# Patient Record
Sex: Male | Born: 1960 | Race: White | Hispanic: No | Marital: Married | Smoking: Former smoker
Health system: Southern US, Community
[De-identification: ages and names within clinical notes are randomized; demographics above are authoritative.]

## PROBLEM LIST (undated history)

## (undated) DIAGNOSIS — Z87442 Personal history of urinary calculi: Secondary | ICD-10-CM

## (undated) DIAGNOSIS — M1990 Unspecified osteoarthritis, unspecified site: Secondary | ICD-10-CM

## (undated) DIAGNOSIS — K5900 Constipation, unspecified: Secondary | ICD-10-CM

## (undated) DIAGNOSIS — K5090 Crohn's disease, unspecified, without complications: Secondary | ICD-10-CM

## (undated) DIAGNOSIS — Z8719 Personal history of other diseases of the digestive system: Secondary | ICD-10-CM

## (undated) DIAGNOSIS — I10 Essential (primary) hypertension: Secondary | ICD-10-CM

## (undated) DIAGNOSIS — K625 Hemorrhage of anus and rectum: Secondary | ICD-10-CM

## (undated) DIAGNOSIS — K589 Irritable bowel syndrome without diarrhea: Secondary | ICD-10-CM

## (undated) DIAGNOSIS — K529 Noninfective gastroenteritis and colitis, unspecified: Secondary | ICD-10-CM

## (undated) DIAGNOSIS — J189 Pneumonia, unspecified organism: Secondary | ICD-10-CM

## (undated) DIAGNOSIS — E119 Type 2 diabetes mellitus without complications: Secondary | ICD-10-CM

## (undated) DIAGNOSIS — K624 Stenosis of anus and rectum: Secondary | ICD-10-CM

## (undated) DIAGNOSIS — I209 Angina pectoris, unspecified: Secondary | ICD-10-CM

## (undated) HISTORY — DX: Noninfective gastroenteritis and colitis, unspecified: K52.9

## (undated) HISTORY — DX: Stenosis of anus and rectum: K62.4

## (undated) HISTORY — DX: Hemorrhage of anus and rectum: K62.5

## (undated) HISTORY — PX: NECK SURGERY: SHX720

## (undated) HISTORY — PX: CARDIAC CATHETERIZATION: SHX172

## (undated) HISTORY — DX: Irritable bowel syndrome, unspecified: K58.9

## (undated) HISTORY — PX: BACK SURGERY: SHX140

## (undated) HISTORY — PX: JOINT REPLACEMENT: SHX530

## (undated) HISTORY — DX: Constipation, unspecified: K59.00

## (undated) HISTORY — DX: Crohn's disease, unspecified, without complications: K50.90

---

## 1991-04-12 DIAGNOSIS — K5090 Crohn's disease, unspecified, without complications: Secondary | ICD-10-CM

## 1991-04-12 HISTORY — DX: Crohn's disease, unspecified, without complications: K50.90

## 1998-04-11 HISTORY — PX: BACK SURGERY: SHX140

## 1998-05-26 ENCOUNTER — Encounter: Payer: Self-pay | Admitting: Neurosurgery

## 1998-05-28 ENCOUNTER — Inpatient Hospital Stay (HOSPITAL_COMMUNITY): Admission: RE | Admit: 1998-05-28 | Discharge: 1998-05-29 | Payer: Self-pay | Admitting: Neurosurgery

## 1998-05-28 ENCOUNTER — Encounter: Payer: Self-pay | Admitting: Neurosurgery

## 2002-06-23 ENCOUNTER — Ambulatory Visit (HOSPITAL_COMMUNITY): Admission: RE | Admit: 2002-06-23 | Discharge: 2002-06-23 | Payer: Self-pay | Admitting: *Deleted

## 2002-06-23 ENCOUNTER — Encounter: Payer: Self-pay | Admitting: *Deleted

## 2003-01-20 ENCOUNTER — Emergency Department (HOSPITAL_COMMUNITY): Admission: EM | Admit: 2003-01-20 | Discharge: 2003-01-20 | Payer: Self-pay | Admitting: Emergency Medicine

## 2003-04-03 ENCOUNTER — Inpatient Hospital Stay (HOSPITAL_COMMUNITY): Admission: RE | Admit: 2003-04-03 | Discharge: 2003-04-04 | Payer: Self-pay | Admitting: Neurosurgery

## 2003-04-12 HISTORY — PX: NECK SURGERY: SHX720

## 2004-05-12 HISTORY — PX: COLONOSCOPY: SHX174

## 2004-05-31 ENCOUNTER — Encounter (INDEPENDENT_AMBULATORY_CARE_PROVIDER_SITE_OTHER): Payer: Self-pay | Admitting: Specialist

## 2004-05-31 ENCOUNTER — Ambulatory Visit (HOSPITAL_COMMUNITY): Admission: RE | Admit: 2004-05-31 | Discharge: 2004-05-31 | Payer: Self-pay | Admitting: Gastroenterology

## 2005-11-14 ENCOUNTER — Ambulatory Visit: Payer: Self-pay | Admitting: Internal Medicine

## 2006-06-07 ENCOUNTER — Ambulatory Visit: Payer: Self-pay | Admitting: Internal Medicine

## 2007-02-19 ENCOUNTER — Ambulatory Visit: Payer: Self-pay | Admitting: Cardiology

## 2007-03-20 ENCOUNTER — Ambulatory Visit: Payer: Self-pay

## 2007-03-20 ENCOUNTER — Ambulatory Visit: Payer: Self-pay | Admitting: Cardiology

## 2007-06-06 ENCOUNTER — Ambulatory Visit: Payer: Self-pay | Admitting: Internal Medicine

## 2008-04-11 HISTORY — PX: ABDOMINAL HERNIA REPAIR: SHX539

## 2008-04-11 HISTORY — PX: KNEE SURGERY: SHX244

## 2008-06-26 DIAGNOSIS — K5090 Crohn's disease, unspecified, without complications: Secondary | ICD-10-CM | POA: Insufficient documentation

## 2008-06-26 DIAGNOSIS — K5900 Constipation, unspecified: Secondary | ICD-10-CM | POA: Insufficient documentation

## 2008-06-26 DIAGNOSIS — Z8719 Personal history of other diseases of the digestive system: Secondary | ICD-10-CM | POA: Insufficient documentation

## 2008-06-26 DIAGNOSIS — K624 Stenosis of anus and rectum: Secondary | ICD-10-CM | POA: Insufficient documentation

## 2008-06-26 DIAGNOSIS — K625 Hemorrhage of anus and rectum: Secondary | ICD-10-CM | POA: Insufficient documentation

## 2008-06-26 DIAGNOSIS — K5289 Other specified noninfective gastroenteritis and colitis: Secondary | ICD-10-CM | POA: Insufficient documentation

## 2008-06-27 ENCOUNTER — Ambulatory Visit: Payer: Self-pay | Admitting: Internal Medicine

## 2008-06-30 ENCOUNTER — Encounter: Payer: Self-pay | Admitting: Internal Medicine

## 2008-07-15 ENCOUNTER — Telehealth (INDEPENDENT_AMBULATORY_CARE_PROVIDER_SITE_OTHER): Payer: Self-pay

## 2008-07-18 ENCOUNTER — Telehealth (INDEPENDENT_AMBULATORY_CARE_PROVIDER_SITE_OTHER): Payer: Self-pay | Admitting: *Deleted

## 2008-07-28 ENCOUNTER — Encounter: Admission: RE | Admit: 2008-07-28 | Discharge: 2008-10-08 | Payer: Self-pay | Admitting: Orthopedic Surgery

## 2008-08-13 ENCOUNTER — Encounter: Payer: Self-pay | Admitting: Internal Medicine

## 2008-08-20 ENCOUNTER — Ambulatory Visit: Payer: Self-pay | Admitting: Internal Medicine

## 2008-08-20 ENCOUNTER — Encounter: Payer: Self-pay | Admitting: Internal Medicine

## 2008-08-20 ENCOUNTER — Ambulatory Visit (HOSPITAL_COMMUNITY): Admission: RE | Admit: 2008-08-20 | Discharge: 2008-08-20 | Payer: Self-pay | Admitting: Internal Medicine

## 2008-08-20 HISTORY — PX: COLONOSCOPY: SHX174

## 2008-08-25 ENCOUNTER — Encounter: Payer: Self-pay | Admitting: Internal Medicine

## 2008-08-26 ENCOUNTER — Encounter: Payer: Self-pay | Admitting: Internal Medicine

## 2008-08-26 LAB — CONVERTED CEMR LAB
BUN: 9 mg/dL (ref 6–23)
Basophils Absolute: 0 10*3/uL (ref 0.0–0.1)
Basophils Relative: 0 % (ref 0–1)
CO2: 26 meq/L (ref 19–32)
Calcium: 9.3 mg/dL (ref 8.4–10.5)
Chloride: 105 meq/L (ref 96–112)
Creatinine, Ser: 0.87 mg/dL (ref 0.40–1.50)
Eosinophils Absolute: 0.1 10*3/uL (ref 0.0–0.7)
Eosinophils Relative: 1 % (ref 0–5)
Glucose, Bld: 78 mg/dL (ref 70–99)
HCT: 43.7 % (ref 39.0–52.0)
Hemoglobin: 14.4 g/dL (ref 13.0–17.0)
Lymphocytes Relative: 27 % (ref 12–46)
Lymphs Abs: 1.9 10*3/uL (ref 0.7–4.0)
MCHC: 33 g/dL (ref 30.0–36.0)
MCV: 89.4 fL (ref 78.0–100.0)
Monocytes Absolute: 0.7 10*3/uL (ref 0.1–1.0)
Monocytes Relative: 10 % (ref 3–12)
Neutro Abs: 4.3 10*3/uL (ref 1.7–7.7)
Neutrophils Relative %: 61 % (ref 43–77)
Platelets: 273 10*3/uL (ref 150–400)
Potassium: 4.4 meq/L (ref 3.5–5.3)
RBC: 4.89 M/uL (ref 4.22–5.81)
RDW: 12.9 % (ref 11.5–15.5)
Sodium: 143 meq/L (ref 135–145)
WBC: 7 10*3/uL (ref 4.0–10.5)

## 2008-09-18 ENCOUNTER — Encounter: Payer: Self-pay | Admitting: Internal Medicine

## 2008-10-22 ENCOUNTER — Ambulatory Visit: Payer: Self-pay | Admitting: Internal Medicine

## 2008-10-24 ENCOUNTER — Encounter: Payer: Self-pay | Admitting: Gastroenterology

## 2009-02-11 ENCOUNTER — Ambulatory Visit (HOSPITAL_COMMUNITY): Admission: RE | Admit: 2009-02-11 | Discharge: 2009-02-11 | Payer: Self-pay | Admitting: General Surgery

## 2009-05-13 ENCOUNTER — Ambulatory Visit: Payer: Self-pay | Admitting: Internal Medicine

## 2009-06-17 ENCOUNTER — Ambulatory Visit: Payer: Self-pay | Admitting: Internal Medicine

## 2009-09-01 ENCOUNTER — Ambulatory Visit: Payer: Self-pay | Admitting: Internal Medicine

## 2009-09-03 ENCOUNTER — Encounter: Payer: Self-pay | Admitting: Internal Medicine

## 2010-01-27 ENCOUNTER — Encounter (INDEPENDENT_AMBULATORY_CARE_PROVIDER_SITE_OTHER): Payer: Self-pay

## 2010-03-09 ENCOUNTER — Ambulatory Visit: Payer: Self-pay | Admitting: Cardiology

## 2010-03-18 ENCOUNTER — Ambulatory Visit: Payer: Self-pay | Admitting: Internal Medicine

## 2010-03-19 ENCOUNTER — Encounter: Payer: Self-pay | Admitting: Gastroenterology

## 2010-05-11 ENCOUNTER — Encounter (INDEPENDENT_AMBULATORY_CARE_PROVIDER_SITE_OTHER): Payer: Self-pay | Admitting: *Deleted

## 2010-05-13 NOTE — Assessment & Plan Note (Signed)
Summary: 6 MONTH F/U CROHNS/LAW   Visit Type:  Follow-up Visit Primary Care Provider:  Octavio Graves  Chief Complaint:  F/U crohns.  History of Present Illness: 50 year old gentleman with Crohn's colitis here for 6 month followup. He's done well having 4-5 formed bowel movements daily on lialda 4.8 g daily. He has history of anal stenosis that has not been an issue. He feels he is doing very well. Has not used MiraLax or any other laxative over the past 6 months. No blood per rectum. No bowel pain and nausea or vomiting. Had a problem with elevated blood pressure 2 weeks ago with what sounds like mental status changes which led to a three-day hospitalization at [facility]. He has not had any recent labs here; will retrieve labs from hospitalization.   Current Medications (verified): 1)  Percocet 5-325 Mg Tabs (Oxycodone-Acetaminophen) .... Take As Needed 2)  Miralax  Powd (Polyethylene Glycol 3350) .... As Needed 3)  Lialda 1.2 Gm Tbec (Mesalamine) .... Four Tablets in The Am 4)  Lisinopril 20 Mg Tabs (Lisinopril) .... Take 1 Tablet By Mouth Once A Day  Allergies (verified): No Known Drug Allergies  Past History:  Past Medical History: Last updated: 06/26/2008 IRRITABLE BOWEL SYNDROME, HX OF (ICD-V12.79) COLITIS (ICD-558.9) RECTAL BLEEDING (ICD-569.3) UNSPECIFIED CONSTIPATION (ICD-564.00) ANAL STENOSIS (ICD-569.2) CROHN'S DISEASE (ICD-555.9)  Past Surgical History: Last updated: 05/13/2009 Back surgery Neck surgery Abdominal hernia   2010 Left knee Artrhoscopic surg  2010  Family History: Last updated: 06/27/2008 Father: living / CHF Mother: living /CHF and DM Siblings: 4 One brother deceased   Social History: Last updated: 06/27/2008 Marital Status: Married Children: 1 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  Alcohol Use - yes  Risk Factors: Smoking Status: quit (06/27/2008)  Vital Signs:  Patient profile:   50 year old male Height:       72 inches Weight:      293 pounds BMI:     39.88 Temp:     98.4 degrees F oral Pulse rate:   72 / minute BP sitting:   120 / 78  (left arm) Cuff size:   large  Vitals Entered By: Waldon Merl LPN (March 19, 6807 4:08 PM)  Physical Exam  General:  pleasant gentleman resting comfortably in no acute distress Lungs:  clear to auscultation  Heart:  rate rhythm without murmur gallop or Abdomen:  obese.nondistended positive bowel sounds soft nontender without appreciable mass or organomegaly  Impression & Recommendations: Impression: Patient with Crohn's colitis in remission on off label use of lialda. Anal stenosis asymptomatic. Overall I feel the patient is doing very well with his current regimen.  Recommendations: Continue lialda for 4.8 g daily. I would like to retrieve the labs done when he was hospitalized recently just to make sure that things are in line including serum creatinine. Assuming everything is okay, we'll plan to see him back in the office in one year.  Appended Document: Orders Update    Clinical Lists Changes  Orders: Added new Service order of Est. Patient Level III (81103) - Signed

## 2010-05-13 NOTE — Letter (Signed)
Summary: NOTES FROM PATIENT  NOTES FROM PATIENT   Imported By: Diana Eves 06/17/2009 16:32:00  _____________________________________________________________________  External Attachment:    Type:   Image     Comment:   External Document

## 2010-05-13 NOTE — Assessment & Plan Note (Signed)
Summary: fu ov 6 mo,crohn's disease/ams   Visit Type:  Follow-up Visit Primary Care Provider:  Samuel Jester  Chief Complaint:  F/U crohn's.  History of Present Illness:  50 year old gentleman with prior colonoscopy demonstrated the ileocecal valve/ileal ulcers in distal rectal inflammation suspicious for Crohn's disease. He was started on lialda last year but is come off that medication for takes Percocet periodically for his back.  He has become progressively constipated. Denies any blood per rectum or any diarrhea, whatsoever. He has a history of anal stenosis and hast dilator at home a physician gave him over at [facility]; he wonders if he doesn't need another dilation at this time.  had umilical hernia repaired by Dr. Lovell Sheehan since he wa last here.   Current Medications (verified): 1)  Percocet 5-325 Mg Tabs (Oxycodone-Acetaminophen) .... 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  Alcohol Use - yes  Past Surgical History: Back surgery Neck surgery Abdominal hernia   2010 Left knee Artrhoscopic surg  2010  Vital Signs:  Patient profile:   50 year old male Height:      72 inches Weight:      272 pounds BMI:     37.02 Temp:     98.2 degrees F oral Pulse rate:   80 / minute BP sitting:   118 / 62  (right arm) Cuff size:   regular  Vitals Entered By: Cloria Spring LPN (May 13, 2009 3:46 PM)  Physical Exam  General:  alert  /conversant and comfortable-appearing Eyes:  no scleral icterus. Lungs:  clear to auscultation Heart:  regular rate and rhythm without murmur gallop rub Abdomen:  nondistended positive bowel sounds well-healing umbilical hernia repair site identified. Soft nontender without appreciable mass or organomegaly Rectal:  no external lesions. He does have a relatively tight anal canal and digital exam was uncomfortable. No mass rectal vault scant brown stool present  Impression & Recommendations: 50 year old gentleman with endoscopic and histologic evidence concerning for Crohn's disease, currently on no anti-inflammatory therapy. His chief complaint is that of constipation. He does have some degree of anal stenosis. I am more concerned about his use of Percocet could be constipation having a bowel obstruction. Findings of the prior colonoscopy last year otherwise reassuring.   At this time, recommended he go on a laxative in the way of MiraLax one capful or 17 g orally at bedtime in 8 ounces of water. He's to do this nightly p.r.n. no bowel movement on any given day. Furthermore,  he is to  keep a stool diary. Will hold off on re\re instituting lialda for the time being. We'll have him come in to see [place] in one month; we'll reassess him and discuss  further management.  Other Orders: Est. Patient Level III (04540)

## 2010-05-13 NOTE — Miscellaneous (Signed)
Summary: Orders Update  Clinical Lists Changes  Orders: Added new Test order of T-Basic Metabolic Panel ([id number]) - Signed  

## 2010-05-13 NOTE — Letter (Signed)
Summary: Recall, Labs Needed  [facility]  [address]   [place], [place] [address]   Phone: [phone]  Fax: [phone]    January 27, 2010  Ricky Blackwell [address] [place], [place]  [address] May 08, 1960   Dear [name],   [place] records indicate it is time to repeat your blood work.  You can take the enclosed form to the lab on or near the date indicated.  Please make note of the new location of the lab:   [place], 2nd floor   [place] office will call you within a week to ten business days with the results.  If you do not hear from [place] in 10 business days, you should call the office.  If you have any questions regarding this, call the office at [phone], and ask for the nurse.  Labs are due on 03/02/2010.   Sincerely,    Burnadette Peter LPN  [facility] Ph: [phone]   Fax: [phone]

## 2010-05-13 NOTE — Medication Information (Signed)
Summary: Theodosia Blender 1.2GM  LIALDA 1.2GM   Imported By: Rexene Alberts 03/19/2010 15:35:03  _____________________________________________________________________  External Attachment:    Type:   Image     Comment:   External Document  Appended Document: LIALDA 1.2GM    Prescriptions: LIALDA 1.2 GM TBEC (MESALAMINE) Four tablets in the AM  #120 x 3   Entered and Authorized by:   Gerrit Halls NP   Signed by:   Gerrit Halls NP on 03/22/2010   Method used:   Faxed to ...       [facility]* (retail)       [address]       [place], [place]  [address]       Ph: [phone]       Fax: [phone]   RxID:   [id number]

## 2010-05-13 NOTE — Assessment & Plan Note (Signed)
Summary: follow up -cdg   Visit Type:  Follow-up Visit Primary Care Provider:  Aram Beecham butler  Chief Complaint:  F/U .  History of Present Illness: 50 year old gentleman with ileocolonic Crohn's disease and some proctitis return for followup. He has a history of anal stenosis. He did not feel he was evacuating adequately when seen previously. we bumped up Lialda to the full dose  - i.e. 4.8 g daily. This has been associated with normalization of bowel function;  still has upwards of 5 BM's daily. Not passing any blood; he feels he's  is doing well and is very happy. He is not having any abdominal pain. His creatinine was normal on assay last year.    Current Medications (verified): 1)  Percocet 5-325 Mg Tabs (Oxycodone-Acetaminophen) .... Take As Needed 2)  Miralax  Powd (Polyethylene Glycol 3350) .... As Needed 3)  Lialda 1.2 Gm Tbec (Mesalamine) .... Four Tablets in The Am  Allergies (verified): No Known Drug Allergies  Past History:  Past Medical History: Last updated: 06/26/2008 IRRITABLE BOWEL SYNDROME, HX OF (ICD-V12.79) COLITIS (ICD-558.9) RECTAL BLEEDING (ICD-569.3) UNSPECIFIED CONSTIPATION (ICD-564.00) ANAL STENOSIS (ICD-569.2) CROHN'S DISEASE (ICD-555.9)  Past Surgical History: Last updated: 05/13/2009 Back surgery Neck surgery Abdominal hernia   2010 Left knee Artrhoscopic surg  2010  Family History: Last updated: 06/27/2008 Father: living / CHF Mother: living /CHF and DM Siblings: 4 One brother deceased   Social History: Last updated: 06/27/2008 Marital Status: Married Children: 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  Alcohol Use - yes  Risk Factors: Smoking Status: quit (06/27/2008)  Vital Signs:  Patient profile:   50 year old male Height:      72 inches Weight:      285 pounds BMI:     38.79 Temp:     98.0 degrees F oral Pulse rate:   80 / minute BP sitting:   130 / 70  (left arm) Cuff size:   large  Vitals  Entered By: Cloria Spring LPN (Sep 01, 2009 4:02 PM)  Physical Exam  General:  alert conversant no acute distress Abdomen:  obese positive bowel sounds soft and entirely nontender without appreciable mass or organomegaly  Impression & Recommendations: Impression: 50 year old general ileocolonic Crohn's disease. History of anal stenosis. GI symptoms had become  quiscent on full dose mesalamine therapy in the way of lialda. He's please with his progress as I am at this time.  Recommendations: Continue lialda for 4.8 g daily. Plan see him back in 6 months; we'll check a be met at that time. If he has any interim problems, he is to call.  Appended Document: Orders Update    Clinical Lists Changes  Orders: Added new Service order of Est. Patient Level III ([address]) - Signed      Appended Document: follow up -cdg reminder in computer

## 2010-05-13 NOTE — Assessment & Plan Note (Signed)
Summary: fu 42mo ov,chrons,gu   Visit Type:  Follow-up Visit Primary Care Provider:  butler  Chief Complaint:  follow up- doing ok.  History of Present Illness: 50 year old gentleman with ileocolonic Crohn's disease ( predominantly left-sided colonic) and anal stenosis. Here for followup. He's currently on no anti-inflammatory therapy for his bowels. He relates more constipation when he was here last year. Using MiraLax nightly -  has upwards on average of 6-8 bowel movements daily;  passes bowel movements the size of his pinky. He is goning w/o any  recent anal dilations because of inconvenience.   He noted when he has dilated previously,  he had larger less frequent bowel movements.  Not having any liquid bowel movements; he denies true diarrhea. There is no blood per rectum. He previously saw Dr. Byrd Hesselbach over [place] who got him the  dilator.  He has been  2-3 years since he last saw Dr. Byrd Hesselbach. I found no significant abnormality on digital rectal exam or colonoscopy aside from the inflamed mucosa consistent Crohn's last year. He is gaining weight. He has not had any upper GI tract symptoms.  I have reviewed his stool diary as outlined above.  Current Problems (verified): 1)  Irritable Bowel Syndrome, Hx of  (ICD-V12.79) 2)  Colitis  (ICD-558.9) 3)  Rectal Bleeding  (ICD-569.3) 4)  Unspecified Constipation  (ICD-564.00) 5)  Anal Stenosis  (ICD-569.2) 6)  Crohn's Disease  (ICD-555.9)  Current Medications (verified): 1)  Percocet 5-325 Mg Tabs (Oxycodone-Acetaminophen) .... Take As Needed 2)  Miralax  Powd (Polyethylene Glycol 3350) .... Once Daily  Allergies (verified): No Known Drug Allergies  Past History:  Past Medical History: Last updated: 06/26/2008 IRRITABLE BOWEL SYNDROME, HX OF (ICD-V12.79) COLITIS (ICD-558.9) RECTAL BLEEDING (ICD-569.3) UNSPECIFIED CONSTIPATION (ICD-564.00) ANAL STENOSIS (ICD-569.2) CROHN'S DISEASE (ICD-555.9)  Past Surgical History: Last updated:  05/13/2009 Back surgery Neck surgery Abdominal hernia   2010 Left knee Artrhoscopic surg  2010  Family History: Last updated: 06/27/2008 Father: living / CHF Mother: living /CHF and DM Siblings: 4 One brother deceased   Social History: Last updated: 06/27/2008 Marital Status: Married Children: 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 Abdomen:  detaile exam deferred.  Impression & Recommendations: Impression :  Ileocolonic Crohn's disease with an element of proctitis. Currently not on any anti-inflammatory therapy. He previously was on lialda. He has a history of anal stenosis.  He has multiple bowel movements daily taking MiraLax at bedtime. I'm not sure he truly has much in the way any diarrhea at  this time.  Symtpoms somewhat difficult to sort as far as the degree of any inflammatory bowel disease activity  symptoms versus  relative outlet obstruction symptoms.  I feel he should be on a good dose of anti-inflammatory therapy to squelch any IBD  symptoms he may be currently having. He may well be taking too much  MiraLax . He may need to see Dr. Byrd Hesselbach again regarding his anal stenosis.  I'd like to optimize treatment for  his inflammatory bowel disease before going in that direction.   Recommendations:   resume Lialda at a full dose of 4.8 g orally daily; he is to continue  keeping a  stool diarrhea. Would decrease / minimize use of MiraLax - would try to avoid MiraLax, in fact, if he has at  least couple bowel movements daily. Plan to see him back in 6 weeks and we'll make a decision about getting back or to see Dr. Mirian Mo at [facility].  Appended Document: Orders Update    Clinical Lists Changes  Orders: Added new Service order of Est. Patient Level III ([address]) - Signed

## 2010-05-19 NOTE — Letter (Signed)
Summary: Recall Office Visit  [facility]  [address]   [place], [place] [address]   Phone: [phone]  Fax: [phone]      May 11, 2010   Ricky Blackwell [address] [place], [place]  [address] August 04, 1960   Dear [name],   According to our records, it is time for you to schedule a follow-up office visit with [place].   At your convenience, please call [phone] to schedule an office visit. If you have any questions, concerns, or feel that this letter is in error, we would appreciate your call.   Sincerely,    Diana Eves  [facility] Ph: [phone]   Fax: [phone]

## 2010-07-14 LAB — BASIC METABOLIC PANEL
CO2: 27 mEq/L (ref 19–32)
Calcium: 8.9 mg/dL (ref 8.4–10.5)
Creatinine, Ser: 0.65 mg/dL (ref 0.4–1.5)
GFR calc Af Amer: >60 mL/min (ref >60)
GFR calc non Af Amer: >60 mL/min (ref >60)

## 2010-07-14 LAB — CBC
MCHC: 34.2 g/dL (ref 30.0–36.0)
RBC: 4.62 MIL/uL (ref 4.22–5.81)

## 2010-07-20 ENCOUNTER — Other Ambulatory Visit: Payer: Self-pay

## 2010-07-21 MED ORDER — MESALAMINE 1.2 G PO TBEC: 1.2000 g | DELAYED_RELEASE_TABLET | Freq: Four times a day (QID) | ORAL | Status: DC

## 2010-07-21 NOTE — Telephone Encounter (Signed)
Rx called to [facility] in [place].

## 2010-08-24 NOTE — Op Note (Signed)
NAME:  OAK, DOREY NO.:  [id number]   MEDICAL RECORD NO.:  [id number]          PATIENT TYPE:  AMB   LOCATION:  DAY                           FACILITY:  [facility]   PHYSICIAN:  R. Garfield Cornea, M.D. DATE OF BIRTH:  May 23, 1960   DATE OF PROCEDURE:  08/20/2008  DATE OF DISCHARGE:                               OPERATIVE REPORT   PROCEDURE:  Ileocolonoscopy with biopsy.   INDICATIONS FOR PROCEDURE:  This 6-year gentleman carries a diagnosis  of Crohn's colitis, based on prior colonoscopies done with [facility] in [place].  His last exam was 2006, done by Teena Irani.  He describes some abnormalities in the left colon.  Biopsies  were done, but in spite of our best efforts, we never got any path  report.  He describes some abnormalities in the distal transverse colon  and some in the descending segment.  Mr. Shular has 5-6 bowel movements  daily.  He denies blood per rectum.  He has been on Colazal along the  way, but stopped taking this agent a couple of months ago because he  felt it was constipating him.  No family history of GI neoplasia.  He is  currently taking note IBD meds at this time.  Colonoscopy is now being  done.  Risks, benefits, alternatives and limitations have been  discussed, questions answered.  Please see documentation in the medical  record.   PROCEDURE NOTE:  O2 saturation, blood pressure, pulse, respiration were  monitored throughout entire procedure.   CONSCIOUS SEDATION:  Versed 5 mg IV, Demerol 125 mg IV, in divided  doses.   INSTRUMENT:  Pentax video chip system.   FINDINGS:  Digital rectal exam revealed no abnormalities on scout  findings.  The prep was adequate.  Colon:  Colonic mucosa was surveyed  from the rectosigmoid junction through the left transverse, right colon  to the appendiceal orifice, ileocecal valve, cecum.  These structures  were well-seen, photographed for the record.  Terminal ileum was  intubated to  5 cm.  From this level scope was slowly and cautiously  withdrawn.  All previously-mentioned mucosal surfaces were again seen.  The patient was noted to have a couple of small aphthous type ulcers on  the lips of the ileocecal valve.  The distal terminal ileum otherwise  appeared entirely normal.  This area was biopsied.  The ascending  segment appeared entirely normal.  The transverse segment appeared  normal.  Examination of the descending segment revealed normal-appearing  mucosa.  I elected to go ahead and do segmental biopsies in this  segment, pulled the scope on down into the sigmoid.  The sigmoid mucosa  appeared normal until the distal sigmoid was reached.  There were  scattered 1-2 mm aphthous-appearing ulcers in the distal sigmoid, which  extended on down to the proximal rectum.  These areas of ulceration were  discrete and the vast majority of the colonic mucosa appeared entirely  normal.  The aphthous ulcers were clustered in the distal sigmoid and  proximal rectum.  The distal rectum appeared entirely normal with  thorough examination, including retroflexion.  Biopsies of the sigmoid  and proximal rectal mucosa were taken for histologic study, as well.  Again, the small ulcers at the ileocecal valve were biopsied for  histologic study.   The patient tolerated the procedure well and was reacted in endoscopy.  Cecal withdrawal time 18 minutes.   IMPRESSION:  Normal distal rectum.  Proximal rectum, scattered 1-2-mm  aphthous ulcers.  These extended into the distal sigmoid.  The colonic  mucosa from the mid-sigmoid all the way around to the cecum appeared  entirely normal.  There were some small aphthous ulcerations at the  mouth of the ileocecal valve as described above.  Further up, the distal  terminal ileal mucosa appeared normal, status post segmental biopsies as  described above.   RECOMMENDATIONS:  We will follow up on path and make further  recommendations in the  very near future.      Bridgette Habermann, M.D.  Electronically Signed     RMR/MEDQ  D:  08/20/2008  T:  08/20/2008  Job:  [id number]   cc:   Octavio Graves  Fax: [phone]

## 2010-08-24 NOTE — Assessment & Plan Note (Signed)
NAME:  Ricky Blackwell, Ricky Blackwell                CHART#:  [id number]   DATE:  06/06/2007                       DOB:  October 07, 1960   Today's followup Crohn's colitis by history.   The patient returns for 1-year followup.  Last seen 06/07/2006.  This  gentleman gives a history of Crohn's colitis going back to 1994 when he  was diagnosed by Dr. Clarene Essex.  Had subsequent followup colonoscopy by  Dr. Amedeo Plenty around 2006.  On 2 different occasions, we attempted to get  records from the folks down in [place], but have not gotten 1 piece  of paper yet to corroborate the diagnosis of Crohn's disease.  No path,  et Ronney Asters.  The patient has been on Colazal 2.25 g t.i.d. now for some  time, and he tells me he has greater than 90% good days in any given  month.  He has 5 to 6 formed bowel movements daily on good days, 10-15  on bad days with diarrhea.  He has not had any blood per rectum.  Has  not had any significant abdominal pain.  He denies odynophagia,  dysphagia, early satiety, or reflux symptoms, nausea or vomiting.  He  has been able to lose some weight.  He has dropped 19 pounds since  06/07/2006, adopting a healthy lifestyle and exercise program.  There is  no family history of GI neoplasia.   CURRENT MEDICATIONS:  See updated list.   ALLERGIES:  No known drug allergies.   EXAM:  He looks well.  Weight is 266, height 6 feet, temperature 98, BP 114/82, pulse 80.  SKIN:  Warm and dry.  CHEST:  Lungs clear to auscultation.  CARDIAC:  Regular rate and rhythm without murmur, gallop, or rub.  ABDOMEN:  Nondistended.  Positive bowel sounds.  Soft, nontender.  No  masses or hepatosplenomegaly.   ASSESSMENT:  Crohn's colitis by history.  I have no corroborating data  to support the diagnosis.  I certainly have no doubt of diagnosis at  this time, but I would very much like to see results of studies done  previously along with path.  We did ask the patient to get a BMET one  year ago, but he did not  follow through.   RECOMMENDATIONS:  Continue Colazal for the time-being.  Will do  everything we can to get the records in the very near future for review.  Will get a CBC and a BMET now.  Will make further recommendations in the  very near future.       Bridgette Habermann, M.D.  Electronically Signed     RMR/MEDQ  D:  06/06/2007  T:  06/07/2007  Job:  [id number]   cc:   Octavio Graves

## 2010-08-24 NOTE — Assessment & Plan Note (Signed)
[facility] OFFICE NOTE   NAME:Ricky Blackwell, Ricky Blackwell                      MRN:          [id number]  DATE:02/19/2007                            DOB:          [birth year]    REFERRING PHYSICIAN:  Haynes Hoehn, NP   PRIMARY CARE PHYSICIAN:  Dr. Morrie Sheldon.   REFERRING PHYSICIAN:  Nicanor Bake , NP.   REASON FOR PRESENTATION:  A patient with chest pain.   HISTORY OF PRESENT ILLNESS:  The patient is a pleasant 50 year old  gentleman without prior cardiac history.  He developed chest pain about  3 weeks ago.  This was an anterior discomfort.  It was kind of a heavy  discomfort.  He did notice it after picking up something heavy in his  yard.  There is no associated nausea, vomiting or diaphoresis.  It was  moderate in intensity.  He has now had radiation to his axilla that he  just noticed this weekend.  He was doing some other work, cleaning his  car.  He notices that this discomfort is reproducible with palpation.  He denies any jaw discomfort or arm discomfort.  He is active at work.  Does not bring on any of these symptoms or make the discomfort worse.  He has not been taking anything to try to make it go away.  He does get  short of breath walking stairs, but this has been slowly progressive  over time.  He is not describing any resting shortness of breath.  Denies any PND or orthopnea.  He blames the shortness of breath on his  obesity.   PAST MEDICAL HISTORY:  1. Crohn disease.  2. Nephrolithiasis.   PAST SURGICAL HISTORY:  Back surgery.   ALLERGIES:  None.   MEDICATIONS:  1. Skelaxin 800 mg b.i.d.  2. Etodolac 500 mg b.i.d.  3. Oxycodone.  4. Flexeril.  5. Balsalazide 750 mg 9 tablets daily.   SOCIAL HISTORY:  The patient works in a [employer].  He does a lot of  loading.  He is married.  He has 1 child.  He quit smoking 20 years ago  after 2 packs per day for 8 years.   FAMILY HISTORY:  Noncontributory  for early coronary disease.  Both of  his parents have heart failure at a later age.  His dad has sleep apnea.  He has a sister with sleep apnea as well.   REVIEW OF SYSTEMS:  As stated in the HPI and positive for daytime  somnolence, dizziness,  headaches.  Negative for other systems as  described.   PHYSICAL EXAMINATION:  The patient is in no distress.  Blood pressure 149/90, heart rate 65 and regular, weight 286 pounds.  HEENT:  Eyes unremarkable.  Pupils equal, round, react to light.  Fundi  not visualized.  Oral mucosa unremarkable.  NECK:  No jugular venous distention.  Waveform within normal limits.  Carotid upstroke brisk and symmetric.  No bruits.  No thyromegaly.  LYMPHATICS:  No cervical, axillary, inguinal adenopathy.  LUNGS:  Clear to auscultation bilaterally.  BACK:  No  costovertebral angle tenderness.  CHEST:  Unremarkable.  HEART:  PMI not displaced, it was sustained.  S1 and S2 within normal  limits; no S3, no S4.  No clicks, no rubs, no murmurs.  ABDOMEN:  Obese; positive bowel sounds.  Normal frequency and pitch.  No  bruits, rebound, guarding.  No midline pulsatile mass.  No hepatomegaly,  splenomegaly.  SKIN:  No rashes, no nodules.  EXTREMITIES:  2+ pulses throughout.  No edema, no cyanosis, no clubbing.  NEURO:  Oriented to person, place and time.  Cranial nerves II-XII  grossly intact.  Motor grossly intact.   HISTORY OF PRESENT ILLNESS:  1. The patient's chest discomfort is predominantly atypical.  He has      cardiovascular risk factors.  I think it is a low pre-test      probability for obstructive coronary disease.  We do think he needs      an exercise treadmill test.  This will also allow me to judge the      level of dyspnea, as well as his blood pressure response with      activity.  Further evaluation of his dyspnea will be based on these      results.  2. Hypertension.  The patient has a mildly elevated blood pressure.      This is the first time  I am seeing him and the first time he is      being told this.  Certainly he needs therapeutic lifestyle changes.      We will judge his blood pressure response with the treadmill.  He      may well have sleep apnea.  He has all of the symptoms.  I am going      to go ahead and order a sleep study.  3. Obesity.  He understands he needs to lose weight with diet and      exercise, and we will review this.  4. Risk reduction.  I will check to see if he has had a lipid profile.  5. Followup will be at the time of his treadmill.     Minus Breeding, MD, [facility]  Electronically Signed    JH/MedQ  DD: 02/19/2007  DT: 02/20/2007  Job #: [id number]   cc:   Haynes Hoehn, NP

## 2010-08-24 NOTE — Procedures (Signed)
[facility]                              EXERCISE TREADMILL   NAME:Ricky Blackwell, Ricky Blackwell                      MRN:          [id number]  DATE:03/20/2007                            DOB:          1960/08/31    REFERRING PHYSICIAN:  Laurance Flatten, M.D.   PROCEDURE:  Exercise treadmill test.   INDICATIONS:  Evaluate patient with chest pain and cardiovascular risk  factors.   PROCEDURE NOTE:  The patient was exercised using standard [facility]  protocol. He was able to exercise for 9 minutes which completed stage 3.  He achieved 10.4 METS. He achieved a peak heart rate of 160 which was  91% of predicted. He had an appropriate blood pressure response with a  maximum 195/78. Test was terminated because he achieved his target heart  rate. He had no chest pain. There were occasional premature ectopic  complexes at peak exercise but none in recovery. There were no ischemic  ST-T wave changes. He had an appropriate heart rate recovery.   CONCLUSION:  Negative adequate exercise treadmill test.   PLAN:  Based on the above, the patient does not have evidence of high-  grade obstructive coronary disease. He has a moderate exercise  tolerance. I gave him a prescription for exercise. I put in the low-risk  category for further cardiovascular events based on this finding along.  He certainly needs to exercise and manage his morbid obesity. He needs  follow up of his blood pressure. We are going to try to make an argument  for him to get a sleep study as his insurance company would only cover  40% of this.     Minus Breeding, MD, [facility]  Electronically Signed    JH/MedQ  DD: 03/20/2007  DT: 03/21/2007  Job #: [id number]   cc:   Chipper Herb, M.D.

## 2010-08-27 NOTE — Op Note (Signed)
NAME:  CORINTHIAN, MIZRAHI NO.:  [id number]   MEDICAL RECORD NO.:  [id number]                   PATIENT TYPE:  INP   LOCATION:  9675                                 FACILITY:  [facility]   PHYSICIAN:  Hosie Spangle, M.D.            DATE OF BIRTH:  Oct 19, 1960   DATE OF PROCEDURE:  04/03/2003  DATE OF DISCHARGE:  04/04/2003                                 OPERATIVE REPORT   PREOPERATIVE DIAGNOSIS:  C5-6 and C6-7 cervical disk herniation, cervical  spondylosis, cervical degenerative disk disease and cervical radiculopathy.   POSTOPERATIVE DIAGNOSIS:  C5-6 and C6-7 cervical disk herniation, cervical  spondylosis, cervical degenerative disk disease and cervical radiculopathy.   PROCEDURE:  C5-6 and C6-7 anterior cervical diskectomy and arthrodesis with  iliac crest allograft and tether cervical plating.   SURGEON:  Hosie Spangle, M.D.   ASSISTANT:  Leeroy Cha, M.D.   ANESTHESIA:  General endotracheal anesthesia.   INDICATIONS FOR PROCEDURE:  The patient is a 50 year old man who presented  with a left cervical radiculopathy and was found to have disk herniation  which extended to the length of both C5-6 and C6-7, superimposed upon  underlying cervical spondylosis and degenerative disk disease. The decision  was made to proceed with a 2-level anterior cervical diskectomy and  arthrodesis.   DESCRIPTION OF PROCEDURE:  The patient was brought to the operating room and  placed under general anesthesia. The patient was placed in 10 pounds of  halter traction and the neck was prepped with Betadine soap and solution and  draped in a sterile fashion. The line of the incision was treated with local  anesthesia with epinephrine.   A horizontal incision was made in the left side of the neck. Dissection was  carried down through the subcutaneous tissue and platysma and then  dissection was carried out through an avascular plane and in the  sternocleidomastoid, carotid artery and jugular vein were retracted  laterally and the trachea and esophagus medially. The ventral aspect of the  ventral aspect of the vertebral column was identified and localizing x-rays  were taken. The C5-6 and the C6-7 intravertebral disk space was identified.  An x-ray was taken to confirm the localization.   The annulus at each level was incised. The disk space was entered and the  diskectomy was performed using microcurets and pituitary rongeurs. The  cauterized the endplates of corresponding vertebra were removed using  microcurets along with the Stryker drill. The microscope was draped and  brought into the field to provide its magnification, illumination and  visualization, and the remainder of the decompression was performed using  microdissection and microsurgical technique.   Posterior osteophytic overgrowth at each level was removed using the Stryker  drill along with a 2-mm punch for the thin footplate. The posterior  longitudinal ligament  was carefully  opened and a disk herniation  encountered at each level and removed. Spondylitically thickened  posterior  longitudinal ligament  was opened and the spinal canal,  thecal sac,  foramina and nerve roots were decompressed bilaterally at each level.   Once the decompression was completed, hemostasis was established with the  use of Gelfoam soaked in thrombin. We then measured the height of each of  the intravertebral disk spaces with bone spacers as well as 7-mm grafts for  each level. These were hydrated in saline solution and positioned in the  intervertebral disk space and countersunk.   We then selected a 32-mm tether cervical plate. It was positioned over the  fusion construct and secured to the vertebra with 4 x 15 mm screws. We  placed a pair of C5 and another pair at C7 and a single screw at C6. Each  screw hole was drilled and tapped and a screw placed. Screws were placed in  an  alternating fashion. All 5 screws were placed and then final tightening  was done of all 5 screws.   An x-ray was performed. The view was somewhat limited due to his large  shoulders. The screws were in good position at C5 and C6 and the graft was  in good position at the C5-6 level. We could not see the graft at C6-7 nor  the screws at C7. However, under direct vision they appeared  in good  position. The wound was irrigated with Bacitracin solution. Successful  hemostasis was established and confirmed and then we proceeded with closure.   The platysma was closed with interrupted inverted 2-0  Vicryl undyed  sutures. The subcutaneous and subcuticular layer were closed with  interrupted inverted 3-0 undyed  Vicryl suture and the skin was  reapproximated with Dermabond.   The patient tolerated the procedure well. Estimated blood loss was 75 mL.  Following  surgery, the patient, whose traction had been discontinued once  the bone grafts were placed and prior to the plating, was placed in a soft  cervical collar, reversed from anesthetic, extubated and transferred to the  recovery room for further care where he was noted to be moving all 4  extremities to command.                                               Hosie Spangle, M.D.    RWN/MEDQ  D:  04/03/2003  T:  04/04/2003  Job:  [id number]

## 2010-08-27 NOTE — Op Note (Signed)
NAME:  Ricky Blackwell, BONSIGNORE NO.:  [id number]   MEDICAL RECORD NO.:  [id number]          PATIENT TYPE:  AMB   LOCATION:  ENDO                         FACILITY:  [facility]   PHYSICIAN:  John C. Amedeo Plenty, M.D.    DATE OF BIRTH:  Aug 13, 1960   DATE OF PROCEDURE:  05/31/2004  DATE OF DISCHARGE:                                 OPERATIVE REPORT   PROCEDURE:  Colonoscopy with biopsy.   INDICATIONS FOR PROCEDURE:  Patient with history of inflammatory bowel  disease of the colon presumed Crohn's who has had a recent flare-up not  responsive to standard oral medical therapy.  He also has a disease duration  of at least 12 years and is due for colon cancer screening.   PROCEDURE:  The patient was placed in the left lateral decubitus position  then placed on the pulse monitor with continuous low-flow oxygen delivered  by nasal cannula. He was sedated with 75 mcg IV fentanyl and 8 mg IV Versed.  The Olympus video colonoscope was inserted into the rectum, advanced to the  cecum, confirmed by transillumination at McBurney's point and visualization  of the ileocecal valve and appendiceal orifice. The prep was excellent. The  terminal ileum was entered and explored for a few centimeters and appeared  be within normal limits. The cecum, ascending and proximal transverse colon  appeared normal. No masses, polyps, diverticula or any of visible  inflammatory changes. Beginning at about 75 cm in what was felt to be the  distal transverse colon, there was transition to hypervascularity edema,  erythema, granularity and some small aphthous ulcers.  The appearance was  most intense from about 50-60 cm presumably in the descending colon and was  less prominent distal to that within the sigmoid and rectum. There did not  appear to be actual rectal sparing, but there were areas in the distal  sigmoid and rectum that appeared nearly normal with preservation of the  vascular pattern. Biopsies were taken of  the most visibly inflamed areas in  the transverse and descending colon. There were no polyps or pseudopolyps  seen. The scope was then withdrawn and the patient returned to the recovery  room in stable condition. He tolerated the procedure well and there were no  immediate complications.  Left-sided colitis, possibly into the transverse  colon but the most intense inflammation in the descending colon.   PLAN:  Await histology to the rule out dysplasia and better ascertain  whether Crohn's or colitis and to guide further therapy.      JCH/MEDQ  D:  05/31/2004  T:  05/31/2004  Job:  [id number]

## 2011-08-23 ENCOUNTER — Other Ambulatory Visit: Payer: Self-pay

## 2011-08-23 MED ORDER — MESALAMINE 1.2 G PO TBEC: DELAYED_RELEASE_TABLET | ORAL | Status: DC

## 2011-08-23 NOTE — Telephone Encounter (Signed)
Pt needs OV

## 2011-08-23 NOTE — Telephone Encounter (Signed)
Patient needs office visit prior to further refills

## 2011-08-23 NOTE — Telephone Encounter (Signed)
Pt aware he needs an appointment before he can get anymore refills.

## 2011-09-22 ENCOUNTER — Telehealth: Payer: Self-pay | Admitting: Internal Medicine

## 2011-09-22 NOTE — Telephone Encounter (Signed)
[name] wife called to make OV and asked if he ran out of Lialda could he get a few samples to hold him over until his OV.

## 2011-09-22 NOTE — Telephone Encounter (Signed)
pts last ov was 03/18/2010. He is scheduled for an ov on 11/01/2011. Is it ok to give samples?

## 2011-09-23 NOTE — Telephone Encounter (Signed)
Tried to call pt- LMOM 

## 2011-09-23 NOTE — Telephone Encounter (Signed)
May give enough until OV. Was taking four daily.

## 2011-09-26 NOTE — Telephone Encounter (Signed)
Pt picked up samples

## 2011-09-26 NOTE — Telephone Encounter (Signed)
Tried to call pt- LMOM 

## 2011-11-01 ENCOUNTER — Ambulatory Visit (INDEPENDENT_AMBULATORY_CARE_PROVIDER_SITE_OTHER): Payer: Managed Care, Other (non HMO) | Admitting: Internal Medicine

## 2011-11-01 ENCOUNTER — Encounter: Payer: Self-pay | Admitting: Internal Medicine

## 2011-11-01 VITALS — BP 129/73 | HR 82 | Temp 97.6°F | Ht 72.0 in | Wt 296.2 lb

## 2011-11-01 DIAGNOSIS — K5010 Crohn's disease of large intestine without complications: Secondary | ICD-10-CM

## 2011-11-01 NOTE — Progress Notes (Signed)
Primary Care Physician:  Octavio Graves, DO Primary Gastroenterologist:  Dr. Gala Romney  Pre-Procedure History & Physical: HPI:  Ricky Blackwell is a 51 y.o. male here for followup of a Crohn's colitis. He is doing well with off label Lialda 4.8 g daily. 1-2 bowel movements daily no diarrhea, tenesmus or rectal bleeding. Prior colonoscopy demonstrating patchy proctocolitis with active colitis in the sigmoid. No dysplasia. Last CBC in the map was done 2 years ago they were normal. Since I last saw him, he has had back surgery for herniated disc. Overall he is doing well.  Past Medical History  Diagnosis Date  . Irritable bowel syndrome   . Colitis   . Rectal bleeding   . Unspecified constipation   . Anal stenosis   . Crohn's disease     Past Surgical History  Procedure Date  . Back surgery   . Neck surgery   . Abdominal hernia repair 2010  . Knee surgery 2010    Left Arthoscopic  . Colonoscopy 05/2004    Dr. Amedeo Plenty- [facility]- crohns   . Colonoscopy 08/20/2008    Dr. Gala Romney- normal distal rectum, proximal rectum, scattered 1-2 mm aphthous ulcers, these extended into the distal sigmoid.    Prior to Admission medications   Medication Sig Start Date End Date Taking? Authorizing Provider  mesalamine (LIALDA) 1.2 G EC tablet Take 4 tablets every morning 08/23/11  Yes Andria Meuse, NP  mesalamine (LIALDA) 1.2 G EC tablet Take 1.2 g by mouth 4 (four) times daily.  11/01/11 Yes Orvil Feil, NP  oxyCODONE-acetaminophen (PERCOCET) 5-325 MG per tablet Take 1 tablet by mouth every 4 (four) hours as needed.     Yes Historical Provider, MD  lisinopril (PRINIVIL,ZESTRIL) 20 MG tablet Take 20 mg by mouth daily.      Historical Provider, MD  polyethylene glycol (MIRALAX / GLYCOLAX) packet Take 17 g by mouth daily.      Historical Provider, MD    Allergies as of 11/01/2011  . (No Known Allergies)    No family history on file.  History   Social History  . Marital Status: Married    Spouse Name:  N/A    Number of Children: N/A  . Years of Education: N/A   Occupational History  . Not on file.   Social History Main Topics  . Smoking status: Never Smoker   . Smokeless tobacco: Not on file  . Alcohol Use: No  . Drug Use: No  . Sexually Active: Not on file   Other Topics Concern  . Not on file   Social History Narrative  . No narrative on file    Review of Systems: See HPI, otherwise negative ROS  Physical Exam: BP 129/73  Pulse 82  Temp 97.6 F (36.4 C) (Temporal)  Ht 6' (1.829 m)  Wt 296 lb 3.2 oz (134.355 kg)  BMI 40.17 kg/m2 General:   Alert,  Well-developed, well-nourished, pleasant, obese and cooperative in NAD Skin:  Intact without significant lesions or rashes. Eyes:  Sclera clear, no icterus.   Conjunctiva pink. Ears:  Normal auditory acuity. Nose:  No deformity, discharge,  or lesions. Mouth:  No deformity or lesions. Neck:  Supple; no masses or thyromegaly. No significant cervical adenopathy. Lungs:  Clear throughout to auscultation.   No wheezes, crackles, or rhonchi. No acute distress. Heart:  Regular rate and rhythm; no murmurs, clicks, rubs,  or gallops. Abdomen: Obese. Non-distended, normal bowel sounds.  Soft and nontender without appreciable mass  or hepatosplenomegaly.  Pulses:  Normal pulses noted. Extremities:  Without clubbing or edema.  Impression/Plan:   Crohn's colitis in remission on Lialda as described above. Filling these continue this anti-inflammatory regimen for long-term to maintain remission.  Recommendations refill of Lialda 4.8 grams daily x1 year. BMET and CBC now.

## 2011-11-01 NOTE — Patient Instructions (Signed)
Basic metabolic profile and CBC  Continue lialda  OV 1 year

## 2011-11-08 LAB — CBC WITH DIFFERENTIAL/PLATELET
Eosinophils Relative: 2 % (ref 0–5)
Lymphocytes Relative: 26 % (ref 12–46)
Lymphs Abs: 1.9 10*3/uL (ref 0.7–4.0)
MCV: 89.2 fL (ref 78.0–100.0)
Neutrophils Relative %: 62 % (ref 43–77)
Platelets: 259 10*3/uL (ref 150–400)
RBC: 4.72 MIL/uL (ref 4.22–5.81)
WBC: 7.3 10*3/uL (ref 4.0–10.5)

## 2011-11-09 LAB — BASIC METABOLIC PANEL
CO2: 27 mEq/L (ref 19–32)
Chloride: 103 mEq/L (ref 96–112)
Sodium: 138 mEq/L (ref 135–145)

## 2013-01-09 ENCOUNTER — Other Ambulatory Visit: Payer: Self-pay

## 2013-01-09 MED ORDER — MESALAMINE 1.2 G PO TBEC: DELAYED_RELEASE_TABLET | ORAL | Status: DC

## 2013-01-09 NOTE — Telephone Encounter (Signed)
Pt needs routine f/u visit for history of Crohn's colitis.

## 2013-01-10 ENCOUNTER — Encounter: Payer: Self-pay | Admitting: Internal Medicine

## 2013-01-10 NOTE — Telephone Encounter (Signed)
Mailed letter °

## 2013-01-14 ENCOUNTER — Telehealth: Payer: Self-pay

## 2013-01-14 NOTE — Telephone Encounter (Signed)
[name] wife called this morning and that he did not need an appointment now because he was going to have knee surgery next month but he would call us if he needed anything.

## 2013-01-29 ENCOUNTER — Other Ambulatory Visit: Payer: Self-pay | Admitting: Physician Assistant

## 2013-02-07 ENCOUNTER — Other Ambulatory Visit (HOSPITAL_COMMUNITY): Payer: Managed Care, Other (non HMO)

## 2013-02-12 ENCOUNTER — Encounter (HOSPITAL_COMMUNITY)
Admission: RE | Admit: 2013-02-12 | Discharge: 2013-02-12 | Disposition: A | Discharge status: Home / Self Care | Payer: BC Managed Care – PPO | Source: Ambulatory Visit | Attending: Orthopedic Surgery | Admitting: Orthopedic Surgery

## 2013-02-12 ENCOUNTER — Encounter (HOSPITAL_COMMUNITY): Payer: Self-pay

## 2013-02-12 ENCOUNTER — Encounter (HOSPITAL_COMMUNITY)
Admission: RE | Admit: 2013-02-12 | Discharge: 2013-02-12 | Disposition: A | Discharge status: Home / Self Care | Payer: BC Managed Care – PPO | Source: Ambulatory Visit | Attending: Physician Assistant | Admitting: Physician Assistant

## 2013-02-12 DIAGNOSIS — Z01812 Encounter for preprocedural laboratory examination: Secondary | ICD-10-CM | POA: Insufficient documentation

## 2013-02-12 DIAGNOSIS — Z01810 Encounter for preprocedural cardiovascular examination: Secondary | ICD-10-CM | POA: Insufficient documentation

## 2013-02-12 DIAGNOSIS — Z01818 Encounter for other preprocedural examination: Secondary | ICD-10-CM | POA: Insufficient documentation

## 2013-02-12 HISTORY — DX: Personal history of other diseases of the digestive system: Z87.19

## 2013-02-12 HISTORY — DX: Pneumonia, unspecified organism: J18.9

## 2013-02-12 HISTORY — DX: Personal history of urinary calculi: Z87.442

## 2013-02-12 HISTORY — DX: Unspecified osteoarthritis, unspecified site: M19.90

## 2013-02-12 LAB — CBC WITH DIFFERENTIAL/PLATELET
Basophils Relative: 0 % (ref 0–1)
Eosinophils Relative: 3 % (ref 0–5)
Hemoglobin: 14.5 g/dL (ref 13.0–17.0)
Lymphs Abs: 1.8 10*3/uL (ref 0.7–4.0)
MCH: 30.8 pg (ref 26.0–34.0)
MCHC: 34.4 g/dL (ref 30.0–36.0)
Monocytes Relative: 10 % (ref 3–12)
Neutro Abs: 5.1 10*3/uL (ref 1.7–7.7)
Neutrophils Relative %: 65 % (ref 43–77)
Platelets: 216 10*3/uL (ref 150–400)
RBC: 4.71 MIL/uL (ref 4.22–5.81)
RDW: 12.6 % (ref 11.5–15.5)

## 2013-02-12 LAB — COMPREHENSIVE METABOLIC PANEL
ALT: 17 U/L (ref 0–53)
AST: 14 U/L (ref 0–37)
Albumin: 3.9 g/dL (ref 3.5–5.2)
Alkaline Phosphatase: 58 U/L (ref 39–117)
BUN: 14 mg/dL (ref 6–23)
Chloride: 104 mEq/L (ref 96–112)
GFR calc non Af Amer: >90 mL/min (ref >90)
Potassium: 4.5 mEq/L (ref 3.5–5.1)
Sodium: 139 mEq/L (ref 135–145)
Total Bilirubin: 0.2 mg/dL — ABNORMAL LOW (ref 0.3–1.2)
Total Protein: 7.1 g/dL (ref 6.0–8.3)

## 2013-02-12 LAB — PROTIME-INR: Prothrombin Time: 13.6 seconds (ref 11.6–15.2)

## 2013-02-12 LAB — URINALYSIS, ROUTINE W REFLEX MICROSCOPIC
Leukocytes, UA: NEGATIVE
Nitrite: NEGATIVE
Specific Gravity, Urine: 1.023 (ref 1.005–1.030)
Urobilinogen, UA: 1 mg/dL (ref 0.0–1.0)
pH: 6 (ref 5.0–8.0)

## 2013-02-12 LAB — APTT: aPTT: 33 seconds (ref 24–37)

## 2013-02-12 LAB — TYPE AND SCREEN: Antibody Screen: NEGATIVE

## 2013-02-12 LAB — SURGICAL PCR SCREEN: Staphylococcus aureus: POSITIVE — AB

## 2013-02-12 LAB — ABO/RH: ABO/RH(D): O NEG

## 2013-02-12 NOTE — Progress Notes (Addendum)
req'd ekg, office notes last visit to pcp dr Samuel Jester [facility] [place] [phone]

## 2013-02-12 NOTE — Pre-Procedure Instructions (Addendum)
Ricky Blackwell  02/12/2013   Your procedure is scheduled on:  02/22/13  Report to [facility] [place] [facility]  2 * 3 at 830 AM.  Call this number if you have problems the morning of surgery: [phone]   Remember:   Do not eat food or drink liquids after midnight.   Take these medicines the morning of surgery with A SIP OF WATER:  pain med              Do not wear jewelry, make-up or nail polish.  Do not wear lotions, powders, or perfumes. You may wear deodorant.  Do not shave 48 hours prior to surgery. Men may shave face and neck.  Do not bring valuables to the hospital.  [facility] is not responsible                  for any belongings or valuables.               Contacts, dentures or bridgework may not be worn into surgery.  Leave suitcase in the car. After surgery it may be brought to your room.  For patients admitted to the hospital, discharge time is determined by your                treatment team.               Patients discharged the day of surgery will not be allowed to drive  home.  Name and phone number of your driver:   Special Instructions: Incentive Spirometry - Practice and bring it with you on the day of surgery. Shower using CHG 2 nights before surgery and the night before surgery.  If you shower the day of surgery use CHG.  Use special wash - you have one bottle of CHG for all showers.  You should use approximately 1/3 of the bottle for each shower.   Please read over the following fact sheets that you were given: Pain Booklet, Coughing and Deep Breathing, Blood Transfusion Information, MRSA Information and Surgical Site Infection Prevention

## 2013-02-13 LAB — URINE CULTURE
Colony Count: NO GROWTH
Culture: NO GROWTH

## 2013-02-14 NOTE — Progress Notes (Signed)
Anesthesia Chart Review:  Patient is a 52 year old male scheduled for left TKA on 02/23/12 by Dr. Madelon Lips.  History noted.    EKG on 02/12/13 showed NSR, incomplete right BBB. He had a negative exercise treadmill test on 03/20/07 following an evaluation with Dr. Antoine Poche for atypical chest pain.  CXR on 02/12/13 showed: Normal heart size, mediastinal contours, and pulmonary vascularity. Azygos fissure noted. Minimal left basilar atelectasis or scarring.  Lungs otherwise Clear. No pleural effusion or pneumothorax. Prior cervical spine fusion. No acute osseous findings.  Preoperative labs noted.  PCP is Dr. Samuel Jester with [facility].  She cleared patient for this procedure.  If no acute changes then plan to proceed.  [facility] Stay [facility]/Anesthesiology Phone [phone] 02/14/2013 10:44 AM

## 2013-02-21 MED ORDER — CEFAZOLIN SODIUM 10 G IJ SOLR
3.0000 g | INTRAMUSCULAR | Status: AC
  Administered 2013-02-22: 3 g via INTRAVENOUS
  Filled 2013-02-21: qty 3000

## 2013-02-21 NOTE — H&P (Signed)
TOTAL KNEE ADMISSION H&P  Patient is being admitted for left total knee arthroplasty.  Subjective:  Chief Complaint:left knee pain.  HPI: Ricky Blackwell, 52 y.o. male, has a history of pain and functional disability in the left knee due to arthritis and has failed non-surgical conservative treatments for greater than 12 weeks to includeNSAID's and/or analgesics, corticosteriod injections, viscosupplementation injections, weight reduction as appropriate and activity modification.  Onset of symptoms was gradual, starting 4 years ago with gradually worsening course since that time. The patient noted prior procedures on the knee to include  arthroscopy and menisectomy on the left knee(s).  Patient currently rates pain in the left knee(s) at 10 out of 10 with activity. Patient has night pain, worsening of pain with activity and weight bearing, pain that interferes with activities of daily living, pain with passive range of motion and joint swelling.  Patient has evidence of periarticular osteophytes and joint space narrowing by imaging studies. There is no active infection.  Patient Active Problem List   Diagnosis Date Noted  . CROHN'S DISEASE 06/26/2008  . COLITIS 06/26/2008  . UNSPECIFIED CONSTIPATION 06/26/2008  . ANAL STENOSIS 06/26/2008  . RECTAL BLEEDING 06/26/2008  . IRRITABLE BOWEL SYNDROME, HX OF 06/26/2008   Past Medical History  Diagnosis Date  . Irritable bowel syndrome   . Colitis   . Rectal bleeding   . Unspecified constipation   . Anal stenosis   . Crohn's disease   . Pneumonia     hx  . History of kidney stones   . H/O hiatal hernia   . Arthritis     Past Surgical History  Procedure Laterality Date  . Back surgery    . Neck surgery    . Abdominal hernia repair  2010  . Knee surgery  2010    Left Arthoscopic  . Colonoscopy  05/2004    Dr. Madilyn Fireman- [facility]- crohns   . Colonoscopy  08/20/2008    Dr. Jena Gauss- normal distal rectum, proximal rectum, scattered 1-2 mm  aphthous ulcers, these extended into the distal sigmoid.     (Not in a hospital admission) No Known Allergies  History  Substance Use Topics  . Smoking status: Former Smoker -- 2.00 packs/day for 10 years    Types: Cigarettes    Quit date: 02/13/1987  . Smokeless tobacco: Not on file  . Alcohol Use: No    No family history on file.   Review of Systems  Constitutional: Negative.   HENT: Positive for hearing loss, nosebleeds and tinnitus. Negative for congestion, ear discharge, ear pain and sore throat.   Eyes: Negative.   Respiratory: Negative.  Negative for stridor.   Cardiovascular: Positive for leg swelling. Negative for chest pain, palpitations, orthopnea and claudication.  Gastrointestinal: Positive for diarrhea, constipation and blood in stool. Negative for heartburn, nausea, vomiting, abdominal pain and melena.  Genitourinary: Negative.   Musculoskeletal: Positive for joint pain.  Skin: Negative.   Neurological: Negative.  Negative for headaches.  Endo/Heme/Allergies: Negative.   Psychiatric/Behavioral: Negative.     Objective:  Physical Exam  Constitutional: He is oriented to person, place, and time. He appears well-developed and well-nourished. No distress.  HENT:  Head: Normocephalic and atraumatic.  Nose: Nose normal.  Eyes: Conjunctivae and EOM are normal. Pupils are equal, round, and reactive to light.  Neck: Normal range of motion. Neck supple.  Cardiovascular: Normal rate, regular rhythm, normal heart sounds and intact distal pulses.   No murmur heard. Respiratory: Effort normal and breath  sounds normal. No respiratory distress. He has no wheezes.  GI: Soft. Bowel sounds are normal. He exhibits no distension. There is no tenderness.  Musculoskeletal:       Left knee: He exhibits decreased range of motion, swelling and bony tenderness. He exhibits no effusion, no ecchymosis, no deformity, no laceration, no erythema, no LCL laxity and no MCL laxity. Tenderness  found. Medial joint line and lateral joint line tenderness noted.  Lymphadenopathy:    He has no cervical adenopathy.  Neurological: He is alert and oriented to person, place, and time. No cranial nerve deficit.  Skin: Skin is warm and dry. No rash noted. No erythema.  Psychiatric: He has a normal mood and affect. His behavior is normal.    Vital signs in last 24 hours: @VSRANGES @  Labs:   Estimated body mass index is 40.16 kg/(m^2) as calculated from the following:   Height as of 11/01/11: 6' (1.829 m).   Weight as of 11/01/11: 134.355 kg (296 lb 3.2 oz).   Imaging Review Plain radiographs demonstrate severe degenerative joint disease of the left knee(s). The overall alignment ismild varus. The bone quality appears to be good for age and reported activity level.  Assessment/Plan:  End stage arthritis, left knee   The patient history, physical examination, clinical judgment of the provider and imaging studies are consistent with end stage degenerative joint disease of the left knee(s) and total knee arthroplasty is deemed medically necessary. The treatment options including medical management, injection therapy arthroscopy and arthroplasty were discussed at length. The risks and benefits of total knee arthroplasty were presented and reviewed. The risks due to aseptic loosening, infection, stiffness, patella tracking problems, thromboembolic complications and other imponderables were discussed. The patient acknowledged the explanation, agreed to proceed with the plan and consent was signed. Patient is being admitted for inpatient treatment for surgery, pain control, PT, OT, prophylactic antibiotics, VTE prophylaxis, progressive ambulation and ADL's and discharge planning. The patient is planning to be discharged home with home health services

## 2013-02-22 ENCOUNTER — Inpatient Hospital Stay (HOSPITAL_COMMUNITY)
Admission: RE | Admit: 2013-02-22 | Discharge: 2013-02-24 | DRG: 470 | Disposition: A | Discharge status: Home Health | Payer: BC Managed Care – PPO | Source: Ambulatory Visit | Attending: Orthopedic Surgery | Admitting: Orthopedic Surgery

## 2013-02-22 ENCOUNTER — Encounter (HOSPITAL_COMMUNITY): Payer: BC Managed Care – PPO | Admitting: Vascular Surgery

## 2013-02-22 ENCOUNTER — Encounter (HOSPITAL_COMMUNITY): Payer: Self-pay | Admitting: Certified Registered Nurse Anesthetist

## 2013-02-22 ENCOUNTER — Encounter (HOSPITAL_COMMUNITY)
Admission: RE | Disposition: A | Discharge status: Home Health | Payer: Self-pay | Source: Ambulatory Visit | Attending: Orthopedic Surgery

## 2013-02-22 ENCOUNTER — Inpatient Hospital Stay (HOSPITAL_COMMUNITY): Payer: BC Managed Care – PPO | Admitting: Certified Registered Nurse Anesthetist

## 2013-02-22 DIAGNOSIS — Z87442 Personal history of urinary calculi: Secondary | ICD-10-CM

## 2013-02-22 DIAGNOSIS — K589 Irritable bowel syndrome without diarrhea: Secondary | ICD-10-CM | POA: Diagnosis present

## 2013-02-22 DIAGNOSIS — Z87891 Personal history of nicotine dependence: Secondary | ICD-10-CM

## 2013-02-22 DIAGNOSIS — M1711 Unilateral primary osteoarthritis, right knee: Secondary | ICD-10-CM

## 2013-02-22 DIAGNOSIS — Z6841 Body Mass Index (BMI) 40.0 and over, adult: Secondary | ICD-10-CM

## 2013-02-22 DIAGNOSIS — Z7901 Long term (current) use of anticoagulants: Secondary | ICD-10-CM

## 2013-02-22 DIAGNOSIS — Z79899 Other long term (current) drug therapy: Secondary | ICD-10-CM

## 2013-02-22 DIAGNOSIS — M1710 Unilateral primary osteoarthritis, unspecified knee: Principal | ICD-10-CM | POA: Diagnosis present

## 2013-02-22 HISTORY — PX: TOTAL KNEE ARTHROPLASTY: SHX125

## 2013-02-22 LAB — CBC
HCT: 41.5 % (ref 39.0–52.0)
Hemoglobin: 14.6 g/dL (ref 13.0–17.0)
MCH: 31.6 pg (ref 26.0–34.0)
MCHC: 35.2 g/dL (ref 30.0–36.0)
Platelets: 221 10*3/uL (ref 150–400)
RDW: 12.7 % (ref 11.5–15.5)

## 2013-02-22 LAB — CREATININE, SERUM: GFR calc non Af Amer: >90 mL/min (ref >90)

## 2013-02-22 SURGERY — ARTHROPLASTY, KNEE, TOTAL
Anesthesia: General | Site: Knee | Laterality: Left | Wound class: Clean

## 2013-02-22 MED ORDER — PROPOFOL 10 MG/ML IV BOLUS
INTRAVENOUS | Status: DC | PRN
  Administered 2013-02-22: 300 mg via INTRAVENOUS
  Administered 2013-02-22: 50 mg via INTRAVENOUS

## 2013-02-22 MED ORDER — ONDANSETRON HCL 4 MG/2ML IJ SOLN
INTRAMUSCULAR | Status: DC | PRN
  Administered 2013-02-22: 4 mg via INTRAVENOUS

## 2013-02-22 MED ORDER — SODIUM CHLORIDE 0.9 % IR SOLN
Status: DC | PRN
  Administered 2013-02-22: 3000 mL

## 2013-02-22 MED ORDER — OXYCODONE HCL 5 MG PO TABS
5.0000 mg | ORAL_TABLET | ORAL | Status: DC | PRN
  Administered 2013-02-22 – 2013-02-24 (×9): 10 mg via ORAL
  Filled 2013-02-22 (×9): qty 2

## 2013-02-22 MED ORDER — FENTANYL CITRATE 0.05 MG/ML IJ SOLN
50.0000 ug | Freq: Once | INTRAMUSCULAR | Status: AC
  Administered 2013-02-22: 100 ug via INTRAVENOUS

## 2013-02-22 MED ORDER — GLYCOPYRROLATE 0.2 MG/ML IJ SOLN
INTRAMUSCULAR | Status: DC | PRN
  Administered 2013-02-22 (×2): 0.2 mg via INTRAVENOUS

## 2013-02-22 MED ORDER — METOCLOPRAMIDE HCL 10 MG PO TABS: 5.0000 mg | ORAL_TABLET | Freq: Three times a day (TID) | ORAL | Status: DC | PRN

## 2013-02-22 MED ORDER — EPHEDRINE SULFATE 50 MG/ML IJ SOLN
INTRAMUSCULAR | Status: DC | PRN
  Administered 2013-02-22: 10 mg via INTRAVENOUS

## 2013-02-22 MED ORDER — METHOCARBAMOL 100 MG/ML IJ SOLN
500.0000 mg | Freq: Four times a day (QID) | INTRAVENOUS | Status: DC | PRN
  Filled 2013-02-22: qty 5

## 2013-02-22 MED ORDER — ONDANSETRON HCL 4 MG/2ML IJ SOLN: 4.0000 mg | Freq: Four times a day (QID) | INTRAMUSCULAR | Status: DC | PRN

## 2013-02-22 MED ORDER — MIDAZOLAM HCL 5 MG/5ML IJ SOLN
INTRAMUSCULAR | Status: DC | PRN
  Administered 2013-02-22: 2 mg via INTRAVENOUS

## 2013-02-22 MED ORDER — HYDROMORPHONE HCL PF 1 MG/ML IJ SOLN
0.2500 mg | INTRAMUSCULAR | Status: DC | PRN
  Administered 2013-02-22 (×4): 0.5 mg via INTRAVENOUS

## 2013-02-22 MED ORDER — FENTANYL CITRATE 0.05 MG/ML IJ SOLN
INTRAMUSCULAR | Status: AC
  Administered 2013-02-22: 100 ug via INTRAVENOUS
  Filled 2013-02-22: qty 2

## 2013-02-22 MED ORDER — LACTATED RINGERS IV SOLN
INTRAVENOUS | Status: DC
  Administered 2013-02-22: 09:00:00 via INTRAVENOUS

## 2013-02-22 MED ORDER — OXYCODONE HCL 5 MG PO TABS: ORAL_TABLET | ORAL | Status: DC

## 2013-02-22 MED ORDER — ACETAMINOPHEN 325 MG PO TABS: 650.0000 mg | ORAL_TABLET | Freq: Four times a day (QID) | ORAL | Status: DC | PRN

## 2013-02-22 MED ORDER — BUPIVACAINE-EPINEPHRINE PF 0.5-1:200000 % IJ SOLN
INTRAMUSCULAR | Status: DC | PRN
  Administered 2013-02-22: 25 mL

## 2013-02-22 MED ORDER — DEXAMETHASONE SODIUM PHOSPHATE 10 MG/ML IJ SOLN
INTRAMUSCULAR | Status: DC | PRN
  Administered 2013-02-22: 4 mg

## 2013-02-22 MED ORDER — MIDAZOLAM HCL 2 MG/2ML IJ SOLN
INTRAMUSCULAR | Status: AC
  Administered 2013-02-22: 2 mg via INTRAVENOUS
  Filled 2013-02-22: qty 2

## 2013-02-22 MED ORDER — METHOCARBAMOL 500 MG PO TABS: 500.0000 mg | ORAL_TABLET | Freq: Four times a day (QID) | ORAL | Status: DC | PRN

## 2013-02-22 MED ORDER — FENTANYL CITRATE 0.05 MG/ML IJ SOLN
INTRAMUSCULAR | Status: DC | PRN
  Administered 2013-02-22: 50 ug via INTRAVENOUS
  Administered 2013-02-22: 100 ug via INTRAVENOUS
  Administered 2013-02-22 (×2): 50 ug via INTRAVENOUS
  Administered 2013-02-22: 100 ug via INTRAVENOUS
  Administered 2013-02-22 (×2): 50 ug via INTRAVENOUS

## 2013-02-22 MED ORDER — LACTATED RINGERS IV SOLN
INTRAVENOUS | Status: DC | PRN
  Administered 2013-02-22 (×2): via INTRAVENOUS

## 2013-02-22 MED ORDER — SODIUM CHLORIDE 0.9 % IV SOLN
INTRAVENOUS | Status: DC
  Administered 2013-02-22 – 2013-02-23 (×2): via INTRAVENOUS

## 2013-02-22 MED ORDER — LIDOCAINE HCL (CARDIAC) 10 MG/ML IV SOLN
INTRAVENOUS | Status: DC | PRN
  Administered 2013-02-22: 100 mg via INTRAVENOUS

## 2013-02-22 MED ORDER — MIDAZOLAM HCL 2 MG/2ML IJ SOLN
1.0000 mg | INTRAMUSCULAR | Status: DC | PRN
  Administered 2013-02-22: 2 mg via INTRAVENOUS

## 2013-02-22 MED ORDER — CHLORHEXIDINE GLUCONATE 4 % EX LIQD: 60.0000 mL | Freq: Once | CUTANEOUS | Status: DC

## 2013-02-22 MED ORDER — SODIUM CHLORIDE 0.9 % IV SOLN: INTRAVENOUS | Status: DC

## 2013-02-22 MED ORDER — TRANEXAMIC ACID 100 MG/ML IV SOLN
1000.0000 mg | INTRAVENOUS | Status: AC
  Administered 2013-02-22: 1000 mg via INTRAVENOUS
  Filled 2013-02-22: qty 10

## 2013-02-22 MED ORDER — METHOCARBAMOL 500 MG PO TABS
500.0000 mg | ORAL_TABLET | Freq: Four times a day (QID) | ORAL | Status: DC | PRN
  Administered 2013-02-22 – 2013-02-24 (×5): 500 mg via ORAL
  Filled 2013-02-22 (×5): qty 1

## 2013-02-22 MED ORDER — DOCUSATE SODIUM 100 MG PO CAPS
100.0000 mg | ORAL_CAPSULE | Freq: Two times a day (BID) | ORAL | Status: DC
  Administered 2013-02-22 – 2013-02-24 (×5): 100 mg via ORAL
  Filled 2013-02-22 (×6): qty 1

## 2013-02-22 MED ORDER — HYDROMORPHONE HCL PF 1 MG/ML IJ SOLN
1.0000 mg | INTRAMUSCULAR | Status: DC | PRN
  Administered 2013-02-22 – 2013-02-23 (×3): 1 mg via INTRAVENOUS
  Filled 2013-02-22 (×3): qty 1

## 2013-02-22 MED ORDER — CEFAZOLIN SODIUM-DEXTROSE 2-3 GM-% IV SOLR
2.0000 g | Freq: Four times a day (QID) | INTRAVENOUS | Status: AC
  Administered 2013-02-22 (×2): 2 g via INTRAVENOUS
  Filled 2013-02-22 (×2): qty 50

## 2013-02-22 MED ORDER — DIAZEPAM 5 MG/ML IJ SOLN
INTRAMUSCULAR | Status: AC
  Filled 2013-02-22: qty 2

## 2013-02-22 MED ORDER — METOCLOPRAMIDE HCL 5 MG/ML IJ SOLN: 5.0000 mg | Freq: Three times a day (TID) | INTRAMUSCULAR | Status: DC | PRN

## 2013-02-22 MED ORDER — ACETAMINOPHEN 650 MG RE SUPP: 650.0000 mg | Freq: Four times a day (QID) | RECTAL | Status: DC | PRN

## 2013-02-22 MED ORDER — ONDANSETRON HCL 4 MG PO TABS: 4.0000 mg | ORAL_TABLET | Freq: Four times a day (QID) | ORAL | Status: DC | PRN

## 2013-02-22 MED ORDER — MESALAMINE 1.2 G PO TBEC
4.8000 g | DELAYED_RELEASE_TABLET | Freq: Every day | ORAL | Status: DC
  Administered 2013-02-23 – 2013-02-24 (×2): 4.8 g via ORAL
  Filled 2013-02-22 (×4): qty 4

## 2013-02-22 MED ORDER — OXYCODONE HCL 5 MG PO TABS: 5.0000 mg | ORAL_TABLET | Freq: Once | ORAL | Status: DC | PRN

## 2013-02-22 MED ORDER — OXYCODONE HCL 5 MG/5ML PO SOLN: 5.0000 mg | Freq: Once | ORAL | Status: DC | PRN

## 2013-02-22 MED ORDER — ENOXAPARIN SODIUM 30 MG/0.3ML ~~LOC~~ SOLN: 30.0000 mg | Freq: Two times a day (BID) | SUBCUTANEOUS | Status: DC

## 2013-02-22 MED ORDER — MENTHOL 3 MG MT LOZG
1.0000 | LOZENGE | OROMUCOSAL | Status: DC | PRN
  Filled 2013-02-22: qty 9

## 2013-02-22 MED ORDER — DIPHENHYDRAMINE HCL 12.5 MG/5ML PO ELIX: 12.5000 mg | ORAL_SOLUTION | ORAL | Status: DC | PRN

## 2013-02-22 MED ORDER — PROMETHAZINE HCL 25 MG/ML IJ SOLN: 6.2500 mg | INTRAMUSCULAR | Status: DC | PRN

## 2013-02-22 MED ORDER — HYDROMORPHONE HCL PF 1 MG/ML IJ SOLN
INTRAMUSCULAR | Status: AC
  Administered 2013-02-22: 0.5 mg via INTRAVENOUS
  Filled 2013-02-22: qty 2

## 2013-02-22 MED ORDER — DEXAMETHASONE SODIUM PHOSPHATE 4 MG/ML IJ SOLN
INTRAMUSCULAR | Status: DC | PRN
  Administered 2013-02-22: 8 mg via INTRAVENOUS

## 2013-02-22 MED ORDER — ENOXAPARIN SODIUM 30 MG/0.3ML ~~LOC~~ SOLN
30.0000 mg | Freq: Two times a day (BID) | SUBCUTANEOUS | Status: DC
  Administered 2013-02-23 – 2013-02-24 (×3): 30 mg via SUBCUTANEOUS
  Filled 2013-02-22 (×5): qty 0.3

## 2013-02-22 MED ORDER — POLYETHYLENE GLYCOL 3350 17 G PO PACK
17.0000 g | PACK | Freq: Every day | ORAL | Status: DC
  Administered 2013-02-22 – 2013-02-23 (×2): 17 g via ORAL
  Filled 2013-02-22 (×4): qty 1

## 2013-02-22 MED ORDER — DEXAMETHASONE SODIUM PHOSPHATE 10 MG/ML IJ SOLN
10.0000 mg | Freq: Three times a day (TID) | INTRAMUSCULAR | Status: AC
  Administered 2013-02-22: 10 mg via INTRAVENOUS
  Filled 2013-02-22 (×3): qty 1

## 2013-02-22 MED ORDER — DEXAMETHASONE 6 MG PO TABS
10.0000 mg | ORAL_TABLET | Freq: Three times a day (TID) | ORAL | Status: AC
  Administered 2013-02-22 – 2013-02-23 (×2): 10 mg via ORAL
  Filled 2013-02-22 (×3): qty 1

## 2013-02-22 MED ORDER — PHENOL 1.4 % MT LIQD: 1.0000 | OROMUCOSAL | Status: DC | PRN

## 2013-02-22 MED ORDER — DIAZEPAM 5 MG/ML IJ SOLN
INTRAMUSCULAR | Status: DC | PRN
  Administered 2013-02-22: 5 mg via INTRAVENOUS

## 2013-02-22 SURGICAL SUPPLY — 59 items
BANDAGE ELASTIC 4 VELCRO ST LF (GAUZE/BANDAGES/DRESSINGS) ×2 IMPLANT
BANDAGE ELASTIC 6 VELCRO ST LF (GAUZE/BANDAGES/DRESSINGS) ×2 IMPLANT
BANDAGE ESMARK 6X9 LF (GAUZE/BANDAGES/DRESSINGS) ×1 IMPLANT
BLADE SAGITTAL 25.0X1.19X90 (BLADE) ×2 IMPLANT
BLADE SAW SAG 90X13X1.27 (BLADE) ×2 IMPLANT
BNDG ESMARK 6X9 LF (GAUZE/BANDAGES/DRESSINGS) ×2
BOWL SMART MIX CTS (DISPOSABLE) ×2 IMPLANT
CAPT RP KNEE ×2 IMPLANT
CEMENT HV SMART SET (Cement) ×4 IMPLANT
CLOTH BEACON ORANGE TIMEOUT ST (SAFETY) IMPLANT
COVER SURGICAL LIGHT HANDLE (MISCELLANEOUS) ×2 IMPLANT
CUFF TOURNIQUET SINGLE 34IN LL (TOURNIQUET CUFF) ×2 IMPLANT
CUFF TOURNIQUET SINGLE 44IN (TOURNIQUET CUFF) IMPLANT
DRAPE INCISE IOBAN 66X45 STRL (DRAPES) ×2 IMPLANT
DRAPE ORTHO SPLIT 77X108 STRL (DRAPES) ×2
DRAPE SURG ORHT 6 SPLT 77X108 (DRAPES) ×2 IMPLANT
DRAPE U-SHAPE 47X51 STRL (DRAPES) ×2 IMPLANT
DRSG ADAPTIC 3X8 NADH LF (GAUZE/BANDAGES/DRESSINGS) ×2 IMPLANT
DRSG PAD ABDOMINAL 8X10 ST (GAUZE/BANDAGES/DRESSINGS) ×4 IMPLANT
DURAPREP 26ML APPLICATOR (WOUND CARE) ×2 IMPLANT
ELECT REM PT RETURN 9FT ADLT (ELECTROSURGICAL) ×2
ELECTRODE REM PT RTRN 9FT ADLT (ELECTROSURGICAL) ×1 IMPLANT
EVACUATOR 1/8 PVC DRAIN (DRAIN) ×2 IMPLANT
FACESHIELD LNG OPTICON STERILE (SAFETY) ×2 IMPLANT
FLOSEAL 10ML (HEMOSTASIS) IMPLANT
GLOVE BIOGEL PI IND STRL 8 (GLOVE) ×2 IMPLANT
GLOVE BIOGEL PI INDICATOR 8 (GLOVE) ×2
GLOVE ORTHO TXT STRL SZ7.5 (GLOVE) ×2 IMPLANT
GLOVE SURG ORTHO 8.0 STRL STRW (GLOVE) ×2 IMPLANT
GOWN PREVENTION PLUS XLARGE (GOWN DISPOSABLE) ×2 IMPLANT
GOWN PREVENTION PLUS XXLARGE (GOWN DISPOSABLE) ×2 IMPLANT
GOWN STRL NON-REIN LRG LVL3 (GOWN DISPOSABLE) ×4 IMPLANT
HANDPIECE INTERPULSE COAX TIP (DISPOSABLE) ×1
HOOD PEEL AWAY FACE SHEILD DIS (HOOD) ×2 IMPLANT
IMMOBILIZER KNEE 22 UNIV (SOFTGOODS) ×2 IMPLANT
KIT BASIN OR (CUSTOM PROCEDURE TRAY) ×2 IMPLANT
KIT ROOM TURNOVER OR (KITS) ×2 IMPLANT
MANIFOLD NEPTUNE II (INSTRUMENTS) ×2 IMPLANT
NEEDLE 22X1 1/2 (OR ONLY) (NEEDLE) IMPLANT
NS IRRIG 1000ML POUR BTL (IV SOLUTION) ×2 IMPLANT
PACK TOTAL JOINT (CUSTOM PROCEDURE TRAY) ×2 IMPLANT
PAD ARMBOARD 7.5X6 YLW CONV (MISCELLANEOUS) ×4 IMPLANT
PAD CAST 4YDX4 CTTN HI CHSV (CAST SUPPLIES) ×1 IMPLANT
PADDING CAST COTTON 4X4 STRL (CAST SUPPLIES) ×1
PADDING CAST COTTON 6X4 STRL (CAST SUPPLIES) ×2 IMPLANT
SET HNDPC FAN SPRY TIP SCT (DISPOSABLE) ×1 IMPLANT
SPONGE GAUZE 4X4 12PLY (GAUZE/BANDAGES/DRESSINGS) ×2 IMPLANT
STAPLER VISISTAT 35W (STAPLE) ×2 IMPLANT
SUCTION FRAZIER TIP 10 FR DISP (SUCTIONS) ×2 IMPLANT
SUT ETHIBOND NAB CT1 #1 30IN (SUTURE) ×4 IMPLANT
SUT VIC AB 0 CT1 27 (SUTURE) ×1
SUT VIC AB 0 CT1 27XBRD ANBCTR (SUTURE) ×1 IMPLANT
SUT VIC AB 2-0 CT1 27 (SUTURE) ×2
SUT VIC AB 2-0 CT1 TAPERPNT 27 (SUTURE) ×2 IMPLANT
SYR CONTROL 10ML LL (SYRINGE) IMPLANT
TOWEL OR 17X24 6PK STRL BLUE (TOWEL DISPOSABLE) ×2 IMPLANT
TOWEL OR 17X26 10 PK STRL BLUE (TOWEL DISPOSABLE) ×2 IMPLANT
TRAY FOLEY CATH 16FRSI W/METER (SET/KITS/TRAYS/PACK) ×2 IMPLANT
WATER STERILE IRR 1000ML POUR (IV SOLUTION) ×4 IMPLANT

## 2013-02-22 NOTE — Progress Notes (Signed)
Orthopedic Tech Progress Note Patient Details:  Ricky Blackwell Jan 01, 1961 [id number]  CPM Left Knee CPM Left Knee: On Left Knee Flexion (Degrees): 60 Left Knee Extension (Degrees): 0 Additional Comments: trapeze bar patient helper   Nikki Dom 02/22/2013, 2:21 PM

## 2013-02-22 NOTE — Preoperative (Signed)
Beta Blockers   Reason not to administer Beta Blockers:Not Applicable 

## 2013-02-22 NOTE — Anesthesia Postprocedure Evaluation (Signed)
  Anesthesia Post-op Note  Patient: Ricky Blackwell  Procedure(s) Performed: Procedure(s): LEFT TOTAL KNEE ARTHROPLASTY (Left)  Patient Location: PACU  Anesthesia Type:General  Level of Consciousness: awake  Airway and Oxygen Therapy: Patient Spontanous Breathing  Post-op Pain: mild  Post-op Assessment: Post-op Vital signs reviewed, Patient's Cardiovascular Status Stable, Respiratory Function Stable, Patent Airway, No signs of Nausea or vomiting and Pain level controlled  Post-op Vital Signs: Reviewed and stable  Complications: No apparent anesthesia complications

## 2013-02-22 NOTE — Interval H&P Note (Signed)
History and Physical Interval Note:  02/22/2013 9:15 AM  Ricky Blackwell  has presented today for surgery, with the diagnosis of OA LEFT KNEE  The various methods of treatment have been discussed with the patient and family. After consideration of risks, benefits and other options for treatment, the patient has consented to  Procedure(s): LEFT TOTAL KNEE ARTHROPLASTY (Left) as a surgical intervention .  The patient's history has been reviewed, patient examined, no change in status, stable for surgery.  I have reviewed the patient's chart and labs.  Questions were answered to the patient's satisfaction.     Allannah Kempen JR,W D

## 2013-02-22 NOTE — Anesthesia Preprocedure Evaluation (Addendum)
Anesthesia Evaluation  Patient identified by MRN, date of birth, ID band Patient awake    Reviewed: Allergy & Precautions, H&P , NPO status , Patient's Chart, lab work & pertinent test results  Airway Mallampati: II TM Distance: >3 FB Neck ROM: Full    Dental   Pulmonary former smoker,  breath sounds clear to auscultation        Cardiovascular Rhythm:Regular Rate:Normal     Neuro/Psych    GI/Hepatic hiatal hernia, Crohn's disease   Endo/Other  Morbid obesity  Renal/GU      Musculoskeletal   Abdominal (+) + obese,   Peds  Hematology   Anesthesia Other Findings   Reproductive/Obstetrics                          Anesthesia Physical Anesthesia Plan  ASA: II  Anesthesia Plan: General   Post-op Pain Management:    Induction: Intravenous  Airway Management Planned: LMA  Additional Equipment:   Intra-op Plan:   Post-operative Plan: Extubation in OR  Informed Consent: I have reviewed the patients History and Physical, chart, labs and discussed the procedure including the risks, benefits and alternatives for the proposed anesthesia with the patient or authorized representative who has indicated his/her understanding and acceptance.     Plan Discussed with: CRNA and Surgeon  Anesthesia Plan Comments:         Anesthesia Quick Evaluation

## 2013-02-22 NOTE — H&P (View-Only) (Signed)
TOTAL KNEE ADMISSION H&P  Patient is being admitted for left total knee arthroplasty.  Subjective:  Chief Complaint:left knee pain.  HPI: Ricky Blackwell, 52 y.o. male, has a history of pain and functional disability in the left knee due to arthritis and has failed non-surgical conservative treatments for greater than 12 weeks to includeNSAID's and/or analgesics, corticosteriod injections, viscosupplementation injections, weight reduction as appropriate and activity modification.  Onset of symptoms was gradual, starting 4 years ago with gradually worsening course since that time. The patient noted prior procedures on the knee to include  arthroscopy and menisectomy on the left knee(s).  Patient currently rates pain in the left knee(s) at 10 out of 10 with activity. Patient has night pain, worsening of pain with activity and weight bearing, pain that interferes with activities of daily living, pain with passive range of motion and joint swelling.  Patient has evidence of periarticular osteophytes and joint space narrowing by imaging studies. There is no active infection.  Patient Active Problem List   Diagnosis Date Noted  . CROHN'S DISEASE 06/26/2008  . COLITIS 06/26/2008  . UNSPECIFIED CONSTIPATION 06/26/2008  . ANAL STENOSIS 06/26/2008  . RECTAL BLEEDING 06/26/2008  . IRRITABLE BOWEL SYNDROME, HX OF 06/26/2008   Past Medical History  Diagnosis Date  . Irritable bowel syndrome   . Colitis   . Rectal bleeding   . Unspecified constipation   . Anal stenosis   . Crohn's disease   . Pneumonia     hx  . History of kidney stones   . H/O hiatal hernia   . Arthritis     Past Surgical History  Procedure Laterality Date  . Back surgery    . Neck surgery    . Abdominal hernia repair  2010  . Knee surgery  2010    Left Arthoscopic  . Colonoscopy  05/2004    Dr. Hayes- [facility]- crohns   . Colonoscopy  08/20/2008    Dr. [name]- normal distal rectum, proximal rectum, scattered 1-2 mm  aphthous ulcers, these extended into the distal sigmoid.     (Not in a hospital admission) No Known Allergies  History  Substance Use Topics  . Smoking status: Former Smoker -- 2.00 packs/day for 10 years    Types: Cigarettes    Quit date: 02/13/1987  . Smokeless tobacco: Not on file  . Alcohol Use: No    No family history on file.   Review of Systems  Constitutional: Negative.   HENT: Positive for hearing loss, nosebleeds and tinnitus. Negative for congestion, ear discharge, ear pain and sore throat.   Eyes: Negative.   Respiratory: Negative.  Negative for stridor.   Cardiovascular: Positive for leg swelling. Negative for chest pain, palpitations, orthopnea and claudication.  Gastrointestinal: Positive for diarrhea, constipation and blood in stool. Negative for heartburn, nausea, vomiting, abdominal pain and melena.  Genitourinary: Negative.   Musculoskeletal: Positive for joint pain.  Skin: Negative.   Neurological: Negative.  Negative for headaches.  Endo/Heme/Allergies: Negative.   Psychiatric/Behavioral: Negative.     Objective:  Physical Exam  Constitutional: He is oriented to person, place, and time. He appears well-developed and well-nourished. No distress.  HENT:  Head: Normocephalic and atraumatic.  Nose: Nose normal.  Eyes: Conjunctivae and EOM are normal. Pupils are equal, round, and reactive to light.  Neck: Normal range of motion. Neck supple.  Cardiovascular: Normal rate, regular rhythm, normal heart sounds and intact distal pulses.   No murmur heard. Respiratory: Effort normal and breath   sounds normal. No respiratory distress. He has no wheezes.  GI: Soft. Bowel sounds are normal. He exhibits no distension. There is no tenderness.  Musculoskeletal:       Left knee: He exhibits decreased range of motion, swelling and bony tenderness. He exhibits no effusion, no ecchymosis, no deformity, no laceration, no erythema, no LCL laxity and no MCL laxity. Tenderness  found. Medial joint line and lateral joint line tenderness noted.  Lymphadenopathy:    He has no cervical adenopathy.  Neurological: He is alert and oriented to person, place, and time. No cranial nerve deficit.  Skin: Skin is warm and dry. No rash noted. No erythema.  Psychiatric: He has a normal mood and affect. His behavior is normal.    Vital signs in last 24 hours: @VSRANGES@  Labs:   Estimated body mass index is 40.16 kg/(m^2) as calculated from the following:   Height as of 11/01/11: 6' (1.829 m).   Weight as of 11/01/11: 134.355 kg (296 lb 3.2 oz).   Imaging Review Plain radiographs demonstrate severe degenerative joint disease of the left knee(s). The overall alignment ismild varus. The bone quality appears to be good for age and reported activity level.  Assessment/Plan:  End stage arthritis, left knee   The patient history, physical examination, clinical judgment of the provider and imaging studies are consistent with end stage degenerative joint disease of the left knee(s) and total knee arthroplasty is deemed medically necessary. The treatment options including medical management, injection therapy arthroscopy and arthroplasty were discussed at length. The risks and benefits of total knee arthroplasty were presented and reviewed. The risks due to aseptic loosening, infection, stiffness, patella tracking problems, thromboembolic complications and other imponderables were discussed. The patient acknowledged the explanation, agreed to proceed with the plan and consent was signed. Patient is being admitted for inpatient treatment for surgery, pain control, PT, OT, prophylactic antibiotics, VTE prophylaxis, progressive ambulation and ADL's and discharge planning. The patient is planning to be discharged home with home health services   

## 2013-02-22 NOTE — Progress Notes (Signed)
02/22/13 Set up with HHPT with [facility] by MD office. CM will follow for equipment needs. Jacquelynn Cree RN, BSN, CCM

## 2013-02-22 NOTE — Brief Op Note (Signed)
02/22/2013  1:11 PM  PATIENT:  Ricky Blackwell  52 y.o. male  PRE-OPERATIVE DIAGNOSIS:  OA LEFT KNEE  POST-OPERATIVE DIAGNOSIS:  osteoarthritis left knee  PROCEDURE:  Procedure(s): LEFT TOTAL KNEE ARTHROPLASTY (Left)  SURGEON:  Surgeon(s) and Role:    * W D Carloyn Manner., MD - Primary  PHYSICIAN ASSISTANT: Margart Sickles, PA-C  ASSISTANTS:  ANESTHESIA:   regional and general  EBL:  Total I/O In: 1500 [I.V.:1500] Out: 350 [Urine:300; Blood:50]  BLOOD ADMINISTERED:none  DRAINS: none   LOCAL MEDICATIONS USED:  NONE  SPECIMEN:  No Specimen  DISPOSITION OF SPECIMEN:  N/A  COUNTS:  YES  TOURNIQUET:   Total Tourniquet Time Documented: Thigh (Left) - 84 minutes Total: Thigh (Left) - 84 minutes   DICTATION: .Other Dictation: Dictation Number unknown  PLAN OF CARE: Admit to inpatient   PATIENT DISPOSITION:  PACU - hemodynamically stable.   Delay start of Pharmacological VTE agent (>24hrs) due to surgical blood loss or risk of bleeding: yes

## 2013-02-22 NOTE — Transfer of Care (Signed)
Immediate Anesthesia Transfer of Care Note  Patient: Ricky Blackwell  Procedure(s) Performed: Procedure(s): LEFT TOTAL KNEE ARTHROPLASTY (Left)  Patient Location: PACU  Anesthesia Type:General  Level of Consciousness: awake, alert , oriented and patient cooperative  Airway & Oxygen Therapy: Patient Spontanous Breathing and Patient connected to nasal cannula oxygen  Post-op Assessment: Report given to PACU RN, Post -op Vital signs reviewed and stable and Patient moving all extremities X 4  Post vital signs: Reviewed and stable  Complications: No apparent anesthesia complications

## 2013-02-22 NOTE — Progress Notes (Signed)
Orthopedic Tech Progress Note Patient Details:  Ricky Blackwell 1960-07-29 [id number]  Patient ID: Ricky Blackwell, male   DOB: 06-Mar-1961, 52 y.o.   MRN: [id number] Viewed order from doctor's order list  Ricky Blackwell 02/22/2013, 2:22 PM

## 2013-02-22 NOTE — Progress Notes (Signed)
02/22/13 Set up with HHPT with [facility] by MD office. Spoke with Kipp Brood with [employer], patient  set up with CPM and if needed rolling walker and 3N1.  Jacquelynn Cree RN, BSN, CCM

## 2013-02-22 NOTE — Anesthesia Procedure Notes (Signed)
Anesthesia Regional Block:  Femoral nerve block  Pre-Anesthetic Checklist: ,, timeout performed, Correct Patient, Correct Site, Correct Laterality, Correct Procedure, Correct Position, site marked, Risks and benefits discussed,  Surgical consent,  Pre-op evaluation,  At surgeon's request and post-op pain management  Laterality: Left  Prep: chloraprep       Needles:  Injection technique: Single-shot  Needle Type: Echogenic Stimulator Needle     Needle Length: 5cm 5 cm Needle Gauge: 22 and 22 G    Additional Needles:  Procedures: nerve stimulator Femoral nerve block  Nerve Stimulator or Paresthesia:  Response: 0.48 mA,   Additional Responses:   Narrative:  Start time: 02/22/2013 9:20 AM End time: 02/22/2013 9:29 AM Injection made incrementally with aspirations every 5 mL. Anesthesiologist: Dr Gypsy Balsam  Additional Notes: 4540-9811 L FNB POP CHG prep, sterile tech #22 stim needle w/stim down to .48ma Multiple neg asp Marc .5% w/epi 1:200000 25cc+decadron 4mg  infiltrated No compl Dr Gypsy Balsam

## 2013-02-22 NOTE — Evaluation (Signed)
Physical Therapy Evaluation Patient Details Name: BEXTON HAAK MRN: [id number] DOB: 1960-12-19 Today's Date: 02/22/2013 Time: 4098-1191 PT Time Calculation (min): 34 min  PT Assessment / Plan / Recommendation History of Present Illness  Patient is a 52 yo male s/p Lt TKA.  Clinical Impression  Patient presents with problems listed below.  Will benefit from acute PT to maximize independence prior to discharge home with wife.    PT Assessment  Patient needs continued PT services    Follow Up Recommendations  Home health PT;Supervision/Assistance - 24 hour    Does the patient have the potential to tolerate intense rehabilitation      Barriers to Discharge        Equipment Recommendations  Rolling walker with 5" wheels    Recommendations for Other Services     Frequency 7X/week    Precautions / Restrictions Precautions Precautions: Knee Precaution Booklet Issued: Yes (comment) Precaution Comments: Reviewed precautions with patient and wife Required Braces or Orthoses: Knee Immobilizer - Left Knee Immobilizer - Left: On when out of bed or walking Restrictions Weight Bearing Restrictions: Yes LLE Weight Bearing: Weight bearing as tolerated   Pertinent Vitals/Pain       Mobility  Bed Mobility Bed Mobility: Supine to Sit;Sitting - Scoot to Edge of Bed Supine to Sit: 4: Min assist;With rails;HOB elevated Sitting - Scoot to Edge of Bed: 4: Min guard;With rail Details for Bed Mobility Assistance: Instructed patient on donning KI on LLE.  Verbal cues for technique for mobility and use of UE's to push up to sitting.  Assist to move LLE off of bed.  Good balance in sitting. Transfers Transfers: Sit to Stand;Stand to [employer] Transfers Sit to Stand: 4: Min assist;With upper extremity assist;From bed Stand to Sit: 4: Min assist;With upper extremity assist;With armrests;To chair/3-in-1 Stand Pivot Transfers: 4: Min assist Details for Transfer Assistance: Verbal and  visual cues for safe use of RW and hand placement.  Patient able to take several steps to pivot to chair with cues for sequence. Ambulation/Gait Ambulation/Gait Assistance: Not tested (comment)    Exercises Total Joint Exercises Ankle Circles/Pumps: AROM;Both;10 reps;Seated   PT Diagnosis: Difficulty walking;Acute pain  PT Problem List: Decreased strength;Decreased range of motion;Decreased activity tolerance;Decreased balance;Decreased mobility;Decreased knowledge of use of DME;Decreased knowledge of precautions;Obesity;Pain PT Treatment Interventions: DME instruction;Gait training;Stair training;Functional mobility training;Therapeutic exercise;Patient/family education     PT Goals(Current goals can be found in the care plan section) Acute Rehab PT Goals Patient Stated Goal: To get up my steps. PT Goal Formulation: With patient/family Time For Goal Achievement: 03/01/13 Potential to Achieve Goals: Good  Visit Information  Last PT Received On: 02/22/13 Assistance Needed: +1 History of Present Illness: Patient is a 52 yo male s/p Lt TKA.       Prior Functioning  Home Living Family/patient expects to be discharged to:: Private residence Living Arrangements: Spouse/significant other Available Help at Discharge: Family;Available 24 hours/day Type of Home: Mobile home Home Access: Stairs to enter Entrance Stairs-Number of Steps: 6 Entrance Stairs-Rails: Right;Left Home Layout: One level Home Equipment: Cane - single point;Crutches;Bedside commode Prior Function Level of Independence: Independent (Worked day prior to surgery) Communication Communication: No difficulties    Cognition  Cognition Arousal/Alertness: Awake/alert Behavior During Therapy: WFL for tasks assessed/performed Overall Cognitive Status: Within Functional Limits for tasks assessed    Extremity/Trunk Assessment Upper Extremity Assessment Upper Extremity Assessment: Overall WFL for tasks assessed Lower  Extremity Assessment Lower Extremity Assessment: LLE deficits/detail LLE Deficits / Details: Decreased  strength and ROM due to surgery/pain.  Patient able to assist with moving LLE off of bed. LLE: Unable to fully assess due to pain Cervical / Trunk Assessment Cervical / Trunk Assessment: Normal   Balance Balance Balance Assessed: Yes Static Sitting Balance Static Sitting - Balance Support: No upper extremity supported;Feet supported Static Sitting - Level of Assistance: 5: Stand by assistance Static Sitting - Comment/# of Minutes: 3 Static Standing Balance Static Standing - Balance Support: Bilateral upper extremity supported Static Standing - Level of Assistance: 4: Min assist Static Standing - Comment/# of Minutes: 2  End of Session PT - End of Session Equipment Utilized During Treatment: Gait belt;Left knee immobilizer Activity Tolerance: Patient limited by pain;Patient limited by fatigue Patient left: in chair;with call bell/phone within reach;with family/visitor present Nurse Communication: Mobility status CPM Left Knee CPM Left Knee: Off  GP     Vena Austria 02/22/2013, 7:46 PM Durenda Hurt. Renaldo Fiddler, [facility] Acute Rehab Services Pager [phone]

## 2013-02-22 NOTE — Addendum Note (Signed)
Addendum created 02/22/13 1426 by Bishop Limbo, CRNA   Modules edited: Anesthesia Medication Administration

## 2013-02-23 LAB — CBC
Hemoglobin: 12.8 g/dL — ABNORMAL LOW (ref 13.0–17.0)
MCH: 30.6 pg (ref 26.0–34.0)
MCHC: 34.3 g/dL (ref 30.0–36.0)
MCV: 89.2 fL (ref 78.0–100.0)
RBC: 4.18 MIL/uL — ABNORMAL LOW (ref 4.22–5.81)

## 2013-02-23 LAB — BASIC METABOLIC PANEL
BUN: 12 mg/dL (ref 6–23)
CO2: 24 mEq/L (ref 19–32)
Calcium: 8.5 mg/dL (ref 8.4–10.5)
Creatinine, Ser: 0.77 mg/dL (ref 0.50–1.35)
GFR calc Af Amer: >90 mL/min (ref >90)
GFR calc non Af Amer: >90 mL/min (ref >90)
Glucose, Bld: 166 mg/dL — ABNORMAL HIGH (ref 70–99)
Sodium: 136 mEq/L (ref 135–145)

## 2013-02-23 NOTE — Progress Notes (Signed)
    Subjective:  Patient reports pain as Mild  Objective:   VITALS:   Filed Vitals:   02/22/13 1548 02/22/13 1954 02/23/13 0213 02/23/13 0545  BP:  141/77 126/63 118/61  Pulse:  82 91 80  Temp:  99.2 F (37.3 C) 99.2 F (37.3 C) 98.3 F (36.8 C)  TempSrc:  Oral Oral Oral  Resp: 12 16 14 16   SpO2:  95% 92% 92%    Physical Exam  Dressing: C/D/I  Compartments soft  SILT DP/SP/S/S/T, 2+DP, +TA/GS/EHL  LABS  Results for orders placed during the hospital encounter of 02/22/13 (from the past 24 hour(s))  CBC     Status: Abnormal   Collection Time    02/22/13  4:25 PM      Result Value Range   WBC 13.8 (*) 4.0 - 10.5 K/uL   RBC 4.62  4.22 - 5.81 MIL/uL   Hemoglobin 14.6  13.0 - 17.0 g/dL   HCT 41.5  39.0 - 52.0 %   MCV 89.8  78.0 - 100.0 fL   MCH 31.6  26.0 - 34.0 pg   MCHC 35.2  30.0 - 36.0 g/dL   RDW 12.7  11.5 - 15.5 %   Platelets 221  150 - 400 K/uL  CREATININE, SERUM     Status: None   Collection Time    02/22/13  4:25 PM      Result Value Range   Creatinine, Ser 0.70  0.50 - 1.35 mg/dL   GFR calc non Af Amer >90  >90 mL/min   GFR calc Af Amer >90  >90 mL/min  CBC     Status: Abnormal   Collection Time    02/23/13  4:03 AM      Result Value Range   WBC 13.1 (*) 4.0 - 10.5 K/uL   RBC 4.18 (*) 4.22 - 5.81 MIL/uL   Hemoglobin 12.8 (*) 13.0 - 17.0 g/dL   HCT 37.3 (*) 39.0 - 52.0 %   MCV 89.2  78.0 - 100.0 fL   MCH 30.6  26.0 - 34.0 pg   MCHC 34.3  30.0 - 36.0 g/dL   RDW 12.7  11.5 - 15.5 %   Platelets 254  150 - 400 K/uL  BASIC METABOLIC PANEL     Status: Abnormal   Collection Time    02/23/13  4:03 AM      Result Value Range   Sodium 136  135 - 145 mEq/L   Potassium 4.2  3.5 - 5.1 mEq/L   Chloride 102  96 - 112 mEq/L   CO2 24  19 - 32 mEq/L   Glucose, Bld 166 (*) 70 - 99 mg/dL   BUN 12  6 - 23 mg/dL   Creatinine, Ser 0.77  0.50 - 1.35 mg/dL   Calcium 8.5  8.4 - 10.5 mg/dL   GFR calc non Af Amer >90  >90 mL/min   GFR calc Af Amer >90  >90 mL/min      Assessment/Plan: 1 Day Post-Op   Active Problems:   * No active hospital problems. *   PLAN: Weight Bearing: WBAT LLE Dressings: Maintain Hgb: stable VTE prophylaxis: SCD's, ambulation, Lovenox 30 BID Dispo: Home with HHPT likely Sunday    Elsi Stelzer, Christia Reading, D 02/23/2013, 8:28 AM   Edmonia Lynch, MD Cell [phone]

## 2013-02-23 NOTE — Evaluation (Signed)
Occupational Therapy Evaluation Patient Details Name: Ricky Blackwell MRN: [id number] DOB: Aug 08, 1960 Today's Date: 02/23/2013 Time: 4081-4481 OT Time Calculation (min): 32 min  OT Assessment / Plan / Recommendation History of present illness Patient is a 52 yo male s/p Lt TKA.   Clinical Impression   Pt s/p Lt TKA.  Will benefit from continued acute OT services to address below problem list in prep for return home with wife.     OT Assessment  Patient needs continued OT Services    Follow Up Recommendations  No OT follow up;Supervision/Assistance - 24 hour    Barriers to Discharge      Equipment Recommendations   (tub DME TBD)    Recommendations for Other Services    Frequency  Min 2X/week    Precautions / Restrictions Precautions Precautions: Knee Precaution Booklet Issued: Yes (comment) Precaution Comments: Reviewed precautions with patient and wife Required Braces or Orthoses: Knee Immobilizer - Left Knee Immobilizer - Left: On when out of bed or walking Restrictions Weight Bearing Restrictions: Yes LLE Weight Bearing: Weight bearing as tolerated   Pertinent Vitals/Pain See vitals    ADL  Eating/Feeding: Performed;Independent Where Assessed - Eating/Feeding: Chair Grooming: Performed;Wash/dry face;Min guard Where Assessed - Grooming: Supported standing Upper Body Bathing: Simulated;Set up Where Assessed - Upper Body Bathing: Unsupported sitting Lower Body Bathing: Simulated;Minimal assistance Where Assessed - Lower Body Bathing: Unsupported sit to stand Upper Body Dressing: Performed;Set up Where Assessed - Upper Body Dressing: Unsupported sitting Lower Body Dressing: Performed;Moderate assistance Where Assessed - Lower Body Dressing: Unsupported sit to stand Toilet Transfer: Performed;Min guard Toilet Transfer Method: Sit to Loss adjuster, chartered: Raised toilet seat with arms (or 3-in-1 over toilet) Toileting - Clothing Manipulation and Hygiene:  Performed;Min guard Where Assessed - Camera operator Manipulation and Hygiene: Sit to stand from 3-in-1 or toilet Equipment Used: Knee Immobilizer;Rolling walker;Gait belt Transfers/Ambulation Related to ADLs: min guard with RW ADL Comments: Pt ambulated to bathroom and performed toileting tasks at min guard level.  Educated pt on LB dressing technique of threading LLE through clothing first, and reverse process for doffing. Assist for LB ADLs due to decreased L knee ROM.    OT Diagnosis: Acute pain;Generalized weakness  OT Problem List: Decreased strength;Decreased activity tolerance;Decreased knowledge of use of DME or AE;Pain OT Treatment Interventions: Self-care/ADL training;DME and/or AE instruction;Therapeutic activities;Patient/family education   OT Goals(Current goals can be found in the care plan section) Acute Rehab OT Goals Patient Stated Goal: to return home OT Goal Formulation: With patient Time For Goal Achievement: 03/02/13 Potential to Achieve Goals: Good  Visit Information  Last OT Received On: 02/23/13 Assistance Needed: +1 History of Present Illness: Patient is a 52 yo male s/p Lt TKA.       Prior [place] expects to be discharged to:: Private residence Living Arrangements: Spouse/significant other Available Help at Discharge: Family;Available 24 hours/day Type of Home: Mobile home Home Access: Stairs to enter Entrance Stairs-Number of Steps: 6 Entrance Stairs-Rails: Right;Left Home Layout: One level Home Equipment: Cane - single point;Crutches;Bedside commode Prior Function Level of Independence: Independent         Vision/Perception     Cognition  Cognition Arousal/Alertness: Awake/alert Behavior During Therapy: WFL for tasks assessed/performed Overall Cognitive Status: Within Functional Limits for tasks assessed    Extremity/Trunk Assessment Upper Extremity Assessment Upper Extremity Assessment: Overall  WFL for tasks assessed     Mobility Bed Mobility Bed Mobility: Not assessed Supine to Sit:  4: Min assist;With rails;HOB elevated Details for Bed Mobility Assistance: pt up in chair Transfers Transfers: Sit to Stand;Stand to Sit Sit to Stand: 4: Min guard;From chair/3-in-1;With armrests Stand to Sit: 4: Min guard;To chair/3-in-1;With armrests;With upper extremity assist Details for Transfer Assistance: VCs for sequencing     Exercise    Balance     End of Session OT - End of Session Equipment Utilized During Treatment: Gait belt;Rolling walker;Left knee immobilizer Activity Tolerance: Patient tolerated treatment well Patient left: with call bell/phone within reach;in chair;with family/visitor present Nurse Communication: Mobility status CPM Left Knee Additional Comments: Trapeze bar  GO    02/23/2013 Darrol Jump OTR/L Pager [phone] Office [phone]  Darrol Jump 02/23/2013, 10:07 AM

## 2013-02-23 NOTE — Progress Notes (Signed)
Physical Therapy Treatment Patient Details Name: CLAUDIA ALVIZO MRN: [id number] DOB: Oct 30, 1960 Today's Date: 02/23/2013 Time: 4098-1191 PT Time Calculation (min): 17 min  PT Assessment / Plan / Recommendation  History of Present Illness Patient is a 52 yo male s/p Lt TKA.   PT Comments   Patient is making good progress with PT.  From a mobility standpoint anticipate patient will be ready for DC home tomorrow .   Will practice stairs in the AM.    Follow Up Recommendations  Home health PT;Supervision/Assistance - 24 hour     Does the patient have the potential to tolerate intense rehabilitation     Barriers to Discharge        Equipment Recommendations  Rolling walker with 5" wheels    Recommendations for Other Services    Frequency 7X/week   Progress towards PT Goals Progress towards PT goals: Progressing toward goals  Plan Current plan remains appropriate    Precautions / Restrictions Precautions Precautions: Knee Required Braces or Orthoses: Knee Immobilizer - Left Knee Immobilizer - Left: On when out of bed or walking Restrictions Weight Bearing Restrictions: Yes LLE Weight Bearing: Weight bearing as tolerated   Pertinent Vitals/Pain 4/10 in LLE.  Pt reports he feels like his nerve block wore off after his last PT session and was having increased pain.      Mobility  Bed Mobility Bed Mobility: Supine to Sit Supine to Sit: 4: Min assist Details for Bed Mobility Assistance: Assist with LLE Transfers Transfers: Sit to Stand;Stand to Sit Sit to Stand: With upper extremity assist;From bed;5: Supervision Stand to Sit: With upper extremity assist;To chair/3-in-1;With armrests;5: Supervision Ambulation/Gait Ambulation/Gait Assistance: 5: Supervision Ambulation Distance (Feet): 80 Feet Assistive device: Rolling walker Gait Pattern: Step-to pattern;Decreased step length - right Gait velocity: decreased    Exercises Total Joint Exercises Quad Sets: AROM;Left;10  reps;Supine (Discussed importnance of full knee extension w pt)   PT Diagnosis:    PT Problem List:   PT Treatment Interventions:     PT Goals (current goals can now be found in the care plan section)    Visit Information  Last PT Received On: 02/23/13 Assistance Needed: +1 History of Present Illness: Patient is a 52 yo male s/p Lt TKA.    Subjective Data      Cognition  Cognition Arousal/Alertness: Awake/alert Behavior During Therapy: WFL for tasks assessed/performed Overall Cognitive Status: Within Functional Limits for tasks assessed    Balance     End of Session PT - End of Session Equipment Utilized During Treatment: Gait belt;Left knee immobilizer Activity Tolerance: Patient tolerated treatment well Patient left: in chair;with call bell/phone within reach   GP     Donnella Sham 02/23/2013, 2:49 PM Lavona Mound, PT  [phone] 02/23/2013

## 2013-02-23 NOTE — Op Note (Signed)
NAME:  Ricky Blackwell, Ricky Blackwell NO.:  [id number]  MEDICAL RECORD NO.:  [id number]  LOCATION:  [id number]                        FACILITY:  [facility]  PHYSICIAN:  Lockie Pares, M.D.    DATE OF BIRTH:  [birth year]  DATE OF PROCEDURE:  02/22/2013 DATE OF DISCHARGE:                              OPERATIVE REPORT   PREOPERATIVE DIAGNOSIS:  Severe osteoarthritis, left knee with varus deformity.  POSTOPERATIVE DIAGNOSIS:  Severe osteoarthritis, left knee with varus deformity.  OPERATION:  Left total knee replacement (Sigma cemented size 5 femur tibia 10 mm bearing and 38 mm all poly patella).  SURGEON:  Lockie Pares, M.D.  ASSISTANT:  Chriss Czar, PA-C.  TOURNIQUET TIME:  75 minutes.  DESCRIPTION OF PROCEDURE:  Sterile prep and drape, exsanguination of leg, inflation to 350.  Straight skin incision, medial parapatellar approach to the knee made.  We cut 11 mm of distal femur, 5 degree valgus cut, followed by cutting of the tibia about 4 mm below the most diseased medial compartment.  We did hold revision cut due to posterior tightness of the tibia to increase posterior slope.  Extension gap was measured at 10 and 12.5 mm eventually set only 10 mm bearing.  We sized the femur to be a size 5, followed by placement of the pins for the anterior, posterior, chamfer cuts, which were accomplished without difficulty.  The flexion gap eventually equalled the extension gap at 10 mm.  Significant structural tightness was noted in the knees specifically related to posterior tightness and varus requiring extensive medial and posterior releases, complete release of the PCL and stripping of the posterior capsule.  Keel hole was cut for the tibia and the box cut for the femur.  Trial was next placed.  Patella was cut leaving about 17 mm of native patella and a trial patella placed with trials as well.  All trials were deemed to be acceptable and the final components were inserted in the  tibia followed by femur and patella.  We did place a trial bearing.  We elected on the 10 bearing, was thought to replicate the kinematics well with full extension, good stability, no tendency to spin out, and good tracking of the patella as well.  We put the final component in of all poly after first checking the posterior aspect of the knee for cement.  Once that was done, then we released the tourniquet and checked for any excessive bleeding, and none was noted, and the final bearing was placed.  Once we placed the final bearing and checked the range of motion as we closed the capsule, we noted that there was some clunking along the lateral aspect of the knee. I was unclear what was causing this.  We then removed the bearing.  We trialed a 12.5 as well, but we actually found a lateral osteophyte on the femur, which once we removed that the clunking that was noted on the lateral aspect of the knee was gone.  The enclosure was effected with #1 Ethibond, 2-0 Vicryl, and skin clips.  Lightly compressive sterile dressing and knee immobilizer applied.  Taken to the recovery room in a stable condition.  Lockie Pares, M.D.     WDC/MEDQ  D:  02/22/2013  T:  02/23/2013  Job:  [id number]

## 2013-02-23 NOTE — Progress Notes (Signed)
Physical Therapy Treatment Patient Details Name: ZYMIR NAPOLI MRN: [id number] DOB: December 22, 1960 Today's Date: 02/23/2013 Time: 4098-1191 PT Time Calculation (min): 40 min  PT Assessment / Plan / Recommendation  History of Present Illness Patient is a 52 yo male s/p Lt TKA.   PT Comments   Pt progressing well with mobility.  Wife present for entire session. From a mobility standpoint will likely be ready for DC on Sunday.     Follow Up Recommendations  Home health PT;Supervision/Assistance - 24 hour     Does the patient have the potential to tolerate intense rehabilitation     Barriers to Discharge        Equipment Recommendations  Rolling walker with 5" wheels    Recommendations for Other Services    Frequency 7X/week   Progress towards PT Goals Progress towards PT goals: Progressing toward goals  Plan Current plan remains appropriate    Precautions / Restrictions Precautions Precautions: Knee Precaution Comments: Reviewed precautions with patient and wife Required Braces or Orthoses: Knee Immobilizer - Left Knee Immobilizer - Left: On when out of bed or walking Restrictions Weight Bearing Restrictions: Yes LLE Weight Bearing: Weight bearing as tolerated   Pertinent Vitals/Pain 7/10 left knee pain after exercises. Pt had pain meds about 1 hour prior to PT session.      Mobility  Bed Mobility Bed Mobility: Supine to Sit Supine to Sit: 4: Min assist;With rails;HOB elevated Details for Bed Mobility Assistance: Assist with LLE Transfers Transfers: Sit to Stand;Stand to Sit Sit to Stand: 4: Min guard;With upper extremity assist;From bed Stand to Sit: 4: Min guard;With upper extremity assist;To chair/3-in-1;With armrests Details for Transfer Assistance: Cues to put LLE in front and to not pull up on RW Ambulation/Gait Ambulation/Gait Assistance: 4: Min guard Ambulation Distance (Feet): 60 Feet Assistive device: Rolling walker Ambulation/Gait Assistance Details: cues  for technique Gait Pattern: Step-to pattern;Decreased step length - right Gait velocity: decreased    Exercises Total Joint Exercises Ankle Circles/Pumps: AROM;Both;5 reps;Supine Quad Sets: AROM;Left;10 reps;Supine Heel Slides: AAROM;Left;10 reps;Supine Hip ABduction/ADduction: AAROM;Left;10 reps;Supine Straight Leg Raises: AAROM;Left;10 reps;Sidelying Long Arc Quad: AAROM;Left;10 reps;Seated Knee Flexion: AAROM;Left;5 reps;Seated Goniometric ROM: 78 degrees flexion   PT Diagnosis:    PT Problem List:   PT Treatment Interventions:     PT Goals (current goals can now be found in the care plan section)    Visit Information  Last PT Received On: 02/23/13 Assistance Needed: +1 History of Present Illness: Patient is a 52 yo male s/p Lt TKA.    Subjective Data      Cognition  Cognition Arousal/Alertness: Awake/alert Behavior During Therapy: WFL for tasks assessed/performed Overall Cognitive Status: Within Functional Limits for tasks assessed    Balance     End of Session PT - End of Session Equipment Utilized During Treatment: Gait belt;Left knee immobilizer Activity Tolerance: Patient tolerated treatment well Patient left: in chair;with call bell/phone within reach;with family/visitor present Nurse Communication: Mobility status CPM Left Knee Additional Comments: Trapeze bar   GP     Donnella Sham 02/23/2013, 8:39 AM Lavona Mound, PT  [phone] 02/23/2013

## 2013-02-24 LAB — CBC
HCT: 32.4 % — ABNORMAL LOW (ref 39.0–52.0)
MCH: 30.3 pg (ref 26.0–34.0)
MCHC: 33.3 g/dL (ref 30.0–36.0)
MCV: 90.8 fL (ref 78.0–100.0)
Platelets: 224 10*3/uL (ref 150–400)
RDW: 13 % (ref 11.5–15.5)

## 2013-02-24 NOTE — Progress Notes (Signed)
   CARE MANAGEMENT NOTE 02/24/2013  Patient:  Ricky Blackwell, Ricky Blackwell   Account Number:  [id number]  Date Initiated:  02/22/2013  Documentation initiated by:  [facility]  Subjective/Objective Assessment:   admitted postop left TKA     Action/Plan:   Plan HHPT   Anticipated DC Date:  02/24/2013   Anticipated DC Plan:  HOME W HOME HEALTH SERVICES      DC Planning Services  CM consult      Choice offered to / List presented to:     DME arranged  CPM  3-N-1  WALKER - ROLLING      DME agency  [employer]     HH arranged  HH-2 PT      HH agency  [facility]   Status of service:  Completed, signed off Medicare Important Message given?   (If response is "NO", the following Medicare IM given date fields will be blank) Date Medicare IM given:   Date Additional Medicare IM given:    Discharge Disposition:  HOME W HOME HEALTH SERVICES  Per UR Regulation:    If discussed at Long Length of Stay Meetings, dates discussed:    Comments:  02/24/13 09:05 Cm notified [facility] of pt discharge.  No other CM needs were communicated.  Freddy Jaksch, BSN, Caryl Ada [phone].  02/22/13 Set up with HHPT with [facility] by MD office. Spoke with Kipp Brood with [employer], patient  set up with CPM and if needed rolling walker and 3N1.  Jacquelynn Cree RN, BSN, CCM

## 2013-02-24 NOTE — Progress Notes (Signed)
Physical Therapy Treatment Patient Details Name: Ricky Blackwell MRN: [id number] DOB: 1960/11/20 Today's Date: 02/24/2013 Time: 5621-3086 PT Time Calculation (min): 38 min  PT Assessment / Plan / Recommendation  History of Present Illness Patient is a 52 yo male s/p Lt TKA.   PT Comments   Pt. Able to ambulate most of the way to gym before he fatigued and sat in recliner for brief rest.  He practiced negotiating steps with 2 rails and min guard assist (5 steps X 2 trials).  He is making good progress with mobility and exercises and will need to continue with HHPT following DC home (has DC orders) .  He has met acute goals.    Follow Up Recommendations  Home health PT;Supervision/Assistance - 24 hour     Does the patient have the potential to tolerate intense rehabilitation     Barriers to Discharge        Equipment Recommendations  Rolling walker with 5" wheels    Recommendations for Other Services    Frequency 7X/week   Progress towards PT Goals Progress towards PT goals: Goals met/education completed, patient discharged from PT  Plan Current plan remains appropriate    Precautions / Restrictions Precautions Precautions: Knee Required Braces or Orthoses: Knee Immobilizer - Left Knee Immobilizer - Left: On when out of bed or walking Restrictions Weight Bearing Restrictions: Yes LLE Weight Bearing: Weight bearing as tolerated   Pertinent Vitals/Pain See vitals tab     Mobility  Bed Mobility Bed Mobility: Supine to Sit Supine to Sit: 5: Supervision;HOB elevated;With rails Sitting - Scoot to Edge of Bed: 5: Supervision Details for Bed Mobility Assistance: managing won L LE today with increased time Transfers Transfers: Sit to Stand;Stand to Sit Sit to Stand: 5: Supervision;With upper extremity assist;From bed Stand to Sit: 5: Supervision;With upper extremity assist;With armrests;To chair/3-in-1 Details for Transfer Assistance: reminder for safest hand  placement Ambulation/Gait Ambulation/Gait Assistance: 5: Supervision Ambulation Distance (Feet): 120 Feet Assistive device: Rolling walker Ambulation/Gait Assistance Details: good sequencing and technique and no overt LOB this am Gait Pattern: Step-to pattern;Decreased step length - right Gait velocity: decreased Stairs: Yes Stairs Assistance: 4: Min guard Stair Management Technique: Two rails;Step to pattern;Forwards Number of Stairs: 5 (2 trials of 5 steps)    Exercises Total Joint Exercises Ankle Circles/Pumps: AROM;Both;10 reps;Supine Quad Sets: AROM;Left;10 reps;Supine Short Arc Quad: AAROM;Left;10 reps;Supine Hip ABduction/ADduction: AAROM;Left;10 reps;Supine Straight Leg Raises: AAROM;Left;10 reps;Supine Long Arc Quad: AAROM;Left;10 reps;Seated Knee Flexion: AAROM;Left;5 reps;Seated Goniometric ROM: 80 flexion   PT Diagnosis:    PT Problem List:   PT Treatment Interventions:     PT Goals (current goals can now be found in the care plan section) Acute Rehab PT Goals Patient Stated Goal: to return home PT Goal Formulation: With patient  Visit Information  Last PT Received On: 02/24/13 Assistance Needed: +1 History of Present Illness: Patient is a 52 yo male s/p Lt TKA.    Subjective Data  Subjective: "I had pain medicine a while ago but I still hurt"   (RN made aware) Patient Stated Goal: to return home   Cognition  Cognition Arousal/Alertness: Awake/alert Behavior During Therapy: WFL for tasks assessed/performed Overall Cognitive Status: Within Functional Limits for tasks assessed    Balance     End of Session PT - End of Session Equipment Utilized During Treatment: Gait belt;Left knee immobilizer Activity Tolerance: Patient tolerated treatment well Patient left: in chair;with call bell/phone within reach;Other (comment) (in gym with OT Eileen Stanford) Nurse Communication:  Mobility status;Patient requests pain meds   GP     Ferman Hamming 02/24/2013,  10:42 AM Weldon Picking PT Acute Rehab Services [phone] [place] [phone]

## 2013-02-24 NOTE — Discharge Summary (Signed)
Physician Discharge Summary  Patient ID: Ricky Blackwell MRN: [id number] DOB/AGE: Feb 11, 1961 52 y.o.  Admit date: 02/22/2013 Discharge date: 02/24/2013  Admission Diagnoses:  <principal problem not specified>  Discharge Diagnoses:  Active Problems:   * No active hospital problems. *   Past Medical History  Diagnosis Date  . Irritable bowel syndrome   . Colitis   . Rectal bleeding   . Unspecified constipation   . Anal stenosis   . Crohn's disease   . Pneumonia     hx  . History of kidney stones   . H/O hiatal hernia   . Arthritis     Surgeries: Procedure(s): LEFT TOTAL KNEE ARTHROPLASTY on 02/22/2013   Consultants (if any):    Discharged Condition: Improved  Hospital Course: Ricky Blackwell is an 52 y.o. male who was admitted 02/22/2013 with a diagnosis of <principal problem not specified> and went to the operating room on 02/22/2013 and underwent the above named procedures.    He was given perioperative antibiotics:  Anti-infectives   Start     Dose/Rate Route Frequency Ordered Stop   02/22/13 1600  ceFAZolin (ANCEF) IVPB 2 g/50 mL premix     2 g 100 mL/hr over 30 Minutes Intravenous Every 6 hours 02/22/13 1439 02/22/13 2136   02/22/13 0600  ceFAZolin (ANCEF) 3 g in dextrose 5 % 50 mL IVPB     3 g 160 mL/hr over 30 Minutes Intravenous On call to O.R. 02/21/13 1425 02/22/13 1019    .  He was given sequential compression devices, early ambulation, and Lovenox 30bid for DVT prophylaxis.  He benefited maximally from the hospital stay and there were no complications.    Recent vital signs:  Filed Vitals:   02/24/13 0632  BP: 134/73  Pulse: 77  Temp: 99 F (37.2 C)  Resp: 18    Recent laboratory studies:  Lab Results  Component Value Date   HGB 10.8* 02/24/2013   HGB 12.8* 02/23/2013   HGB 14.6 02/22/2013   Lab Results  Component Value Date   WBC 14.9* 02/24/2013   PLT 224 02/24/2013   Lab Results  Component Value Date   INR 1.06 02/12/2013    Lab Results  Component Value Date   NA 136 02/23/2013   K 4.2 02/23/2013   CL 102 02/23/2013   CO2 24 02/23/2013   BUN 12 02/23/2013   CREATININE 0.77 02/23/2013   GLUCOSE 166* 02/23/2013    Discharge Medications:     Medication List    STOP taking these medications       oxyCODONE-acetaminophen 10-325 MG per tablet  Commonly known as:  PERCOCET      TAKE these medications       enoxaparin 30 MG/0.3ML injection  Commonly known as:  LOVENOX  Inject 0.3 mLs (30 mg total) into the skin every 12 (twelve) hours.     mesalamine 1.2 G EC tablet  Commonly known as:  LIALDA  Take 4 tablets every morning     methocarbamol 500 MG tablet  Commonly known as:  ROBAXIN  Take 1 tablet (500 mg total) by mouth every 6 (six) hours as needed for muscle spasms.     oxyCODONE 5 MG immediate release tablet  Commonly known as:  ROXICODONE  1-2 tabs po q4-6hrs prn pain     polyethylene glycol packet  Commonly known as:  MIRALAX / GLYCOLAX  Take 17 g by mouth daily.        Diagnostic Studies: Dg Chest  2 View  02/12/2013   CLINICAL DATA:  Preoperative evaluation for left total knee arthroplasty, former smoker, history hiatal hernia, Crohn's disease  EXAM: CHEST  2 VIEW  COMPARISON:  None  FINDINGS: Normal heart size, mediastinal contours, and pulmonary vascularity.  Azygos fissure noted.  Minimal left basilar atelectasis or scarring.  Lungs otherwise Clear.  No pleural effusion or pneumothorax.  Prior cervical spine fusion.  No acute osseous findings.  IMPRESSION: Minimal left basilar atelectasis versus scarring 09/11/2008   Electronically Signed   By: Lavonia Dana M.D.   On: 02/12/2013 16:49    Disposition:       Discharge Orders   Future Orders Complete By Expires   Discharge patient  As directed       Follow-up Information   Follow up with CAFFREY JR,W D, MD. Schedule an appointment as soon as possible for a visit in 2 weeks.   Specialty:  Orthopedic Surgery   Contact  information:   [place] [address] [phone]        Signed: Renette Butters 02/24/2013, 8:45 AM

## 2013-02-24 NOTE — Progress Notes (Signed)
Occupational Therapy Treatment Patient Details Name: Ricky Blackwell MRN: [id number] DOB: 06/20/1960 Today's Date: 02/24/2013 Time: 9628-3662 OT Time Calculation (min): 24 min  OT Assessment / Plan / Recommendation  History of present illness Patient is a 52 yo male s/p Lt TKA.   OT comments  Pt progressing toward goals. Anticipate d/c home later today.  Follow Up Recommendations  No OT follow up;Supervision/Assistance - 24 hour    Barriers to Discharge       Equipment Recommendations  None recommended by OT (already has 3n1 per pt report)    Recommendations for Other Services    Frequency Min 2X/week   Progress towards OT Goals Progress towards OT goals: Progressing toward goals  Plan Discharge plan remains appropriate    Precautions / Restrictions Precautions Precautions: Knee Required Braces or Orthoses: Knee Immobilizer - Left Knee Immobilizer - Left: On when out of bed or walking Restrictions Weight Bearing Restrictions: Yes LLE Weight Bearing: Weight bearing as tolerated   Pertinent Vitals/Pain See vitals   ADL  Lower Body Dressing: Performed;Minimal assistance Where Assessed - Lower Body Dressing: Unsupported sit to stand Toilet Transfer: Simulated;Supervision/safety Toilet Transfer Method: Sit to Loss adjuster, chartered:  (chair) Toileting - Water quality scientist and Hygiene: Performed;Supervision/safety Where Assessed - Best boy and Hygiene: Sit to stand from 3-in-1 or toilet Equipment Used: Knee Immobilizer;Rolling walker;Gait belt Transfers/Ambulation Related to ADLs: supervision with RW ADL Comments: Pt able to pull undergarments up over hips without UE support for balance on RW.  Pt received in therapy gym after completing stair training with PT.  Demo'd tub transfer for pt using tub bench, shower chair, and 3n1.  Pt verbalized understanding, reports he will likely use 3n1 as shower chair at home. Plans to sponge bathe  initally.  Pt too fatigued and too painful after working with PT just prior to OT that he declined practicing tub transfer at this time.    OT Diagnosis:    OT Problem List:   OT Treatment Interventions:     OT Goals(current goals can now be found in the care plan section) Acute Rehab OT Goals Patient Stated Goal: to return home OT Goal Formulation: With patient Time For Goal Achievement: 03/02/13 Potential to Achieve Goals: Good ADL Goals Pt Will Transfer to Toilet: with supervision;ambulating Pt Will Perform Tub/Shower Transfer: with supervision;Tub transfer;ambulating;rolling walker Additional ADL Goal #1: Pt will be independent in use of AE for LB ADLs as needed.  Visit Information  Last OT Received On: 02/24/13 Assistance Needed: +1 History of Present Illness: Patient is a 52 yo male s/p Lt TKA.    Subjective Data      Prior Functioning       Cognition  Cognition Arousal/Alertness: Awake/alert Behavior During Therapy: WFL for tasks assessed/performed Overall Cognitive Status: Within Functional Limits for tasks assessed    Mobility  Bed Mobility Bed Mobility: Not assessed  Transfers Transfers: Sit to Stand;Stand to Sit Sit to Stand: 5: Supervision;From chair/3-in-1 Stand to Sit: 5: Supervision;To chair/3-in-1 Details for Transfer Assistance: Incr time for pain. No physical assist needed.    Exercises    Balance     End of Session OT - End of Session Equipment Utilized During Treatment: Gait belt;Rolling walker;Left knee immobilizer Activity Tolerance: Patient limited by fatigue;Patient limited by pain Patient left: in chair;with call bell/phone within reach  GO   02/24/2013 Darrol Jump OTR/L Pager [phone] Office [phone]   Darrol Jump 02/24/2013, 11:26 AM

## 2013-02-25 ENCOUNTER — Encounter (HOSPITAL_COMMUNITY): Payer: Self-pay | Admitting: Orthopedic Surgery

## 2013-03-11 ENCOUNTER — Ambulatory Visit (HOSPITAL_COMMUNITY): Payer: BC Managed Care – PPO | Attending: Internal Medicine

## 2013-03-11 ENCOUNTER — Encounter: Payer: Self-pay | Admitting: Internal Medicine

## 2013-03-11 DIAGNOSIS — Z87891 Personal history of nicotine dependence: Secondary | ICD-10-CM | POA: Insufficient documentation

## 2013-03-11 DIAGNOSIS — Z96659 Presence of unspecified artificial knee joint: Secondary | ICD-10-CM | POA: Insufficient documentation

## 2013-03-11 DIAGNOSIS — E669 Obesity, unspecified: Secondary | ICD-10-CM | POA: Insufficient documentation

## 2013-03-11 DIAGNOSIS — R609 Edema, unspecified: Secondary | ICD-10-CM

## 2013-03-11 DIAGNOSIS — M79609 Pain in unspecified limb: Secondary | ICD-10-CM

## 2013-03-18 ENCOUNTER — Ambulatory Visit: Payer: BC Managed Care – PPO | Attending: Orthopedic Surgery | Admitting: Physical Therapy

## 2013-03-18 DIAGNOSIS — M25569 Pain in unspecified knee: Secondary | ICD-10-CM | POA: Insufficient documentation

## 2013-03-18 DIAGNOSIS — R262 Difficulty in walking, not elsewhere classified: Secondary | ICD-10-CM | POA: Insufficient documentation

## 2013-03-18 DIAGNOSIS — IMO0001 Reserved for inherently not codable concepts without codable children: Secondary | ICD-10-CM | POA: Insufficient documentation

## 2013-03-18 DIAGNOSIS — M25669 Stiffness of unspecified knee, not elsewhere classified: Secondary | ICD-10-CM | POA: Insufficient documentation

## 2013-03-18 DIAGNOSIS — R5381 Other malaise: Secondary | ICD-10-CM | POA: Insufficient documentation

## 2013-03-20 ENCOUNTER — Ambulatory Visit: Payer: BC Managed Care – PPO | Admitting: Physical Therapy

## 2013-03-22 ENCOUNTER — Ambulatory Visit: Payer: BC Managed Care – PPO | Admitting: Physical Therapy

## 2013-03-25 ENCOUNTER — Ambulatory Visit: Payer: BC Managed Care – PPO | Admitting: Physical Therapy

## 2013-03-27 ENCOUNTER — Ambulatory Visit: Payer: BC Managed Care – PPO | Admitting: Physical Therapy

## 2013-03-29 ENCOUNTER — Ambulatory Visit: Payer: BC Managed Care – PPO | Admitting: *Deleted

## 2013-04-01 ENCOUNTER — Ambulatory Visit: Payer: BC Managed Care – PPO | Admitting: Physical Therapy

## 2013-04-08 ENCOUNTER — Ambulatory Visit: Payer: BC Managed Care – PPO | Admitting: Physical Therapy

## 2013-04-10 ENCOUNTER — Ambulatory Visit: Payer: BC Managed Care – PPO | Admitting: Physical Therapy

## 2013-04-15 ENCOUNTER — Ambulatory Visit: Payer: BC Managed Care – PPO | Attending: Orthopedic Surgery | Admitting: Physical Therapy

## 2013-04-15 DIAGNOSIS — IMO0001 Reserved for inherently not codable concepts without codable children: Secondary | ICD-10-CM | POA: Insufficient documentation

## 2013-04-15 DIAGNOSIS — R5381 Other malaise: Secondary | ICD-10-CM | POA: Insufficient documentation

## 2013-04-15 DIAGNOSIS — M25669 Stiffness of unspecified knee, not elsewhere classified: Secondary | ICD-10-CM | POA: Insufficient documentation

## 2013-04-15 DIAGNOSIS — M25569 Pain in unspecified knee: Secondary | ICD-10-CM | POA: Insufficient documentation

## 2013-04-15 DIAGNOSIS — R262 Difficulty in walking, not elsewhere classified: Secondary | ICD-10-CM | POA: Insufficient documentation

## 2013-04-17 ENCOUNTER — Ambulatory Visit: Payer: BC Managed Care – PPO | Admitting: Physical Therapy

## 2013-04-19 ENCOUNTER — Ambulatory Visit: Payer: BC Managed Care – PPO | Admitting: Physical Therapy

## 2013-04-22 ENCOUNTER — Ambulatory Visit: Payer: BC Managed Care – PPO | Admitting: Physical Therapy

## 2013-04-24 ENCOUNTER — Ambulatory Visit: Payer: BC Managed Care – PPO | Admitting: Physical Therapy

## 2013-04-26 ENCOUNTER — Ambulatory Visit: Payer: BC Managed Care – PPO | Admitting: *Deleted

## 2013-04-29 ENCOUNTER — Ambulatory Visit: Payer: BC Managed Care – PPO | Admitting: Physical Therapy

## 2013-05-01 ENCOUNTER — Ambulatory Visit: Payer: BC Managed Care – PPO | Admitting: Physical Therapy

## 2013-05-03 ENCOUNTER — Ambulatory Visit: Payer: BC Managed Care – PPO | Admitting: *Deleted

## 2013-05-06 ENCOUNTER — Ambulatory Visit: Payer: BC Managed Care – PPO | Admitting: Physical Therapy

## 2013-05-08 ENCOUNTER — Ambulatory Visit: Payer: BC Managed Care – PPO | Admitting: Physical Therapy

## 2013-05-10 ENCOUNTER — Ambulatory Visit: Payer: BC Managed Care – PPO | Admitting: *Deleted

## 2013-05-13 ENCOUNTER — Ambulatory Visit: Payer: BC Managed Care – PPO | Attending: Orthopedic Surgery | Admitting: Physical Therapy

## 2013-05-13 DIAGNOSIS — IMO0001 Reserved for inherently not codable concepts without codable children: Secondary | ICD-10-CM | POA: Insufficient documentation

## 2013-05-13 DIAGNOSIS — R5381 Other malaise: Secondary | ICD-10-CM | POA: Insufficient documentation

## 2013-05-13 DIAGNOSIS — M25569 Pain in unspecified knee: Secondary | ICD-10-CM | POA: Insufficient documentation

## 2013-05-13 DIAGNOSIS — M25669 Stiffness of unspecified knee, not elsewhere classified: Secondary | ICD-10-CM | POA: Insufficient documentation

## 2013-05-13 DIAGNOSIS — R262 Difficulty in walking, not elsewhere classified: Secondary | ICD-10-CM | POA: Insufficient documentation

## 2013-05-15 ENCOUNTER — Ambulatory Visit: Payer: BC Managed Care – PPO | Admitting: Physical Therapy

## 2013-05-17 ENCOUNTER — Ambulatory Visit: Payer: BC Managed Care – PPO | Admitting: Physical Therapy

## 2013-05-20 ENCOUNTER — Ambulatory Visit: Payer: BC Managed Care – PPO | Admitting: Physical Therapy

## 2013-05-22 ENCOUNTER — Ambulatory Visit: Payer: BC Managed Care – PPO | Admitting: Physical Therapy

## 2013-05-24 ENCOUNTER — Encounter: Payer: BC Managed Care – PPO | Admitting: Physical Therapy

## 2013-05-27 ENCOUNTER — Ambulatory Visit: Payer: BC Managed Care – PPO | Admitting: Physical Therapy

## 2013-05-29 ENCOUNTER — Ambulatory Visit: Payer: BC Managed Care – PPO | Admitting: Physical Therapy

## 2013-05-31 ENCOUNTER — Ambulatory Visit: Payer: BC Managed Care – PPO | Admitting: Physical Therapy

## 2013-06-03 ENCOUNTER — Ambulatory Visit: Payer: BC Managed Care – PPO | Admitting: Physical Therapy

## 2013-06-05 ENCOUNTER — Ambulatory Visit: Payer: BC Managed Care – PPO | Admitting: Physical Therapy

## 2013-06-07 ENCOUNTER — Ambulatory Visit: Payer: BC Managed Care – PPO | Admitting: Physical Therapy

## 2013-06-10 ENCOUNTER — Ambulatory Visit: Payer: BC Managed Care – PPO | Attending: Orthopedic Surgery | Admitting: Physical Therapy

## 2013-06-10 DIAGNOSIS — M25669 Stiffness of unspecified knee, not elsewhere classified: Secondary | ICD-10-CM | POA: Insufficient documentation

## 2013-06-10 DIAGNOSIS — IMO0001 Reserved for inherently not codable concepts without codable children: Secondary | ICD-10-CM | POA: Insufficient documentation

## 2013-06-10 DIAGNOSIS — R262 Difficulty in walking, not elsewhere classified: Secondary | ICD-10-CM | POA: Insufficient documentation

## 2013-06-10 DIAGNOSIS — M25569 Pain in unspecified knee: Secondary | ICD-10-CM | POA: Insufficient documentation

## 2013-06-10 DIAGNOSIS — R5381 Other malaise: Secondary | ICD-10-CM | POA: Insufficient documentation

## 2013-06-12 ENCOUNTER — Ambulatory Visit: Payer: BC Managed Care – PPO | Admitting: Physical Therapy

## 2013-06-14 ENCOUNTER — Ambulatory Visit: Payer: BC Managed Care – PPO | Admitting: *Deleted

## 2013-06-17 ENCOUNTER — Ambulatory Visit: Payer: BC Managed Care – PPO | Admitting: Physical Therapy

## 2013-06-19 ENCOUNTER — Ambulatory Visit: Payer: BC Managed Care – PPO | Admitting: Physical Therapy

## 2013-06-21 ENCOUNTER — Ambulatory Visit: Payer: BC Managed Care – PPO | Admitting: *Deleted

## 2014-03-14 ENCOUNTER — Other Ambulatory Visit: Payer: Self-pay | Admitting: Gastroenterology

## 2014-03-14 NOTE — Telephone Encounter (Signed)
Needs OV. RF X 2 only. Has not been seen within past two years.

## 2014-08-13 ENCOUNTER — Encounter: Payer: Self-pay | Admitting: Internal Medicine

## 2014-09-09 ENCOUNTER — Encounter (INDEPENDENT_AMBULATORY_CARE_PROVIDER_SITE_OTHER): Payer: Self-pay

## 2014-09-09 ENCOUNTER — Encounter: Payer: Self-pay | Admitting: Gastroenterology

## 2014-09-09 ENCOUNTER — Ambulatory Visit (INDEPENDENT_AMBULATORY_CARE_PROVIDER_SITE_OTHER): Payer: BLUE CROSS/BLUE SHIELD | Admitting: Gastroenterology

## 2014-09-09 ENCOUNTER — Other Ambulatory Visit: Payer: Self-pay

## 2014-09-09 VITALS — BP 168/86 | HR 86 | Temp 97.4°F | Ht 72.0 in | Wt 322.4 lb

## 2014-09-09 DIAGNOSIS — K5010 Crohn's disease of large intestine without complications: Secondary | ICD-10-CM | POA: Diagnosis not present

## 2014-09-09 DIAGNOSIS — K50119 Crohn's disease of large intestine with unspecified complications: Secondary | ICD-10-CM

## 2014-09-09 LAB — COMPREHENSIVE METABOLIC PANEL
ALT: 25 U/L (ref 0–53)
AST: 15 U/L (ref 0–37)
Albumin: 4.2 g/dL (ref 3.5–5.2)
Alkaline Phosphatase: 55 U/L (ref 39–117)
BUN: 11 mg/dL (ref 6–23)
CHLORIDE: 102 meq/L (ref 96–112)
CO2: 28 meq/L (ref 19–32)
CREATININE: 0.79 mg/dL (ref 0.50–1.35)
Calcium: 8.8 mg/dL (ref 8.4–10.5)
Glucose, Bld: 103 mg/dL — ABNORMAL HIGH (ref 70–99)
Potassium: 4.2 mEq/L (ref 3.5–5.3)
Sodium: 140 mEq/L (ref 135–145)
TOTAL PROTEIN: 6.6 g/dL (ref 6.0–8.3)
Total Bilirubin: 0.4 mg/dL (ref 0.2–1.2)

## 2014-09-09 MED ORDER — PEG 3350-KCL-NA BICARB-NACL 420 G PO SOLR: 4000.0000 mL | Freq: Once | ORAL | Status: DC

## 2014-09-09 MED ORDER — MESALAMINE 1.2 G PO TBEC: DELAYED_RELEASE_TABLET | ORAL | Status: DC

## 2014-09-09 NOTE — Patient Instructions (Signed)
1. Colonoscopy as scheduled. See separate instructions.  2. Return to the office in one year or sooner if needed.

## 2014-09-09 NOTE — Progress Notes (Signed)
Primary Care Physician: Octavio Graves, DO  Primary Gastroenterologist:  Garfield Cornea, MD   Chief Complaint  Patient presents with  . Medication Refill    HPI: Ricky Blackwell is a 54 y.o. male here for follow-up of Crohn's colitis. Last seen in July 2013. Last colonoscopy 2010, normal distal rectum, proximal rectum, scattered 1-2 mm aphthous ulcers, these extended into the distal sigmoid, few at ICV too.Since he was last here he had a knee replacement (01/2014). He had a few months of heartburn after this, treated with TUMS, subsequently went away. Feels good. His appetite is good. Minimal abdominal pain. Bowel movement 4 times per day. No blood in stool. No melena. No abnormal weight loss.  Diagnosed with Crohn's back in 1993.   Current Outpatient Prescriptions  Medication Sig Dispense Refill  . LIALDA 1.2 G EC tablet TAKE 4 (FOUR) TABLETS EVERY MORNING 120 tablet 1  . oxyCODONE (ROXICODONE) 5 MG immediate release tablet 1-2 tabs po q4-6hrs prn pain 120 tablet 0   No current facility-administered medications for this visit.    Allergies as of 09/09/2014  . (No Known Allergies)   Past Medical History  Diagnosis Date  . Irritable bowel syndrome   . Colitis   . Rectal bleeding   . Unspecified constipation   . Anal stenosis   . Crohn's disease   . Pneumonia     hx  . History of kidney stones   . H/O hiatal hernia   . Arthritis    Past Surgical History  Procedure Laterality Date  . Back surgery    . Neck surgery    . Abdominal hernia repair  2010  . Knee surgery  2010    Left Arthoscopic  . Colonoscopy  05/2004    Dr. Amedeo Plenty- [facility]- crohns   . Colonoscopy  08/20/2008    Dr. Gala Romney- normal distal rectum, proximal rectum, scattered 1-2 mm aphthous ulcers, these extended into the distal sigmoid.  . Total knee arthroplasty Left 02/22/2013    Procedure: LEFT TOTAL KNEE ARTHROPLASTY;  Surgeon: Yvette Rack., MD;  Location: [facility];  Service: Orthopedics;   Laterality: Left;   Family History  Problem Relation Age of Onset  . Diabetes Mother   . Heart failure Mother   . Heart failure Father    History   Social History  . Marital Status: Married    Spouse Name: N/A  . Number of Children: N/A  . Years of Education: N/A   Social History Main Topics  . Smoking status: Former Smoker -- 2.00 packs/day for 10 years    Types: Cigarettes    Quit date: 02/13/1987  . Smokeless tobacco: Not on file  . Alcohol Use: No  . Drug Use: No  . Sexual Activity: Not on file   Other Topics Concern  . None   Social History Narrative      ROS:  General: Negative for anorexia, weight loss, fever, chills, fatigue, weakness. Eyes: Negative for vision changes.  ENT: Negative for hoarseness, difficulty swallowing , nasal congestion. CV: Negative for chest pain, angina, palpitations, dyspnea on exertion, peripheral edema.  Respiratory: Negative for dyspnea at rest, dyspnea on exertion, cough, sputum, wheezing.  GI: See history of present illness. GU:  Negative for dysuria, hematuria, urinary incontinence, urinary frequency, nocturnal urination.  MS: Negative for joint pain, low back pain.  Derm: Negative for rash or itching.  Neuro: Negative for weakness, abnormal sensation, seizure, frequent headaches, memory loss, confusion.  Psych: Negative for anxiety, depression, suicidal ideation, hallucinations.  Endo: Negative for unusual weight change.  Heme: Negative for bruising or bleeding. Allergy: Negative for rash or hives.   Physical Examination:   BP 168/86 mmHg  Pulse 86  Temp(Src) 97.4 F (36.3 C) (Oral)  Ht 6' (1.829 m)  Wt 322 lb 6.4 oz (146.24 kg)  BMI 43.72 kg/m2  Physical Examination:   General: Well-nourished, well-developed in no acute distress.  Head: Normocephalic, atraumatic.   Eyes: Conjunctiva pink, no icterus. Mouth: Oropharyngeal mucosa moist and pink , no lesions erythema or exudate. Neck: Supple without thyromegaly,  masses, or lymphadenopathy.  Lungs: Clear to auscultation bilaterally.  Heart: Regular rate and rhythm, no murmurs rubs or gallops.  Abdomen: Bowel sounds are normal, nontender, nondistended, no hepatosplenomegaly or masses, no abdominal bruits or    hernia , no rebound or guarding.   Extremities: No lower extremity edema.  Neuro: Alert and oriented x 4 , grossly normal neurologically.  Skin: Warm and dry, no rash or jaundice.   Psych: Alert and cooperative, normal mood and affect.    Labs:  None available  Imaging Studies: No results found.

## 2014-09-10 LAB — CBC WITH DIFFERENTIAL/PLATELET
BASOS ABS: 0 10*3/uL (ref 0.0–0.1)
Basophils Relative: 0 % (ref 0–1)
EOS ABS: 0.1 10*3/uL (ref 0.0–0.7)
Eosinophils Relative: 2 % (ref 0–5)
HEMATOCRIT: 44.5 % (ref 39.0–52.0)
Hemoglobin: 15.2 g/dL (ref 13.0–17.0)
LYMPHS ABS: 1.9 10*3/uL (ref 0.7–4.0)
Lymphocytes Relative: 28 % (ref 12–46)
MCH: 30.8 pg (ref 26.0–34.0)
MCHC: 34.2 g/dL (ref 30.0–36.0)
MCV: 90.3 fL (ref 78.0–100.0)
MONO ABS: 0.7 10*3/uL (ref 0.1–1.0)
MPV: 9.1 fL (ref 8.6–12.4)
Monocytes Relative: 10 % (ref 3–12)
NEUTROS PCT: 60 % (ref 43–77)
Neutro Abs: 4 10*3/uL (ref 1.7–7.7)
Platelets: 244 10*3/uL (ref 150–400)
RBC: 4.93 MIL/uL (ref 4.22–5.81)
RDW: 14.1 % (ref 11.5–15.5)
WBC: 6.7 10*3/uL (ref 4.0–10.5)

## 2014-09-12 ENCOUNTER — Encounter: Payer: Self-pay | Admitting: Gastroenterology

## 2014-09-12 NOTE — Assessment & Plan Note (Addendum)
History of Crohn's colitis diagnosed in 1993. Last colonoscopy 2010. He is overdue for surveillance colonoscopy. Likely is doing well. Continue the Lialda 4.8 g daily.  I have discussed the risks, alternatives, benefits with regards to but not limited to the risk of reaction to medication, bleeding, infection, perforation and the patient is agreeable to proceed. Written consent to be obtained.

## 2014-09-14 NOTE — Progress Notes (Signed)
Quick Note:  Please let patient know labs are good. Colonoscopy as planned. ______

## 2014-09-15 NOTE — Progress Notes (Signed)
Quick Note:  LMOM the message and call if questions. ______

## 2014-09-24 NOTE — Progress Notes (Signed)
cc'd to pcp 

## 2014-10-03 ENCOUNTER — Ambulatory Visit (HOSPITAL_COMMUNITY)
Admission: RE | Admit: 2014-10-03 | Discharge: 2014-10-03 | Disposition: A | Discharge status: Home / Self Care | Payer: BLUE CROSS/BLUE SHIELD | Source: Ambulatory Visit | Attending: Internal Medicine | Admitting: Internal Medicine

## 2014-10-03 ENCOUNTER — Encounter (HOSPITAL_COMMUNITY)
Admission: RE | Disposition: A | Discharge status: Home / Self Care | Payer: Self-pay | Source: Ambulatory Visit | Attending: Internal Medicine

## 2014-10-03 ENCOUNTER — Encounter (HOSPITAL_COMMUNITY): Payer: Self-pay | Admitting: *Deleted

## 2014-10-03 DIAGNOSIS — K589 Irritable bowel syndrome without diarrhea: Secondary | ICD-10-CM | POA: Insufficient documentation

## 2014-10-03 DIAGNOSIS — K50119 Crohn's disease of large intestine with unspecified complications: Secondary | ICD-10-CM

## 2014-10-03 DIAGNOSIS — K639 Disease of intestine, unspecified: Secondary | ICD-10-CM | POA: Insufficient documentation

## 2014-10-03 DIAGNOSIS — Z87442 Personal history of urinary calculi: Secondary | ICD-10-CM | POA: Insufficient documentation

## 2014-10-03 DIAGNOSIS — K5010 Crohn's disease of large intestine without complications: Secondary | ICD-10-CM | POA: Insufficient documentation

## 2014-10-03 DIAGNOSIS — D225 Melanocytic nevi of trunk: Secondary | ICD-10-CM | POA: Insufficient documentation

## 2014-10-03 DIAGNOSIS — K5730 Diverticulosis of large intestine without perforation or abscess without bleeding: Secondary | ICD-10-CM | POA: Diagnosis not present

## 2014-10-03 DIAGNOSIS — Z87891 Personal history of nicotine dependence: Secondary | ICD-10-CM | POA: Insufficient documentation

## 2014-10-03 DIAGNOSIS — Z1211 Encounter for screening for malignant neoplasm of colon: Secondary | ICD-10-CM | POA: Diagnosis present

## 2014-10-03 DIAGNOSIS — Z96652 Presence of left artificial knee joint: Secondary | ICD-10-CM | POA: Diagnosis not present

## 2014-10-03 DIAGNOSIS — M1990 Unspecified osteoarthritis, unspecified site: Secondary | ICD-10-CM | POA: Insufficient documentation

## 2014-10-03 HISTORY — PX: COLONOSCOPY: SHX5424

## 2014-10-03 SURGERY — COLONOSCOPY
Anesthesia: Moderate Sedation

## 2014-10-03 MED ORDER — PROMETHAZINE HCL 25 MG/ML IJ SOLN
25.0000 mg | Freq: Once | INTRAMUSCULAR | Status: AC
  Administered 2014-10-03: 25 mg via INTRAVENOUS
  Filled 2014-10-03: qty 1

## 2014-10-03 MED ORDER — MIDAZOLAM HCL 5 MG/5ML IJ SOLN
INTRAMUSCULAR | Status: DC | PRN
  Administered 2014-10-03: 1 mg via INTRAVENOUS
  Administered 2014-10-03: 2 mg via INTRAVENOUS
  Administered 2014-10-03: 1 mg via INTRAVENOUS
  Administered 2014-10-03: 2 mg via INTRAVENOUS

## 2014-10-03 MED ORDER — MEPERIDINE HCL 100 MG/ML IJ SOLN
INTRAMUSCULAR | Status: AC
  Filled 2014-10-03: qty 2

## 2014-10-03 MED ORDER — ONDANSETRON HCL 4 MG/2ML IJ SOLN
INTRAMUSCULAR | Status: AC
  Filled 2014-10-03: qty 2

## 2014-10-03 MED ORDER — ONDANSETRON HCL 4 MG/2ML IJ SOLN
INTRAMUSCULAR | Status: DC | PRN
  Administered 2014-10-03: 4 mg via INTRAVENOUS

## 2014-10-03 MED ORDER — SODIUM CHLORIDE 0.9 % IJ SOLN
INTRAMUSCULAR | Status: AC
  Filled 2014-10-03: qty 3

## 2014-10-03 MED ORDER — MIDAZOLAM HCL 5 MG/5ML IJ SOLN
INTRAMUSCULAR | Status: AC
  Filled 2014-10-03: qty 10

## 2014-10-03 MED ORDER — MEPERIDINE HCL 100 MG/ML IJ SOLN
INTRAMUSCULAR | Status: DC | PRN
  Administered 2014-10-03 (×2): 50 mg via INTRAVENOUS

## 2014-10-03 MED ORDER — SODIUM CHLORIDE 0.9 % IV SOLN
INTRAVENOUS | Status: DC
  Administered 2014-10-03: 1000 mL via INTRAVENOUS

## 2014-10-03 NOTE — H&P (View-Only) (Signed)
Primary Care Physician: Octavio Graves, DO  Primary Gastroenterologist:  Garfield Cornea, MD   Chief Complaint  Patient presents with  . Medication Refill    HPI: Ricky Blackwell is a 54 y.o. male here for follow-up of Crohn's colitis. Last seen in July 2013. Last colonoscopy 2010, normal distal rectum, proximal rectum, scattered 1-2 mm aphthous ulcers, these extended into the distal sigmoid, few at ICV too.Since he was last here he had a knee replacement (01/2014). He had a few months of heartburn after this, treated with TUMS, subsequently went away. Feels good. His appetite is good. Minimal abdominal pain. Bowel movement 4 times per day. No blood in stool. No melena. No abnormal weight loss.  Diagnosed with Crohn's back in 1993.   Current Outpatient Prescriptions  Medication Sig Dispense Refill  . LIALDA 1.2 G EC tablet TAKE 4 (FOUR) TABLETS EVERY MORNING 120 tablet 1  . oxyCODONE (ROXICODONE) 5 MG immediate release tablet 1-2 tabs po q4-6hrs prn pain 120 tablet 0   No current facility-administered medications for this visit.    Allergies as of 09/09/2014  . (No Known Allergies)   Past Medical History  Diagnosis Date  . Irritable bowel syndrome   . Colitis   . Rectal bleeding   . Unspecified constipation   . Anal stenosis   . Crohn's disease   . Pneumonia     hx  . History of kidney stones   . H/O hiatal hernia   . Arthritis    Past Surgical History  Procedure Laterality Date  . Back surgery    . Neck surgery    . Abdominal hernia repair  2010  . Knee surgery  2010    Left Arthoscopic  . Colonoscopy  05/2004    Dr. Amedeo Plenty- [facility]- crohns   . Colonoscopy  08/20/2008    Dr. Gala Romney- normal distal rectum, proximal rectum, scattered 1-2 mm aphthous ulcers, these extended into the distal sigmoid.  . Total knee arthroplasty Left 02/22/2013    Procedure: LEFT TOTAL KNEE ARTHROPLASTY;  Surgeon: Yvette Rack., MD;  Location: [facility];  Service: Orthopedics;   Laterality: Left;   Family History  Problem Relation Age of Onset  . Diabetes Mother   . Heart failure Mother   . Heart failure Father    History   Social History  . Marital Status: Married    Spouse Name: N/A  . Number of Children: N/A  . Years of Education: N/A   Social History Main Topics  . Smoking status: Former Smoker -- 2.00 packs/day for 10 years    Types: Cigarettes    Quit date: 02/13/1987  . Smokeless tobacco: Not on file  . Alcohol Use: No  . Drug Use: No  . Sexual Activity: Not on file   Other Topics Concern  . None   Social History Narrative      ROS:  General: Negative for anorexia, weight loss, fever, chills, fatigue, weakness. Eyes: Negative for vision changes.  ENT: Negative for hoarseness, difficulty swallowing , nasal congestion. CV: Negative for chest pain, angina, palpitations, dyspnea on exertion, peripheral edema.  Respiratory: Negative for dyspnea at rest, dyspnea on exertion, cough, sputum, wheezing.  GI: See history of present illness. GU:  Negative for dysuria, hematuria, urinary incontinence, urinary frequency, nocturnal urination.  MS: Negative for joint pain, low back pain.  Derm: Negative for rash or itching.  Neuro: Negative for weakness, abnormal sensation, seizure, frequent headaches, memory loss, confusion.  Psych: Negative for anxiety, depression, suicidal ideation, hallucinations.  Endo: Negative for unusual weight change.  Heme: Negative for bruising or bleeding. Allergy: Negative for rash or hives.   Physical Examination:   BP 168/86 mmHg  Pulse 86  Temp(Src) 97.4 F (36.3 C) (Oral)  Ht 6' (1.829 m)  Wt 322 lb 6.4 oz (146.24 kg)  BMI 43.72 kg/m2  Physical Examination:   General: Well-nourished, well-developed in no acute distress.  Head: Normocephalic, atraumatic.   Eyes: Conjunctiva pink, no icterus. Mouth: Oropharyngeal mucosa moist and pink , no lesions erythema or exudate. Neck: Supple without thyromegaly,  masses, or lymphadenopathy.  Lungs: Clear to auscultation bilaterally.  Heart: Regular rate and rhythm, no murmurs rubs or gallops.  Abdomen: Bowel sounds are normal, nontender, nondistended, no hepatosplenomegaly or masses, no abdominal bruits or    hernia , no rebound or guarding.   Extremities: No lower extremity edema.  Neuro: Alert and oriented x 4 , grossly normal neurologically.  Skin: Warm and dry, no rash or jaundice.   Psych: Alert and cooperative, normal mood and affect.    Labs:  None available  Imaging Studies: No results found.

## 2014-10-03 NOTE — Op Note (Signed)
[facility] [address] [place], [address]   COLONOSCOPY PROCEDURE REPORT  PATIENT: Ricky Blackwell, Ricky Blackwell  MR#: [id number] BIRTHDATE: [birth year] , 7  yrs. old GENDER: male ENDOSCOPIST: R.  Garfield Cornea, MD FACP [facility] REFERRED HQ:[id number] PROCEDURE DATE:  10-07-2014 PROCEDURE:   Ileo-colonoscopy with biopsy INDICATIONS:Surveillance examination; long-standing Crohn's colitis?"in remission on Lialda. MEDICATIONS: Versed 6 mg IV and Demerol 100 mg IV in divided doses. Zofran 4 mg IV. ASA CLASS:       Class II  CONSENT: The risks, benefits, alternatives and imponderables including but not limited to bleeding, perforation as well as the possibility of a missed lesion have been reviewed.  The potential for biopsy, lesion removal, etc. have also been discussed. Questions have been answered.  All parties agreeable.  Please see the history and physical in the medical record for more information.  DESCRIPTION OF PROCEDURE:   After the risks benefits and alternatives of the procedure were thoroughly explained, informed consent was obtained.  The digital rectal exam      The EC-[id number] ([id number])  endoscope was introduced through the anus and advanced to the   . No adverse events experienced.   The quality of the prep was     The instrument was then slowly withdrawn as the colon was fully examined. Estimated blood loss is zero unless otherwise noted in this procedure report.      COLON FINDINGS: 7 mm nevus appearing lesion on the right buttock which has been bothering patient noted.  Normal-appearing rectal mucosa.  Scattered aphthous appearing ulcers in the's distal sigmoid; more pronounced in the proximal sigmoid; a few scattered aphthous ulcers in the descending segment;the transverse segment appeared normal.  There were scattered 1-2 mm aphthous ulcers in the ascending segment extending into the distal 10 cm of terminal ileum.  The patient had left-sided  diverticula; the remainder of the colonic mucosa appeared normal.  Sigmoid biopsies of the terminal ileum, ascending, transverse, descending, sigmoid and normal-appearing rectal segments taken.  Retroflexion was performed. .  Withdrawal time=15 minutes 0 seconds.  The scope was withdrawn and the procedure completed. COMPLICATIONS: There were no immediate complications.  ENDOSCOPIC IMPRESSION: Inflammatory changes involving the terminal ileum and patchy inflammation of the colon consistent with Crohn's ileocolitis - ?"status post segmental biopsy.  RECOMMENDATIONS: Continue Lialda daily as this off label treatment seems to be maintaining a clinical remission nicely. Follow up on pathology.  eSigned:  R. Garfield Cornea, MD Rosalita Chessman Marval Regal 10/07/14 2:28 PM   cc:  CPT CODES: ICD CODES:  The ICD and CPT codes recommended by this software are interpretations from the data that the clinical staff has captured with the software.  The verification of the translation of this report to the ICD and CPT codes and modifiers is the sole responsibility of the health care institution and practicing physician where this report was generated.  [employer]. will not be held responsible for the validity of the ICD and CPT codes included on this report.  AMA assumes no liability for data contained or not contained herein. CPT is a Designer, television/film set of the [employer].  PATIENT NAME:  Ricky Blackwell, Ricky Blackwell MR#: [id number]

## 2014-10-03 NOTE — Discharge Instructions (Signed)
Colonoscopy Discharge Instructions  Read the instructions outlined below and refer to this sheet in the next few weeks. These discharge instructions provide you with general information on caring for yourself after you leave the hospital. Your doctor may also give you specific instructions. While your treatment has been planned according to the most current medical practices available, unavoidable complications occasionally occur. If you have any problems or questions after discharge, call Dr. Gala Romney at [phone]. ACTIVITY  You may resume your regular activity, but move at a slower pace for the next 24 hours.   Take frequent rest periods for the next 24 hours.   Walking will help get rid of the air and reduce the bloated feeling in your belly (abdomen).   No driving for 24 hours (because of the medicine (anesthesia) used during the test).    Do not sign any important legal documents or operate any machinery for 24 hours (because of the anesthesia used during the test).  NUTRITION  Drink plenty of fluids.   You may resume your normal diet as instructed by your doctor.   Begin with a light meal and progress to your normal diet. Heavy or fried foods are harder to digest and may make you feel sick to your stomach (nauseated).   Avoid alcoholic beverages for 24 hours or as instructed.  MEDICATIONS  You may resume your normal medications unless your doctor tells you otherwise.  WHAT YOU CAN EXPECT TODAY  Some feelings of bloating in the abdomen.   Passage of more gas than usual.   Spotting of blood in your stool or on the toilet paper.  IF YOU HAD POLYPS REMOVED DURING THE COLONOSCOPY:  No aspirin products for 7 days or as instructed.   No alcohol for 7 days or as instructed.   Eat a soft diet for the next 24 hours.  FINDING OUT THE RESULTS OF YOUR TEST Not all test results are available during your visit. If your test results are not back during the visit, make an appointment  with your caregiver to find out the results. Do not assume everything is normal if you have not heard from your caregiver or the medical facility. It is important for you to follow up on all of your test results.  SEEK IMMEDIATE MEDICAL ATTENTION IF:  You have more than a spotting of blood in your stool.   Your belly is swollen (abdominal distention).   You are nauseated or vomiting.   You have a temperature over 101.   You have abdominal pain or discomfort that is severe or gets worse throughout the day.   Diverticulosis in Crohn's disease information provided  Continue Lialda     Further recommendations to follow pending review of pathology report See dermatologist regarding lesion noted on right buttock.      Diverticulosis Diverticulosis is the condition that develops when small pouches (diverticula) form in the wall of your colon. Your colon, or large intestine, is where water is absorbed and stool is formed. The pouches form when the inside layer of your colon pushes through weak spots in the outer layers of your colon. CAUSES  No one knows exactly what causes diverticulosis. RISK FACTORS  Being older than 20. Your risk for this condition increases with age. Diverticulosis is rare in people younger than 40 years. By age 46, almost everyone has it.  Eating a low-fiber diet.  Being frequently constipated.  Being overweight.  Not getting enough exercise.  Smoking.  Taking over-the-counter pain  medicines, like aspirin and ibuprofen. SYMPTOMS  Most people with diverticulosis do not have symptoms. DIAGNOSIS  Because diverticulosis often has no symptoms, health care providers often discover the condition during an exam for other colon problems. In many cases, a health care provider will diagnose diverticulosis while using a flexible scope to examine the colon (colonoscopy). TREATMENT  If you have never developed an infection related to diverticulosis, you may not need  treatment. If you have had an infection before, treatment may include:  Eating more fruits, vegetables, and grains.  Taking a fiber supplement.  Taking a live bacteria supplement (probiotic).  Taking medicine to relax your colon. HOME CARE INSTRUCTIONS   Drink at least 6-8 glasses of water each day to prevent constipation.  Try not to strain when you have a bowel movement.  Keep all follow-up appointments. If you have had an infection before:  Increase the fiber in your diet as directed by your health care provider or dietitian.  Take a dietary fiber supplement if your health care provider approves.  Only take medicines as directed by your health care provider. SEEK MEDICAL CARE IF:   You have abdominal pain.  You have bloating.  You have cramps.  You have not gone to the bathroom in 3 days. SEEK IMMEDIATE MEDICAL CARE IF:   Your pain gets worse.  Yourbloating becomes very bad.  You have a fever or chills, and your symptoms suddenly get worse.  You begin vomiting.  You have bowel movements that are bloody or black. MAKE SURE YOU:  Understand these instructions.  Will watch your condition.  Will get help right away if you are not doing well or get worse. Document Released: 12/24/2003 Document Revised: 04/02/2013 Document Reviewed: 02/20/2013 [facility] Patient Information 2015 [place], [place]. This information is not intended to replace advice given to you by your health care provider. Make sure you discuss any questions you have with your health care provider.    Crohn Disease Crohn disease is a long-term (chronic) soreness and redness (inflammation) of the intestines (bowel). It can affect any portion of the digestive tract, from the mouth to the anus. It can also cause problems outside the digestive tract. Crohn disease is closely related to a disease called ulcerative colitis (together, these two diseases are called inflammatory bowel disease).  CAUSES    The cause of Crohn disease is not known. One Link Snuffer is that, in an easily affected person, the immune system is triggered to attack the body's own digestive tissue. Crohn disease runs in families. It seems to be more common in certain geographic areas and amongst certain races. There are no clear-cut dietary causes.  SYMPTOMS  Crohn disease can cause many different symptoms since it can affect many different parts of the body. Symptoms include: Fatigue. Weight loss. Chronic diarrhea, sometime bloody. Abdominal pain and cramps. Fever. Ulcers or canker sores in the mouth or rectum. Anemia (low red blood cells). Arthritis, skin problems, and eye problems may occur. Complications of Crohn disease can include: Series of holes (perforation) of the bowel. Portions of the intestines sticking to each other (adhesions). Obstruction of the bowel. Fistula formation, typically in the rectal area but also in other areas. A fistula is an opening between the bowels and the outside, or between the bowels and another organ. A painful crack in the mucous membrane of the anus (rectal fissure). DIAGNOSIS  Your caregiver may suspect Crohn disease based on your symptoms and an exam. Blood tests may confirm that there  is a problem. You may be asked to submit a stool specimen for examination. X-rays and CT scans may be necessary. Ultimately, the diagnosis is usually made after a procedure that uses a flexible tube that is inserted via your mouth or your anus. This is done under sedation and is called either an upper endoscopy or colonoscopy. With these tests, the specialist can take tiny tissue samples and remove them from the inside of the bowel (biopsy). Examination of this biopsy tissue under a microscope can reveal Crohn disease as the cause of your symptoms. Due to the many different forms that Crohn disease can take, symptoms may be present for several years before a diagnosis is made. TREATMENT  Medications are  often used to decrease inflammation and control the immune system. These include medicines related to aspirin, steroid medications, and newer and stronger medications to slow down the immune system. Some medications may be used as suppositories or enemas. A number of other medications are used or have been studied. Your caregiver will make specific recommendations. HOME CARE INSTRUCTIONS  Symptoms such as diarrhea can be controlled with medications. Avoid foods that have a laxative effect such as fresh fruit, vegetables, and dairy products. During flare-ups, you can rest your bowel by refraining from solid foods. Drink clear liquids frequently during the day. (Electrolyte or rehydrating fluids are best. Your caregiver can help you with suggestions.) Drink often to prevent loss of body fluids (dehydration). When diarrhea has cleared, eat small meals and more frequently. Avoid food additives and stimulants such as caffeine (coffee, tea, or chocolate). Enzyme supplements may help if you develop intolerance to a sugar in dairy products (lactose). Ask your caregiver or dietitian about specific dietary instructions. Try to maintain a positive attitude. Learn relaxation techniques such as self-hypnosis, mental imaging, and muscle relaxation. If possible, avoid stresses which can aggravate your condition. Exercise regularly. Follow your diet. Always get plenty of rest. SEEK MEDICAL CARE IF:  Your symptoms fail to improve after a week or two of new treatment. You experience continued weight loss. You have ongoing cramps or loose bowels. You develop a new skin rash, skin sores, or eye problems. SEEK IMMEDIATE MEDICAL CARE IF:  You have worsening of your symptoms or develop new symptoms. You have a fever. You develop bloody diarrhea. You develop severe abdominal pain. MAKE SURE YOU:  Understand these instructions. Will watch your condition. Will get help right away if you are not doing well or get  worse. Document Released: 01/05/2005 Document Revised: 08/12/2013 Document Reviewed: 12/04/2006 [facility] Patient Information 2015 [place], [place]. This information is not intended to replace advice given to you by your health care provider. Make sure you discuss any questions you have with your health care provider.

## 2014-10-03 NOTE — Interval H&P Note (Signed)
History and Physical Interval Note:  10/03/2014 1:35 PM  Ricky Blackwell  has presented today for surgery, with the diagnosis of crohns colitis  The various methods of treatment have been discussed with the patient and family. After consideration of risks, benefits and other options for treatment, the patient has consented to  Procedure(s) with comments: COLONOSCOPY (N/A) - 1300 as a surgical intervention .  The patient's history has been reviewed, patient examined, no change in status, stable for surgery.  I have reviewed the patient's chart and labs.  Questions were answered to the patient's satisfaction.     Ricky Blackwell  No change. Surveillance colonoscopy per plan. The risks, benefits, limitations, alternatives and imponderables have been reviewed with the patient. Questions have been answered. All parties are agreeable.

## 2014-10-08 ENCOUNTER — Encounter (HOSPITAL_COMMUNITY): Payer: Self-pay | Admitting: Internal Medicine

## 2014-10-13 ENCOUNTER — Encounter: Payer: Self-pay | Admitting: Internal Medicine

## 2016-03-11 ENCOUNTER — Other Ambulatory Visit: Payer: Self-pay

## 2016-03-11 MED ORDER — MESALAMINE 1.2 G PO TBEC: DELAYED_RELEASE_TABLET | ORAL | 1 refills | Status: DC

## 2016-03-11 NOTE — Telephone Encounter (Signed)
Patient needs an office visit. Hasn't been seen in over a year. History of Crohn's. I refilled X 1.

## 2016-03-14 ENCOUNTER — Encounter: Payer: Self-pay | Admitting: Internal Medicine

## 2016-03-14 NOTE — Telephone Encounter (Signed)
Appointment made and letter sent

## 2016-03-29 ENCOUNTER — Ambulatory Visit: Payer: BLUE CROSS/BLUE SHIELD | Admitting: Gastroenterology

## 2016-04-06 ENCOUNTER — Ambulatory Visit (INDEPENDENT_AMBULATORY_CARE_PROVIDER_SITE_OTHER): Payer: BLUE CROSS/BLUE SHIELD | Admitting: Gastroenterology

## 2016-04-06 ENCOUNTER — Encounter: Payer: Self-pay | Admitting: Gastroenterology

## 2016-04-06 DIAGNOSIS — K5080 Crohn's disease of both small and large intestine without complications: Secondary | ICD-10-CM | POA: Diagnosis not present

## 2016-04-06 MED ORDER — MESALAMINE 1.2 G PO TBEC
DELAYED_RELEASE_TABLET | ORAL | 11 refills | Status: DC
Start: 2016-04-06 — End: 2020-01-20

## 2016-04-06 NOTE — Progress Notes (Signed)
cc'd to pcp 

## 2016-04-06 NOTE — Progress Notes (Signed)
      Primary Care Physician: Octavio Graves, DO  Primary Gastroenterologist:  Garfield Cornea, MD   Chief Complaint  Patient presents with  . Crohn's Disease    HPI: Ricky Blackwell is a 55 y.o. male here for follow-up of ileocolonic Crohn's disease. Originally diagnosed in 1993.  Last seen June 2016 at time of colonoscopy. Mildly active Crohn's disease with scattered aphthous ulcers noted. Advised to continue off-label Lialda, given ability to maintain clinical remission.Clinically continues to do well. He has 4-5 solid stools daily. No rectal bleeding, abdominal pain. Admits that he has forgotten to take his medication on a regular basis recently. May skip several days at a time. No upper GI symptoms.   Current Outpatient Prescriptions  Medication Sig Dispense Refill  . mesalamine (LIALDA) 1.2 g EC tablet TAKE 4 (FOUR) TABLETS EVERY MORNING 120 tablet 1  . oxyCODONE (ROXICODONE) 5 MG immediate release tablet 1-2 tabs po q4-6hrs prn pain 120 tablet 0  . triamcinolone cream (KENALOG) 0.5 % Apply 1 application topically daily.  0   No current facility-administered medications for this visit.     Allergies as of 04/06/2016  . (No Known Allergies)    ROS:  General: Negative for anorexia, weight loss, fever, chills, fatigue, weakness. ENT: Negative for hoarseness, difficulty swallowing , nasal congestion. CV: Negative for chest pain, angina, palpitations, dyspnea on exertion, peripheral edema.  Respiratory: Negative for dyspnea at rest, dyspnea on exertion, cough, sputum, wheezing.  GI: See history of present illness. GU:  Negative for dysuria, hematuria, urinary incontinence, urinary frequency, nocturnal urination.  Endo: Negative for unusual weight change.    Physical Examination:   BP (!) 169/91   Pulse 68   Temp 97.6 F (36.4 C) (Oral)   Ht 6' (1.829 m)   Wt (!) 310 lb 12.8 oz (141 kg)   BMI 42.15 kg/m   General: Well-nourished, well-developed in no acute distress.    Eyes: No icterus. Mouth: Oropharyngeal mucosa moist and pink , no lesions erythema or exudate. Lungs: Clear to auscultation bilaterally.  Heart: Regular rate and rhythm, no murmurs rubs or gallops.  Abdomen: Bowel sounds are normal, nontender, nondistended, no hepatosplenomegaly or masses, no abdominal bruits or hernia , no rebound or guarding.   Extremities: No lower extremity edema. No clubbing or deformities. Neuro: Alert and oriented x 4   Skin: Warm and dry, no jaundice.   Psych: Alert and cooperative, normal mood and affect.  Labs:  Requested Imaging Studies: No results found.

## 2016-04-06 NOTE — Patient Instructions (Addendum)
1. Take Lialda four tablets daily. 2. I will obtain copy of labs done at [facility] for review. If further labs needed, I will let you know. 3. Return to the office in one year.

## 2016-04-06 NOTE — Assessment & Plan Note (Signed)
Clinically doing well. Encouraged him to take his Lialda on a regular basis.Prescription provided for 1 year. Requested labs done today to [facility] ED for purpose of updating his creatinine and LFTs. We will let him know if he needs further labs. He will come back in one year for follow-up. Plan on her surveillance colonoscopy sometime between June 2018 in June 2019.

## 2016-04-07 NOTE — Progress Notes (Signed)
Please let patient know that I did receive labs from PCP dated October 2017. His BUN was 14, creatinine 0.86, total bilirubin 0.2, alkaline phosphatase 76, AST 16.8, ALT 30, albumin 4.4. White blood cell count 7.1, hemoglobin 14.9, platelets 251,000. No further labs needed at this time.

## 2016-04-07 NOTE — Progress Notes (Signed)
REVIEWED-NO ADDITIONAL RECOMMENDATIONS. 

## 2016-04-08 NOTE — Progress Notes (Signed)
Tried to call pt- NA-LMOM with information.

## 2016-04-19 ENCOUNTER — Encounter: Payer: Self-pay | Admitting: Internal Medicine

## 2016-07-14 ENCOUNTER — Encounter: Payer: Self-pay | Admitting: Internal Medicine

## 2017-04-19 ENCOUNTER — Ambulatory Visit: Payer: BLUE CROSS/BLUE SHIELD | Admitting: Gastroenterology

## 2017-04-19 ENCOUNTER — Encounter: Payer: Self-pay | Admitting: Gastroenterology

## 2017-04-19 VITALS — BP 147/81 | HR 81 | Temp 97.4°F | Ht 72.0 in | Wt 314.6 lb

## 2017-04-19 DIAGNOSIS — K5080 Crohn's disease of both small and large intestine without complications: Secondary | ICD-10-CM | POA: Diagnosis not present

## 2017-04-19 NOTE — Progress Notes (Signed)
Referring Provider: Octavio Graves, DO Primary Care Physician:  Octavio Graves, DO Primary GI: Dr. Gala Romney   Chief Complaint  Patient presents with  . Crohn's Disease    f/u. Doing okay    HPI:   Ricky Blackwell is a 57 y.o. male presenting today with a history of ileocolonic Crohn's disease, diagnosed in 2. Last colonoscopy in June 2016 with scattered aphthous ulcers in the distal sigmoid, more pronounced in proximal sigmoid, a few scattered ulcers in descending colon, transverse normal, scattered 1-2 mm ulcers in ascending segment extending into the distal 10 cm of TI, left-sided diverticula. Mild chronic active colitis and ileitis.   Will get constipated if taking a Percocet. Doesn't take often. Will take 4 stool softeners, which will help with constipation. No rectal bleeding. Rare abdominal discomfort. No upper GI symptoms.   Past Medical History:  Diagnosis Date  . Anal stenosis   . Arthritis   . Colitis   . Crohn's disease ([place])   . H/O hiatal hernia   . History of kidney stones   . Irritable bowel syndrome   . Pneumonia    hx  . Rectal bleeding   . Unspecified constipation     Past Surgical History:  Procedure Laterality Date  . ABDOMINAL HERNIA REPAIR  2010  . BACK SURGERY    . COLONOSCOPY  05/2004   Dr. Amedeo Plenty- [facility]- crohns   . COLONOSCOPY  08/20/2008   Dr. Gala Romney- normal distal rectum, proximal rectum, scattered 1-2 mm aphthous ulcers, these extended into the distal sigmoid.  Marland Kitchen COLONOSCOPY N/A 10/03/2014   [name]: findings c/w mildly active ileocolonic crohn's with scattered aphthous ulcers in the distal sigmoid colon, descending colon, ascending colon, terminal ileum. Transverse colon was normal.  . KNEE SURGERY  2010   Left Arthoscopic  . NECK SURGERY    . NECK SURGERY N/A 2005   metal plate in neck  . 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 cyclobenzaprine (FLEXERIL) 10 MG tablet as needed.  0  . lisinopril (PRINIVIL,ZESTRIL) 10 MG tablet daily.  0  . mesalamine (LIALDA) 1.2 g EC tablet TAKE 4 (FOUR) TABLETS by mouth every evening. 120 tablet 11  . oxyCODONE (ROXICODONE) 5 MG immediate release tablet 1-2 tabs po q4-6hrs prn pain 120 tablet 0  . triamcinolone cream (KENALOG) 0.5 % Apply 1 application topically daily.  0   No current facility-administered medications for this visit.     Allergies as of 04/19/2017  . (No Known Allergies)    Family History  Problem Relation Age of Onset  . Diabetes Mother   . Heart failure Mother   . Heart failure Father   . Colon polyps Father   . Cancer - Lung Father   . COPD Sister   . Diabetes type II Sister   . Hypertension Sister   . Sleep apnea Sister   . Arthritis Sister   . Irregular heart beat Brother   . Alcoholism Brother   . Arthritis Brother     Social History   Socioeconomic History  . Marital status: Married    Spouse name: Not on file  . Number of children: Not on file  . Years of education: Not on file  . Highest education level: Not on file  Social  Needs  . Financial resource strain: Not on file  . Food insecurity - worry: Not on file  . Food insecurity - inability: Not on file  . Transportation needs - medical: Not on file  . Transportation needs - non-medical: Not on file  Occupational History  . Not on file  Tobacco Use  . Smoking status: Former Smoker    Packs/day: 2.00    Years: 10.00    Pack years: 20.00    Types: Cigarettes    Last attempt to quit: 02/13/1987    Years since quitting: 30.2  . Smokeless tobacco: Never Used  Substance and Sexual Activity  . Alcohol use: No  . Drug use: No  . Sexual activity: Not on file  Other Topics Concern  . Not on file  Social History Narrative  . Not on file    Review of Systems: Gen: Denies  fever, chills, anorexia. Denies fatigue, weakness, weight loss.  CV: Denies chest pain, palpitations, syncope, peripheral edema, and claudication. Resp: Denies dyspnea at rest, cough, wheezing, coughing up blood, and pleurisy. GI: see HPI  Derm: Denies rash, itching, dry skin Psych: Denies depression, anxiety, memory loss, confusion. No homicidal or suicidal ideation.  Heme: Denies bruising, bleeding, and enlarged lymph nodes.  Physical Exam: BP (!) 147/81   Pulse 81   Temp (!) 97.4 F (36.3 C) (Oral)   Ht 6' (1.829 m)   Wt (!) 314 lb 9.6 oz (142.7 kg)   BMI 42.67 kg/m  General:   Alert and oriented. No distress noted. Pleasant and cooperative.  Head:  Normocephalic and atraumatic. Eyes:  Conjuctiva clear without scleral icterus. Mouth:  Oral mucosa pink and moist. Good dentition. No lesions. Abdomen:  +BS, soft, non-tender and non-distended. No rebound or guarding. No HSM or masses noted. Msk:  Symmetrical without gross deformities. Normal posture. Extremities:  Without edema. Neurologic:  Alert and  oriented x4 Psych:  Alert and cooperative. Normal mood and affect.

## 2017-04-19 NOTE — Patient Instructions (Signed)
I will get blood work from Dr. Melina Copa to have on file here.  We will touch base on when your next colonoscopy will be!  It was a pleasure to see you today. I strive to create trusting relationships with patients to provide genuine, compassionate, and quality care. I value your feedback. If you receive a survey regarding your visit,  I greatly appreciate you the taking time to fill this out.   Annitta Needs, PhD, ANP-BC [facility]

## 2017-04-20 ENCOUNTER — Other Ambulatory Visit: Payer: Self-pay | Admitting: Gastroenterology

## 2017-04-22 ENCOUNTER — Encounter: Payer: Self-pay | Admitting: Gastroenterology

## 2017-04-22 ENCOUNTER — Telehealth: Payer: Self-pay | Admitting: Gastroenterology

## 2017-04-22 NOTE — Telephone Encounter (Signed)
Patient just recently seen last week. He is due for surveillance colonoscopy due to history of Crohn's.   Please let him know. I have already discussed with him possibly needing this now. Needs TCS with PROPOFOL with RMR.

## 2017-04-22 NOTE — Assessment & Plan Note (Addendum)
Clinically doing well. Has been on Lialda chronically. Requesting labs from PCP, which he believes have been done recently. Last colonoscopy was completed in 2016, and he is due for surveillance colonoscopy at this point.     Proceed with TCS with Dr. Gala Romney in near future: the risks, benefits, and alternatives have been discussed with the patient in detail. The patient states understanding and desires to proceed. Propofol due to history of intermittent narcotic use Obtain outside labs to have on file: received dated Sept 2018. Hgb 14.6, Platelets 227, BUN 11, Cr 0.83. Albumin 4.6. LFTs all normal. Will scan.  1 year follow-up

## 2017-04-24 NOTE — Telephone Encounter (Signed)
LMOVM

## 2017-04-24 NOTE — Progress Notes (Signed)
CC'D TO PCP °

## 2017-04-25 ENCOUNTER — Other Ambulatory Visit: Payer: Self-pay | Admitting: *Deleted

## 2017-04-25 ENCOUNTER — Encounter: Payer: Self-pay | Admitting: *Deleted

## 2017-04-25 DIAGNOSIS — K5080 Crohn's disease of both small and large intestine without complications: Secondary | ICD-10-CM

## 2017-04-25 MED ORDER — PEG 3350-KCL-NA BICARB-NACL 420 G PO SOLR: 4000.0000 mL | Freq: Once | ORAL | 0 refills | Status: AC

## 2017-04-25 NOTE — Telephone Encounter (Signed)
LMOVM

## 2017-04-25 NOTE — Telephone Encounter (Signed)
Letter mailed to pt.  

## 2017-04-25 NOTE — Telephone Encounter (Signed)
Patient called back and TCS W/ PROPOFOL W/ RMR is scheduled for 05/25/17 at 8:15am. Prep sent to the pharmacy. Instructions mailed to pt.

## 2017-04-25 NOTE — Telephone Encounter (Signed)
LMOVM. Pre-op scheduled for 05/18/17 at 11:00am. Letter mailed.

## 2017-05-15 NOTE — Patient Instructions (Signed)
Ricky Blackwell  05/15/2017     @PREFPERIOPPHARMACY @   Your procedure is scheduled on  05/25/2017   Report to [facility] at  645  A.M.  Call this number if you have problems the morning of surgery:  [phone]   Remember:  Do not eat food or drink liquids after midnight.  Take these medicines the morning of surgery with A SIP OF WATER  Lisinopril, percocet, flomax.   Do not wear jewelry, make-up or nail polish.  Do not wear lotions, powders, or perfumes, or deodorant.  Do not shave 48 hours prior to surgery.  Men may shave face and neck.  Do not bring valuables to the hospital.  [facility] is not responsible for any belongings or valuables.  Contacts, dentures or bridgework may not be worn into surgery.  Leave your suitcase in the car.  After surgery it may be brought to your room.  For patients admitted to the hospital, discharge time will be determined by your treatment team.  Patients discharged the day of surgery will not be allowed to drive home.   Name and phone number of your driver:   family Special instructions:  Follow the diet and prep instructions given to you by Dr Roseanne Kaufman office.  Please read over the following fact sheets that you were given. Anesthesia Post-op Instructions and Care and Recovery After Surgery       Colonoscopy, Adult A colonoscopy is an exam to look at the large intestine. It is done to check for problems, such as:  Lumps (tumors).  Growths (polyps).  Swelling (inflammation).  Bleeding.  What happens before the procedure? Eating and drinking Follow instructions from your doctor about eating and drinking. These instructions may include:  A few days before the procedure - follow a low-fiber diet. ? Avoid nuts. ? Avoid seeds. ? Avoid dried fruit. ? Avoid raw fruits. ? Avoid vegetables.  1-3 days before the procedure - follow a clear liquid diet. Avoid liquids that have red or purple dye. Drink only clear  liquids, such as: ? Clear broth or bouillon. ? Black coffee or tea. ? Clear juice. ? Clear soft drinks or sports drinks. ? Gelatin dessert. ? Popsicles.  On the day of the procedure - do not eat or drink anything during the 2 hours before the procedure.  Bowel prep If you were prescribed an oral bowel prep:  Take it as told by your doctor. Starting the day before your procedure, you will need to drink a lot of liquid. The liquid will cause you to poop (have bowel movements) until your poop is almost clear or light green.  If your skin or butt gets irritated from diarrhea, you may: ? Wipe the area with wipes that have medicine in them, such as adult wet wipes with aloe and vitamin E. ? Put something on your skin that soothes the area, such as petroleum jelly.  If you throw up (vomit) while drinking the bowel prep, take a break for up to 60 minutes. Then begin the bowel prep again. If you keep throwing up and you cannot take the bowel prep without throwing up, call your doctor.  General instructions  Ask your doctor about changing or stopping your normal medicines. This is important if you take diabetes medicines or blood thinners.  Plan to have someone take you home from the hospital or clinic. What happens during the procedure?  An IV  tube may be put into one of your veins.  You will be given medicine to help you relax (sedative).  To reduce your risk of infection: ? Your doctors will wash their hands. ? Your anal area will be washed with soap.  You will be asked to lie on your side with your knees bent.  Your doctor will get a long, thin, flexible tube ready. The tube will have a camera and a light on the end.  The tube will be put into your anus.  The tube will be gently put into your large intestine.  Air will be delivered into your large intestine to keep it open. You may feel some pressure or cramping.  The camera will be used to take photos.  A small tissue  sample may be removed from your body to be looked at under a microscope (biopsy). If any possible problems are found, the tissue will be sent to a lab for testing.  If small growths are found, your doctor may remove them and have them checked for cancer.  The tube that was put into your anus will be slowly removed. The procedure may vary among doctors and hospitals. What happens after the procedure?  Your doctor will check on you often until the medicines you were given have worn off.  Do not drive for 24 hours after the procedure.  You may have a small amount of blood in your poop.  You may pass gas.  You may have mild cramps or bloating in your belly (abdomen).  It is up to you to get the results of your procedure. Ask your doctor, or the department performing the procedure, when your results will be ready. This information is not intended to replace advice given to you by your health care provider. Make sure you discuss any questions you have with your health care provider. Document Released: 04/30/2010 Document Revised: 01/27/2016 Document Reviewed: 06/09/2015 Elsevier Interactive Patient Education  2017 [employer]  Colonoscopy, Adult, Care After This sheet gives you information about how to care for yourself after your procedure. Your health care provider may also give you more specific instructions. If you have problems or questions, contact your health care provider. What can I expect after the procedure? After the procedure, it is common to have:  A small amount of blood in your stool for 24 hours after the procedure.  Some gas.  Mild abdominal cramping or bloating.  Follow these instructions at home: General instructions   For the first 24 hours after the procedure: ? Do not drive or use machinery. ? Do not sign important documents. ? Do not drink alcohol. ? Do your regular daily activities at a slower pace than normal. ? Eat soft, easy-to-digest foods. ? Rest  often.  Take over-the-counter or prescription medicines only as told by your health care provider.  It is up to you to get the results of your procedure. Ask your health care provider, or the department performing the procedure, when your results will be ready. Relieving cramping and bloating  Try walking around when you have cramps or feel bloated.  Apply heat to your abdomen as told by your health care provider. Use a heat source that your health care provider recommends, such as a moist heat pack or a heating pad. ? Place a towel between your skin and the heat source. ? Leave the heat on for 20-30 minutes. ? Remove the heat if your skin turns bright red. This is especially important  if you are unable to feel pain, heat, or cold. You may have a greater risk of getting burned. Eating and drinking  Drink enough fluid to keep your urine clear or pale yellow.  Resume your normal diet as instructed by your health care provider. Avoid heavy or fried foods that are hard to digest.  Avoid drinking alcohol for as long as instructed by your health care provider. Contact a health care provider if:  You have blood in your stool 2-3 days after the procedure. Get help right away if:  You have more than a small spotting of blood in your stool.  You pass large blood clots in your stool.  Your abdomen is swollen.  You have nausea or vomiting.  You have a fever.  You have increasing abdominal pain that is not relieved with medicine. This information is not intended to replace advice given to you by your health care provider. Make sure you discuss any questions you have with your health care provider. Document Released: 11/10/2003 Document Revised: 12/21/2015 Document Reviewed: 06/09/2015 Elsevier Interactive Patient Education  2018 [place] Anesthesia is a term that refers to techniques, procedures, and medicines that help a person stay safe and comfortable  during a medical procedure. Monitored anesthesia care, or sedation, is one type of anesthesia. Your anesthesia specialist may recommend sedation if you will be having a procedure that does not require you to be unconscious, such as:  Cataract surgery.  A dental procedure.  A biopsy.  A colonoscopy.  During the procedure, you may receive a medicine to help you relax (sedative). There are three levels of sedation:  Mild sedation. At this level, you may feel awake and relaxed. You will be able to follow directions.  Moderate sedation. At this level, you will be sleepy. You may not remember the procedure.  Deep sedation. At this level, you will be asleep. You will not remember the procedure.  The more medicine you are given, the deeper your level of sedation will be. Depending on how you respond to the procedure, the anesthesia specialist may change your level of sedation or the type of anesthesia to fit your needs. An anesthesia specialist will monitor you closely during the procedure. Let your health care provider know about:  Any allergies you have.  All medicines you are taking, including vitamins, herbs, eye drops, creams, and over-the-counter medicines.  Any use of steroids (by mouth or as a cream).  Any problems you or family members have had with sedatives and anesthetic medicines.  Any blood disorders you have.  Any surgeries you have had.  Any medical conditions you have, such as sleep apnea.  Whether you are pregnant or may be pregnant.  Any use of cigarettes, alcohol, or street drugs. What are the risks? Generally, this is a safe procedure. However, problems may occur, including:  Getting too much medicine (oversedation).  Nausea.  Allergic reaction to medicines.  Trouble breathing. If this happens, a breathing tube may be used to help with breathing. It will be removed when you are awake and breathing on your own.  Heart trouble.  Lung trouble.  Before  the procedure Staying hydrated Follow instructions from your health care provider about hydration, which may include:  Up to 2 hours before the procedure - you may continue to drink clear liquids, such as water, clear fruit juice, black coffee, and plain tea.  Eating and drinking restrictions Follow instructions from your health care provider about eating  and drinking, which may include:  8 hours before the procedure - stop eating heavy meals or foods such as meat, fried foods, or fatty foods.  6 hours before the procedure - stop eating light meals or foods, such as toast or cereal.  6 hours before the procedure - stop drinking milk or drinks that contain milk.  2 hours before the procedure - stop drinking clear liquids.  Medicines Ask your health care provider about:  Changing or stopping your regular medicines. This is especially important if you are taking diabetes medicines or blood thinners.  Taking medicines such as aspirin and ibuprofen. These medicines can thin your blood. Do not take these medicines before your procedure if your health care provider instructs you not to.  Tests and exams  You will have a physical exam.  You may have blood tests done to show: ? How well your kidneys and liver are working. ? How well your blood can clot.  General instructions  Plan to have someone take you home from the hospital or clinic.  If you will be going home right after the procedure, plan to have someone with you for 24 hours.  What happens during the procedure?  Your blood pressure, heart rate, breathing, level of pain and overall condition will be monitored.  An IV tube will be inserted into one of your veins.  Your anesthesia specialist will give you medicines as needed to keep you comfortable during the procedure. This may mean changing the level of sedation.  The procedure will be performed. After the procedure  Your blood pressure, heart rate, breathing rate, and  blood oxygen level will be monitored until the medicines you were given have worn off.  Do not drive for 24 hours if you received a sedative.  You may: ? Feel sleepy, clumsy, or nauseous. ? Feel forgetful about what happened after the procedure. ? Have a sore throat if you had a breathing tube during the procedure. ? Vomit. This information is not intended to replace advice given to you by your health care provider. Make sure you discuss any questions you have with your health care provider. Document Released: 12/22/2004 Document Revised: 09/04/2015 Document Reviewed: 07/19/2015 Elsevier Interactive Patient Education  2018 [place], Care After These instructions provide you with information about caring for yourself after your procedure. Your health care provider may also give you more specific instructions. Your treatment has been planned according to current medical practices, but problems sometimes occur. Call your health care provider if you have any problems or questions after your procedure. What can I expect after the procedure? After your procedure, it is common to:  Feel sleepy for several hours.  Feel clumsy and have poor balance for several hours.  Feel forgetful about what happened after the procedure.  Have poor judgment for several hours.  Feel nauseous or vomit.  Have a sore throat if you had a breathing tube during the procedure.  Follow these instructions at home: For at least 24 hours after the procedure:   Do not: ? Participate in activities in which you could fall or become injured. ? Drive. ? Use heavy machinery. ? Drink alcohol. ? Take sleeping pills or medicines that cause drowsiness. ? Make important decisions or sign legal documents. ? Take care of children on your own.  Rest. Eating and drinking  Follow the diet that is recommended by your health care provider.  If you vomit, drink water, juice, or soup when  you  can drink without vomiting.  Make sure you have little or no nausea before eating solid foods. General instructions  Have a responsible adult stay with you until you are awake and alert.  Take over-the-counter and prescription medicines only as told by your health care provider.  If you smoke, do not smoke without supervision.  Keep all follow-up visits as told by your health care provider. This is important. Contact a health care provider if:  You keep feeling nauseous or you keep vomiting.  You feel light-headed.  You develop a rash.  You have a fever. Get help right away if:  You have trouble breathing. This information is not intended to replace advice given to you by your health care provider. Make sure you discuss any questions you have with your health care provider. Document Released: 07/19/2015 Document Revised: 11/18/2015 Document Reviewed: 07/19/2015 Elsevier Interactive Patient Education  [employer].

## 2017-05-18 ENCOUNTER — Telehealth: Payer: Self-pay

## 2017-05-18 ENCOUNTER — Encounter (HOSPITAL_COMMUNITY): Payer: Self-pay

## 2017-05-18 ENCOUNTER — Other Ambulatory Visit (HOSPITAL_COMMUNITY): Payer: BLUE CROSS/BLUE SHIELD

## 2017-05-18 ENCOUNTER — Encounter (HOSPITAL_COMMUNITY)
Admission: RE | Admit: 2017-05-18 | Discharge: 2017-05-18 | Disposition: A | Discharge status: Home / Self Care | Payer: BLUE CROSS/BLUE SHIELD | Source: Ambulatory Visit | Attending: Internal Medicine | Admitting: Internal Medicine

## 2017-05-18 NOTE — Telephone Encounter (Signed)
Ricky Blackwell at [facility] called office and LMOVM. Pt was no show for pre-op appt today. Tried to call pt, no answer, Ricky Blackwell phone number for endo scheduler to reschedule appt. Advised he will not be able to have procedure without going to pre-op appt.

## 2017-05-19 NOTE — Telephone Encounter (Signed)
LMOVM

## 2017-05-19 NOTE — Telephone Encounter (Signed)
Spoke with pt spouse and she has been given the # for [employer]. She is going to call and get this rescheduled. Made Hoyle Sauer aware as well.

## 2017-05-22 ENCOUNTER — Other Ambulatory Visit: Payer: Self-pay

## 2017-05-22 ENCOUNTER — Encounter (HOSPITAL_COMMUNITY)
Admission: RE | Admit: 2017-05-22 | Discharge: 2017-05-22 | Disposition: A | Discharge status: Home / Self Care | Payer: BLUE CROSS/BLUE SHIELD | Source: Ambulatory Visit | Attending: Internal Medicine | Admitting: Internal Medicine

## 2017-05-22 ENCOUNTER — Encounter (HOSPITAL_COMMUNITY): Payer: Self-pay

## 2017-05-22 DIAGNOSIS — D124 Benign neoplasm of descending colon: Secondary | ICD-10-CM | POA: Diagnosis not present

## 2017-05-22 DIAGNOSIS — Z87891 Personal history of nicotine dependence: Secondary | ICD-10-CM | POA: Diagnosis not present

## 2017-05-22 DIAGNOSIS — K626 Ulcer of anus and rectum: Secondary | ICD-10-CM | POA: Diagnosis not present

## 2017-05-22 DIAGNOSIS — Z8261 Family history of arthritis: Secondary | ICD-10-CM | POA: Diagnosis not present

## 2017-05-22 DIAGNOSIS — K5900 Constipation, unspecified: Secondary | ICD-10-CM | POA: Diagnosis not present

## 2017-05-22 DIAGNOSIS — I10 Essential (primary) hypertension: Secondary | ICD-10-CM | POA: Diagnosis not present

## 2017-05-22 DIAGNOSIS — Z8249 Family history of ischemic heart disease and other diseases of the circulatory system: Secondary | ICD-10-CM | POA: Diagnosis not present

## 2017-05-22 DIAGNOSIS — K529 Noninfective gastroenteritis and colitis, unspecified: Secondary | ICD-10-CM | POA: Diagnosis not present

## 2017-05-22 DIAGNOSIS — K449 Diaphragmatic hernia without obstruction or gangrene: Secondary | ICD-10-CM | POA: Diagnosis not present

## 2017-05-22 DIAGNOSIS — M1990 Unspecified osteoarthritis, unspecified site: Secondary | ICD-10-CM | POA: Diagnosis not present

## 2017-05-22 DIAGNOSIS — Z825 Family history of asthma and other chronic lower respiratory diseases: Secondary | ICD-10-CM | POA: Diagnosis not present

## 2017-05-22 DIAGNOSIS — Z811 Family history of alcohol abuse and dependence: Secondary | ICD-10-CM | POA: Diagnosis not present

## 2017-05-22 DIAGNOSIS — Z8371 Family history of colonic polyps: Secondary | ICD-10-CM | POA: Diagnosis not present

## 2017-05-22 DIAGNOSIS — K5080 Crohn's disease of both small and large intestine without complications: Secondary | ICD-10-CM | POA: Diagnosis not present

## 2017-05-22 DIAGNOSIS — Z833 Family history of diabetes mellitus: Secondary | ICD-10-CM | POA: Diagnosis not present

## 2017-05-22 DIAGNOSIS — Z1211 Encounter for screening for malignant neoplasm of colon: Secondary | ICD-10-CM | POA: Diagnosis present

## 2017-05-22 DIAGNOSIS — Z79899 Other long term (current) drug therapy: Secondary | ICD-10-CM | POA: Diagnosis not present

## 2017-05-22 DIAGNOSIS — Z96652 Presence of left artificial knee joint: Secondary | ICD-10-CM | POA: Diagnosis not present

## 2017-05-22 DIAGNOSIS — Z87442 Personal history of urinary calculi: Secondary | ICD-10-CM | POA: Diagnosis not present

## 2017-05-22 HISTORY — DX: Essential (primary) hypertension: I10

## 2017-05-22 LAB — CBC WITH DIFFERENTIAL/PLATELET
Basophils Absolute: 0 10*3/uL (ref 0.0–0.1)
Basophils Relative: 0 %
EOS ABS: 0.2 10*3/uL (ref 0.0–0.7)
Eosinophils Relative: 2 %
HCT: 42.5 % (ref 39.0–52.0)
Hemoglobin: 13.8 g/dL (ref 13.0–17.0)
LYMPHS PCT: 27 %
Lymphs Abs: 2 10*3/uL (ref 0.7–4.0)
MCH: 29.5 pg (ref 26.0–34.0)
MCHC: 32.5 g/dL (ref 30.0–36.0)
MCV: 90.8 fL (ref 78.0–100.0)
MONO ABS: 0.7 10*3/uL (ref 0.1–1.0)
Monocytes Relative: 9 %
Neutro Abs: 4.5 10*3/uL (ref 1.7–7.7)
Neutrophils Relative %: 62 %
PLATELETS: 226 10*3/uL (ref 150–400)
RBC: 4.68 MIL/uL (ref 4.22–5.81)
RDW: 12.9 % (ref 11.5–15.5)
WBC: 7.4 10*3/uL (ref 4.0–10.5)

## 2017-05-22 LAB — BASIC METABOLIC PANEL
Anion gap: 9 (ref 5–15)
BUN: 14 mg/dL (ref 6–20)
CO2: 24 mmol/L (ref 22–32)
CREATININE: 0.92 mg/dL (ref 0.61–1.24)
Calcium: 8.8 mg/dL — ABNORMAL LOW (ref 8.9–10.3)
Chloride: 104 mmol/L (ref 101–111)
GFR calc Af Amer: >60 mL/min (ref >60)
GFR calc non Af Amer: >60 mL/min (ref >60)
GLUCOSE: 141 mg/dL — AB (ref 65–99)
POTASSIUM: 3.7 mmol/L (ref 3.5–5.1)
Sodium: 137 mmol/L (ref 135–145)

## 2017-05-25 ENCOUNTER — Ambulatory Visit (HOSPITAL_COMMUNITY)
Admission: RE | Admit: 2017-05-25 | Discharge: 2017-05-25 | Disposition: A | Discharge status: Home / Self Care | Payer: BLUE CROSS/BLUE SHIELD | Source: Ambulatory Visit | Attending: Internal Medicine | Admitting: Internal Medicine

## 2017-05-25 ENCOUNTER — Encounter (HOSPITAL_COMMUNITY): Payer: Self-pay | Admitting: *Deleted

## 2017-05-25 ENCOUNTER — Ambulatory Visit (HOSPITAL_COMMUNITY): Payer: BLUE CROSS/BLUE SHIELD | Admitting: Anesthesiology

## 2017-05-25 ENCOUNTER — Encounter (HOSPITAL_COMMUNITY)
Admission: RE | Disposition: A | Discharge status: Home / Self Care | Payer: Self-pay | Source: Ambulatory Visit | Attending: Internal Medicine

## 2017-05-25 DIAGNOSIS — Z87891 Personal history of nicotine dependence: Secondary | ICD-10-CM | POA: Insufficient documentation

## 2017-05-25 DIAGNOSIS — Z833 Family history of diabetes mellitus: Secondary | ICD-10-CM | POA: Insufficient documentation

## 2017-05-25 DIAGNOSIS — D124 Benign neoplasm of descending colon: Secondary | ICD-10-CM

## 2017-05-25 DIAGNOSIS — Z825 Family history of asthma and other chronic lower respiratory diseases: Secondary | ICD-10-CM | POA: Insufficient documentation

## 2017-05-25 DIAGNOSIS — K5080 Crohn's disease of both small and large intestine without complications: Secondary | ICD-10-CM

## 2017-05-25 DIAGNOSIS — Z96652 Presence of left artificial knee joint: Secondary | ICD-10-CM | POA: Insufficient documentation

## 2017-05-25 DIAGNOSIS — Z87442 Personal history of urinary calculi: Secondary | ICD-10-CM | POA: Insufficient documentation

## 2017-05-25 DIAGNOSIS — K5900 Constipation, unspecified: Secondary | ICD-10-CM | POA: Insufficient documentation

## 2017-05-25 DIAGNOSIS — Z8261 Family history of arthritis: Secondary | ICD-10-CM | POA: Insufficient documentation

## 2017-05-25 DIAGNOSIS — K529 Noninfective gastroenteritis and colitis, unspecified: Secondary | ICD-10-CM | POA: Insufficient documentation

## 2017-05-25 DIAGNOSIS — Z1211 Encounter for screening for malignant neoplasm of colon: Secondary | ICD-10-CM | POA: Diagnosis not present

## 2017-05-25 DIAGNOSIS — K626 Ulcer of anus and rectum: Secondary | ICD-10-CM | POA: Insufficient documentation

## 2017-05-25 DIAGNOSIS — K449 Diaphragmatic hernia without obstruction or gangrene: Secondary | ICD-10-CM | POA: Insufficient documentation

## 2017-05-25 DIAGNOSIS — M1990 Unspecified osteoarthritis, unspecified site: Secondary | ICD-10-CM | POA: Insufficient documentation

## 2017-05-25 DIAGNOSIS — I10 Essential (primary) hypertension: Secondary | ICD-10-CM | POA: Insufficient documentation

## 2017-05-25 DIAGNOSIS — Z79899 Other long term (current) drug therapy: Secondary | ICD-10-CM | POA: Insufficient documentation

## 2017-05-25 DIAGNOSIS — Z8249 Family history of ischemic heart disease and other diseases of the circulatory system: Secondary | ICD-10-CM | POA: Insufficient documentation

## 2017-05-25 DIAGNOSIS — Z811 Family history of alcohol abuse and dependence: Secondary | ICD-10-CM | POA: Insufficient documentation

## 2017-05-25 DIAGNOSIS — Z8371 Family history of colonic polyps: Secondary | ICD-10-CM | POA: Insufficient documentation

## 2017-05-25 HISTORY — PX: POLYPECTOMY: SHX5525

## 2017-05-25 HISTORY — PX: COLONOSCOPY WITH PROPOFOL: SHX5780

## 2017-05-25 SURGERY — COLONOSCOPY WITH PROPOFOL
Anesthesia: Monitor Anesthesia Care

## 2017-05-25 MED ORDER — MIDAZOLAM HCL 2 MG/2ML IJ SOLN
1.0000 mg | INTRAMUSCULAR | Status: AC
  Administered 2017-05-25: 2 mg via INTRAVENOUS

## 2017-05-25 MED ORDER — LACTATED RINGERS IV SOLN
INTRAVENOUS | Status: DC
  Administered 2017-05-25: 08:00:00 via INTRAVENOUS

## 2017-05-25 MED ORDER — PROPOFOL 500 MG/50ML IV EMUL
INTRAVENOUS | Status: DC | PRN
  Administered 2017-05-25: 150 ug/kg/min via INTRAVENOUS
  Administered 2017-05-25 (×2): via INTRAVENOUS

## 2017-05-25 MED ORDER — FENTANYL CITRATE (PF) 100 MCG/2ML IJ SOLN
INTRAMUSCULAR | Status: AC
  Filled 2017-05-25: qty 2

## 2017-05-25 MED ORDER — FENTANYL CITRATE (PF) 100 MCG/2ML IJ SOLN
25.0000 ug | Freq: Once | INTRAMUSCULAR | Status: AC
  Administered 2017-05-25: 25 ug via INTRAVENOUS

## 2017-05-25 MED ORDER — MIDAZOLAM HCL 2 MG/2ML IJ SOLN
INTRAMUSCULAR | Status: AC
  Filled 2017-05-25: qty 2

## 2017-05-25 MED ORDER — STERILE WATER FOR IRRIGATION IR SOLN
Status: DC | PRN
  Administered 2017-05-25: 15 mL

## 2017-05-25 NOTE — Anesthesia Preprocedure Evaluation (Signed)
Anesthesia Evaluation  Patient identified by MRN, date of birth, ID band Patient awake    Airway Mallampati: II  TM Distance: >3 FB Neck ROM: Full    Dental  (+) Teeth Intact,    Pulmonary former smoker,    breath sounds clear to auscultation       Cardiovascular hypertension, Pt. on medications  Rhythm:Regular Rate:Normal     Neuro/Psych negative neurological ROS  negative psych ROS   GI/Hepatic hiatal hernia,   Endo/Other  Morbid obesity  Renal/GU      Musculoskeletal   Abdominal   Peds  Hematology   Anesthesia Other Findings   Reproductive/Obstetrics                             Anesthesia Physical Anesthesia Plan  ASA: II  Anesthesia Plan: MAC   Post-op Pain Management:    Induction: Intravenous  PONV Risk Score and Plan:   Airway Management Planned: Simple Face Mask  Additional Equipment:   Intra-op Plan:   Post-operative Plan:   Informed Consent: I have reviewed the patients History and Physical, chart, labs and discussed the procedure including the risks, benefits and alternatives for the proposed anesthesia with the patient or authorized representative who has indicated his/her understanding and acceptance.     Plan Discussed with:   Anesthesia Plan Comments:         Anesthesia Quick Evaluation

## 2017-05-25 NOTE — Anesthesia Postprocedure Evaluation (Signed)
Anesthesia Post Note  Patient: Ricky Blackwell  Procedure(s) Performed: COLONOSCOPY WITH PROPOFOL (N/A ) POLYPECTOMY  Patient location during evaluation: PACU Anesthesia Type: MAC Level of consciousness: awake and alert and oriented Pain management: pain level controlled Vital Signs Assessment: post-procedure vital signs reviewed and stable Respiratory status: spontaneous breathing Cardiovascular status: stable Postop Assessment: no apparent nausea or vomiting Anesthetic complications: no     Last Vitals:  Vitals:   05/25/17 0654  BP: 126/61  Resp: 18  Temp: 36.8 C  SpO2: 94%    Last Pain:  Vitals:   05/25/17 0654  TempSrc: Oral                 ADAMS, AMY A

## 2017-05-25 NOTE — Transfer of Care (Signed)
Immediate Anesthesia Transfer of Care Note  Patient: Ricky Blackwell  Procedure(s) Performed: COLONOSCOPY WITH PROPOFOL (N/A ) POLYPECTOMY  Patient Location: PACU  Anesthesia Type:MAC  Level of Consciousness: awake, alert , oriented and patient cooperative  Airway & Oxygen Therapy: Patient Spontanous Breathing and Patient connected to nasal cannula oxygen  Post-op Assessment: Report given to RN and Post -op Vital signs reviewed and stable  Post vital signs: Reviewed and stable  Last Vitals:  Vitals:   05/25/17 0654  BP: 126/61  Resp: 18  Temp: 36.8 C  SpO2: 94%    Last Pain:  Vitals:   05/25/17 0654  TempSrc: Oral         Complications: No apparent anesthesia complications

## 2017-05-25 NOTE — Discharge Instructions (Signed)
Colonoscopy Discharge Instructions  Read the instructions outlined below and refer to this sheet in the next few weeks. These discharge instructions provide you with general information on caring for yourself after you leave the hospital. Your doctor may also give you specific instructions. While your treatment has been planned according to the most current medical practices available, unavoidable complications occasionally occur. If you have any problems or questions after discharge, call Dr. Gala Romney at [phone]. ACTIVITY  You may resume your regular activity, but move at a slower pace for the next 24 hours.   Take frequent rest periods for the next 24 hours.   Walking will help get rid of the air and reduce the bloated feeling in your belly (abdomen).   No driving for 24 hours (because of the medicine (anesthesia) used during the test).    Do not sign any important legal documents or operate any machinery for 24 hours (because of the anesthesia used during the test).  NUTRITION  Drink plenty of fluids.   You may resume your normal diet as instructed by your doctor.   Begin with a light meal and progress to your normal diet. Heavy or fried foods are harder to digest and may make you feel sick to your stomach (nauseated).   Avoid alcoholic beverages for 24 hours or as instructed.  MEDICATIONS  You may resume your normal medications unless your doctor tells you otherwise.  WHAT YOU CAN EXPECT TODAY  Some feelings of bloating in the abdomen.   Passage of more gas than usual.   Spotting of blood in your stool or on the toilet paper.  IF YOU HAD POLYPS REMOVED DURING THE COLONOSCOPY:  No aspirin products for 7 days or as instructed.   No alcohol for 7 days or as instructed.   Eat a soft diet for the next 24 hours.  FINDING OUT THE RESULTS OF YOUR TEST Not all test results are available during your visit. If your test results are not back during the visit, make an appointment  with your caregiver to find out the results. Do not assume everything is normal if you have not heard from your caregiver or the medical facility. It is important for you to follow up on all of your test results.  SEEK IMMEDIATE MEDICAL ATTENTION IF:  You have more than a spotting of blood in your stool.   Your belly is swollen (abdominal distention).   You are nauseated or vomiting.   You have a temperature over 101.   You have abdominal pain or discomfort that is severe or gets worse throughout the day.    Further recommendations to follow pending review of pathology report  See dermatologist about lesion on buttock.    PATIENT INSTRUCTIONS POST-ANESTHESIA  IMMEDIATELY FOLLOWING SURGERY:  Do not drive or operate machinery for the first twenty four hours after surgery.  Do not make any important decisions for twenty four hours after surgery or while taking narcotic pain medications or sedatives.  If you develop intractable nausea and vomiting or a severe headache please notify your doctor immediately.  FOLLOW-UP:  Please make an appointment with your surgeon as instructed. You do not need to follow up with anesthesia unless specifically instructed to do so.  WOUND CARE INSTRUCTIONS (if applicable):  Keep a dry clean dressing on the anesthesia/puncture wound site if there is drainage.  Once the wound has quit draining you may leave it open to air.  Generally you should leave the bandage intact for  twenty four hours unless there is drainage.  If the epidural site drains for more than 36-48 hours please call the anesthesia department.  QUESTIONS?:  Please feel free to call your physician or the hospital operator if you have any questions, and they will be happy to assist you.

## 2017-05-25 NOTE — Anesthesia Procedure Notes (Signed)
Procedure Name: MAC Date/Time: 05/25/2017 8:00 AM Performed by: Andree Elk Prudy Candy A, CRNA Pre-anesthesia Checklist: Patient identified, Emergency Drugs available, Suction available, Patient being monitored and Timeout performed Oxygen Delivery Method: Simple face mask

## 2017-05-25 NOTE — Op Note (Signed)
[facility] Patient Name: Ricky Blackwell Procedure Date: 05/25/2017 7:52 AM MRN: [id number] Date of Birth: Sep 19, 1960 Attending MD: Norvel Richards , MD CSN: [id number] Age: 57 Admit Type: Outpatient Procedure:                Colonoscopy Indications:              High risk colon cancer surveillance: Crohn's small                            and large intestine Providers:                Norvel Richards, MD, Charlsie Quest. Theda Sers RN, RN,                            Rosina Lowenstein, RN Referring MD:              Medicines:                Propofol per Anesthesia Complications:            No immediate complications. Estimated Blood Loss:     Estimated blood loss was minimal. Procedure:                Pre-Anesthesia Assessment:                           - Prior to the procedure, a History and Physical                            was performed, and patient medications and                            allergies were reviewed. The patient's tolerance of                            previous anesthesia was also reviewed. The risks                            and benefits of the procedure and the sedation                            options and risks were discussed with the patient.                            All questions were answered, and informed consent                            was obtained. Prior Anticoagulants: The patient has                            taken no previous anticoagulant or antiplatelet                            agents. ASA Grade Assessment: II - A patient with  mild systemic disease. After reviewing the risks                            and benefits, the patient was deemed in                            satisfactory condition to undergo the procedure.                           After obtaining informed consent, the colonoscope                            was passed under direct vision. Throughout the                            procedure, the  patient's blood pressure, pulse, and                            oxygen saturations were monitored continuously. The                            EC-[id number] ([id number]) scope was introduced through                            the and advanced to the the terminal ileum. The                            colonoscopy was performed without difficulty. The                            patient tolerated the procedure well. The quality                            of the bowel preparation was adequate. The entire                            colon was well visualized. The quality of the bowel                            preparation was adequate. The terminal ileum,                            ileocecal valve, appendiceal orifice, and rectum                            and the ileocecal valve, appendiceal orifice, and                            rectum were photographed. The EC-[id number] ([id number])                            scope was introduced through the and advanced to  the 5 cm into the ileum. Scope In: 8:08:20 AM Scope Out: 8:25:12 AM Scope Withdrawal Time: 0 hours 14 minutes 10 seconds  Total Procedure Duration: 0 hours 16 minutes 52 seconds  Findings:      The perianal and digital rectal examinations were normal. Scattered       aphthous ulceration involving the rectum and ascending segments with       relative sparing of the sigmoid and transverse segments. Ulceration       extended on to the ICV and into the distal 5 entimeters of terminal       ileum. Ulcers appeared superficial ; patchy erythema in between these       lesions. In the descending segment, there was a 4 mm sessile polyp.       Frequent segmental biopsies were taken. The polyp in the descending       segment was cold snare removed. Impression:               - Scattered ulcers and erythema involving the                            rectum colon and terminal ileum consistent with                            Crohn's disease.  Colonic polyp removed as described                            above. Moderate Sedation:      Moderate (conscious) sedation was personally administered by an       anesthesia professional. The following parameters were monitored: oxygen       saturation, heart rate, blood pressure, respiratory rate, EKG, adequacy       of pulmonary ventilation, and response to care. Total physician       intraservice time was 22 minutes. Recommendation:           - Patient has a contact number available for                            emergencies. The signs and symptoms of potential                            delayed complications were discussed with the                            patient. Return to normal activities tomorrow.                            Written discharge instructions were provided to the                            patient.                           - Repeat colonoscopy in 2 years for surveillance.                           - Return to GI office (date not yet determined).                           -  Resume previous diet. Procedure Code(s):        --- Professional ---                           [phone], Colonoscopy, flexible; diagnostic, including                            collection of specimen(s) by brushing or washing,                            when performed (separate procedure) Diagnosis Code(s):        --- Professional ---                           K50.80, Crohn's disease of both small and large                            intestine without complications CPT copyright 2016 American Medical Association. All rights reserved. The codes documented in this report are preliminary and upon coder review may  be revised to meet current compliance requirements. Cristopher Estimable. [name], MD Norvel Richards, MD 05/25/2017 8:38:08 AM This report has been signed electronically. Number of Addenda: 0

## 2017-05-25 NOTE — H&P (Signed)
@LOGO @   Primary Care Physician:  Octavio Graves, DO Primary Gastroenterologist:  Dr. Gala Romney  Pre-Procedure History & Physical: HPI:  Ricky Blackwell is a 57 y.o. male here for  for surveillance examination. 25 year history of ileocolonic Crohn's disease. Well controlled clinically.     Past Medical History:  Diagnosis Date  . Anal stenosis   . Arthritis   . Colitis   . Crohn's disease ([place])   . H/O hiatal hernia   . History of kidney stones   . Hypertension   . Irritable bowel syndrome   . Pneumonia    hx  . Rectal bleeding   . Unspecified constipation     Past Surgical History:  Procedure Laterality Date  . ABDOMINAL HERNIA REPAIR  2010  . BACK SURGERY    . COLONOSCOPY  05/2004   Dr. Amedeo Plenty- [facility]- crohns   . COLONOSCOPY  08/20/2008   Dr. Gala Romney- normal distal rectum, proximal rectum, scattered 1-2 mm aphthous ulcers, these extended into the distal sigmoid.  Marland Kitchen COLONOSCOPY N/A 10/03/2014   Mathews Stuhr: findings c/w mildly active ileocolonic crohn's with scattered aphthous ulcers in the distal sigmoid colon, descending colon, ascending colon, terminal ileum. Transverse colon was normal.  . KNEE SURGERY Right 2010   Left Arthoscopic  . NECK SURGERY    . NECK SURGERY N/A 2005   metal plate in neck  . TOTAL KNEE ARTHROPLASTY Left 02/22/2013   Procedure: LEFT TOTAL KNEE ARTHROPLASTY;  Surgeon: Yvette Rack., MD;  Location: [facility];  Service: Orthopedics;  Laterality: Left;    Prior to Admission medications   Medication Sig Start Date End Date Taking? Authorizing Provider  lisinopril (PRINIVIL,ZESTRIL) 10 MG tablet Take 10 mg by mouth daily.  03/23/17  Yes [provider]  mesalamine (LIALDA) 1.2 g EC tablet TAKE 4 (FOUR) TABLETS by mouth every evening. Patient taking differently: Take 4.8 g by mouth daily after supper.  04/06/16  Yes Mahala Menghini, PA-C  oxyCODONE-acetaminophen (PERCOCET) 10-325 MG tablet Take 1 tablet by mouth every 6 (six) hours as needed for  pain.   Yes [provider]  tamsulosin (FLOMAX) 0.4 MG CAPS capsule Take 0.4 mg by mouth daily after supper. 03/28/17  Yes [provider]  triamcinolone cream (KENALOG) 0.5 % Apply 1 application topically 2 (two) times daily as needed (for skin irritation).  08/06/14  Yes [provider]    Allergies as of 04/25/2017  . (No Known Allergies)    Family History  Problem Relation Age of Onset  . Diabetes Mother   . Heart failure Mother   . Heart failure Father   . Colon polyps Father   . Cancer - Lung Father   . COPD Sister   . Diabetes type II Sister   . Hypertension Sister   . Sleep apnea Sister   . Arthritis Sister   . Irregular heart beat Brother   . Alcoholism Brother   . Arthritis Brother   . Colon cancer Neg Hx     Social History   Socioeconomic History  . Marital status: Married    Spouse name: Not on file  . Number of children: Not on file  . Years of education: Not on file  . Highest education level: Not on file  Social Needs  . Financial resource strain: Not on file  . Food insecurity - worry: Not on file  . Food insecurity - inability: Not on file  . Transportation needs - medical: Not on  file  . Transportation needs - non-medical: Not on file  Occupational History  . Not on file  Tobacco Use  . Smoking status: Former Smoker    Packs/day: 2.00    Years: 10.00    Pack years: 20.00    Types: Cigarettes    Last attempt to quit: 02/13/1987    Years since quitting: 30.2  . Smokeless tobacco: Never Used  Substance and Sexual Activity  . Alcohol use: No  . Drug use: No  . Sexual activity: Yes    Birth control/protection: None  Other Topics Concern  . Not on file  Social History Narrative  . Not on file    Review of Systems: See HPI, otherwise negative ROS  Physical Exam: BP 126/61   Temp 98.2 F (36.8 C) (Oral)   Resp 18   SpO2 94%  General:   Alert,  Well-developed, well-nourished, pleasant and cooperative in  NAD Neck:  Supple; no masses or thyromegaly. No significant cervical adenopathy. Lungs:  Clear throughout to auscultation.   No wheezes, crackles, or rhonchi. No acute distress. Heart:  Regular rate and rhythm; no murmurs, clicks, rubs,  or gallops. Abdomen: Non-distended, normal bowel sounds.  Soft and nontender without appreciable mass or hepatosplenomegaly.  Pulses:  Normal pulses noted. Extremities:  Without clubbing or edema.  Impression:   Pleasant 41 year old gentleman with a 25 year history of ileocolonic Crohn's disease. Clinically doing well. Here for a surveillance colonoscopy.   Recommendations: I have offered the patient a surveillance colonoscopy today.  The risks, benefits, limitations, alternatives and imponderables have been reviewed with the patient. Questions have been answered. All parties are agreeable.   Notice: This dictation was prepared with Dragon dictation along with smaller phrase technology. Any transcriptional errors that result from this process are unintentional and may not be corrected upon review.

## 2017-05-26 ENCOUNTER — Encounter (HOSPITAL_COMMUNITY): Payer: Self-pay | Admitting: Internal Medicine

## 2017-06-04 ENCOUNTER — Encounter: Payer: Self-pay | Admitting: Internal Medicine

## 2018-01-15 ENCOUNTER — Other Ambulatory Visit: Payer: Self-pay | Admitting: Gastroenterology

## 2018-03-29 ENCOUNTER — Encounter: Payer: Self-pay | Admitting: Internal Medicine

## 2019-01-16 ENCOUNTER — Other Ambulatory Visit: Payer: Self-pay | Admitting: Gastroenterology

## 2019-01-16 NOTE — Telephone Encounter (Signed)
Please tell the patient he is overdue for OV. He last saw Vicente Males 04/2017. TCS 05/2017 and recommended 1 year follow-up.  Rx refill sent to pharmacy

## 2019-01-21 NOTE — Telephone Encounter (Signed)
Will call pt back. Pt wasn't available when called.

## 2019-05-15 ENCOUNTER — Encounter: Payer: Self-pay | Admitting: Internal Medicine

## 2019-09-18 NOTE — Progress Notes (Signed)
Referring Provider: Scotty Court, DO Primary Care Physician:  Scotty Court, DO Primary GI Physician: Dr. Gala Romney  Chief Complaint  Patient presents with  . Rectal Bleeding    7 or 8 bloody bm's this morning, L sided stabbing pain    HPI:   Ricky Blackwell is a 59 y.o. male presenting today with a history of ileocolonic Crohn's disease, diagnosed in 73. Also with history of anal stenosis previously seen by Dr. Morton Stall at [facility]. Has been maintained on Lialda 4.8g daily chronically.  Last colonoscopy Feb 2019 with scattered ulcers and erythema involving the rectum colon and terminal ileum consistent with Crohn's disease, one benign 4 mm sessile polyp, s/p frequent segmental biopsies.  Pathology with chronic mildly active ileitis and mildly active colitis in ascending colon, sigmoid colon, and rectum. Recommended continuing Lialda and repeat colonoscopy in 2 years. Last seen in our office in 2019  prior to TCS.   Today:  Bloody BMs started Monday. Had been doing very well until then. Started with LLQ pain and nausea. No vomiting.  About 14 BMs Monday. Most were bloody. Since then, intermittent cramping, LLQ pain. Continues with loose/runny BMs. This morning, pain has been more severe and had 7-8 BMs. Pain is constant about 6/10 with intermittent sharp pain 10/10. Stool quantity is little. Feels he has to go to the bathroom but sometimes doesn't or only passes a small amount.  Describes the blood as a little mass in the toilet. No melena.  Last good BM was Sunday.    Had a pulled muscle last week. Was placed on diclofenac and flexeril. Also states he is on a fluid pill. Took diclofenac BID. Stopped around Monday. Has been stressed at work.   Symptoms are similar to his symptoms when he was initially diagnosed except pain was on the right rather than left. Prior to Monday, he was 2-5 soft, firmed BMs daily, which is his baseline. Had no abdominal pain or rectal bleeding. Several years  since last flare.    No fever or chills. No recent antibiotics. No well water. No sick contacts. No recent travel. No undercooked meats. States he probably ate out over the weekend, but nothing unusual.   No lightheadedness, dizziness, pre-syncope, or syncope. No CP or palpitations. No SOB or cough.   Past Medical History:  Diagnosis Date  . Anal stenosis   . Arthritis   . Colitis   . Crohn's disease ([place]) 1993  . H/O hiatal hernia   . History of kidney stones   . Hypertension   . Irritable bowel syndrome   . Pneumonia    hx  . Rectal bleeding   . Unspecified constipation     Past Surgical History:  Procedure Laterality Date  . ABDOMINAL HERNIA REPAIR  2010  . BACK SURGERY    . COLONOSCOPY  05/2004   Dr. Amedeo Plenty- [facility]- crohns   . COLONOSCOPY  08/20/2008   Dr. Gala Romney- normal distal rectum, proximal rectum, scattered 1-2 mm aphthous ulcers, these extended into the distal sigmoid.  Marland Kitchen COLONOSCOPY N/A 10/03/2014   [name]: findings c/w mildly active ileocolonic crohn's with scattered aphthous ulcers in the distal sigmoid colon, descending colon, ascending colon, terminal ileum. Transverse colon was normal.  . COLONOSCOPY WITH PROPOFOL N/A 05/25/2017   Procedure: COLONOSCOPY WITH PROPOFOL;  Surgeon: Daneil Dolin, MD;  scattered ulcers and erythema involving the rectum colon and terminal ileum consistent with Crohn's disease, one benign 4 mm sessile polyp, s/p frequent  segmental biopsies.  Pathology with chronic mildly active ileitis and mildly active colitis in ascending colon, sigmoid colon, and rectum.  Repeat in 2 years.  Marland Kitchen KNEE SURGERY Right 2010   Left Arthoscopic  . NECK SURGERY    . NECK SURGERY N/A 2005   metal plate in neck  . POLYPECTOMY  05/25/2017   Procedure: POLYPECTOMY;  Surgeon: Daneil Dolin, MD;  [facility];  Service: Endoscopy;;  Sigmoid colon ([facility])  . TOTAL KNEE ARTHROPLASTY Left 02/22/2013   Procedure: LEFT TOTAL KNEE ARTHROPLASTY;  Surgeon: Yvette Rack., MD;  Location: [facility];  Service: Orthopedics;  Laterality: Left;    Current Outpatient Medications  Medication Sig Dispense Refill  . lisinopril (PRINIVIL,ZESTRIL) 10 MG tablet Take 10 mg by mouth daily.   0  . mesalamine (LIALDA) 1.2 g EC tablet TAKE 4 (FOUR) TABLETS by mouth every evening. (Patient taking differently: Take 4.8 g by mouth daily after supper. ) 120 tablet 11  . oxyCODONE-acetaminophen (PERCOCET) 10-325 MG tablet Take 1 tablet by mouth as needed for pain.     . tamsulosin (FLOMAX) 0.4 MG CAPS capsule Take 0.4 mg by mouth daily after supper.  10  . triamcinolone cream (KENALOG) 0.5 % Apply 1 application topically 2 (two) times daily as needed (for skin irritation).   0  . predniSONE (DELTASONE) 10 MG tablet Take 4 tablets (40 mg total) by mouth daily x7 days, then take 3 tablets (30 mg total) by mouth daily x 7 days, then take 2 tablets (20 mg total) by mouth daily x 7 days, then take 1 tablet (10 mg total) by mouth daily x 7 days, then stop. 70 tablet 0   No current facility-administered medications for this visit.    Allergies as of 09/19/2019  . (No Known Allergies)    Family History  Problem Relation Age of Onset  . Diabetes Mother   . Heart failure Mother   . Heart failure Father   . Colon polyps Father   . Cancer - Lung Father   . COPD Sister   . Diabetes type II Sister   . Hypertension Sister   . Sleep apnea Sister   . Arthritis Sister   . Irregular heart beat Brother   . Alcoholism Brother   . Arthritis Brother   . Colon cancer Neg Hx     Social History   Socioeconomic History  . Marital status: Married    Spouse name: Not on file  . Number of children: Not on file  . Years of education: Not on file  . Highest education level: Not on file  Occupational History  . Not on file  Tobacco Use  . Smoking status: Former Smoker    Packs/day: 2.00    Years: 10.00    Pack years: 20.00    Types: Cigarettes    Quit date: 02/13/1987    Years  since quitting: 32.6  . Smokeless tobacco: Never Used  Vaping Use  . Vaping Use: Never used  Substance and Sexual Activity  . Alcohol use: No  . Drug use: No  . Sexual activity: Yes    Birth control/protection: None  Other Topics Concern  . Not on file  Social History Narrative  . Not on file   Social Determinants of Health   Financial Resource Strain:   . Difficulty of Paying Living Expenses:   Food Insecurity:   . Worried About Charity fundraiser in the Last Year:   .  Ran Out of Food in the Last Year:   Transportation Needs:   . Film/video editor (Medical):   Marland Kitchen Lack of Transportation (Non-Medical):   Physical Activity:   . Days of Exercise per Week:   . Minutes of Exercise per Session:   Stress:   . Feeling of Stress :   Social Connections:   . Frequency of Communication with Friends and Family:   . Frequency of Social Gatherings with Friends and Family:   . Attends Religious Services:   . Active Member of Clubs or Organizations:   . Attends Archivist Meetings:   Marland Kitchen Marital Status:     Review of Systems: Gen: See HPI CV: See HPI Resp: See HPI GI: See HPI Derm: Denies rash Psych: Denies depression, anxiety Heme: See HPI  Physical Exam: BP 122/72   Pulse 64   Temp (!) 97.3 F (36.3 C) (Temporal)   Ht 6' (1.829 m)   Wt (!) 300 lb 6.4 oz (136.3 kg)   BMI 40.74 kg/m  General:   Alert and oriented. No distress noted. Pleasant and cooperative.  Head:  Normocephalic and atraumatic. Eyes:  Conjuctiva clear without scleral icterus. Heart:  S1, S2 present without murmurs appreciated. Lungs:  Clear to auscultation bilaterally. No wheezes, rales, or rhonchi. No distress.  Abdomen:  +BS, soft, and non-distended. Moderate TTP in LLQ. No rebound or guarding. No HSM or masses noted. Msk:  Symmetrical without gross deformities. Normal posture. Extremities:  Without edema. Neurologic:  Alert and  oriented x4 Psych:  Normal mood and affect.

## 2019-09-19 ENCOUNTER — Other Ambulatory Visit: Payer: Self-pay

## 2019-09-19 ENCOUNTER — Encounter: Payer: Self-pay | Admitting: Gastroenterology

## 2019-09-19 ENCOUNTER — Ambulatory Visit (HOSPITAL_COMMUNITY)
Admission: RE | Admit: 2019-09-19 | Discharge: 2019-09-19 | Disposition: A | Discharge status: Home / Self Care | Payer: Commercial Managed Care - PPO | Source: Ambulatory Visit | Attending: Gastroenterology | Admitting: Gastroenterology

## 2019-09-19 ENCOUNTER — Other Ambulatory Visit (HOSPITAL_COMMUNITY)
Admission: RE | Admit: 2019-09-19 | Discharge: 2019-09-19 | Disposition: A | Discharge status: Home / Self Care | Payer: Commercial Managed Care - PPO | Source: Ambulatory Visit | Attending: Gastroenterology | Admitting: Gastroenterology

## 2019-09-19 ENCOUNTER — Telehealth: Payer: Self-pay | Admitting: *Deleted

## 2019-09-19 ENCOUNTER — Ambulatory Visit: Payer: Commercial Managed Care - PPO | Admitting: Gastroenterology

## 2019-09-19 VITALS — BP 122/72 | HR 64 | Temp 97.3°F | Ht 72.0 in | Wt 300.4 lb

## 2019-09-19 DIAGNOSIS — R197 Diarrhea, unspecified: Secondary | ICD-10-CM | POA: Insufficient documentation

## 2019-09-19 DIAGNOSIS — K50819 Crohn's disease of both small and large intestine with unspecified complications: Secondary | ICD-10-CM | POA: Diagnosis not present

## 2019-09-19 DIAGNOSIS — R1032 Left lower quadrant pain: Secondary | ICD-10-CM | POA: Diagnosis not present

## 2019-09-19 DIAGNOSIS — K625 Hemorrhage of anus and rectum: Secondary | ICD-10-CM | POA: Diagnosis not present

## 2019-09-19 LAB — CBC WITH DIFFERENTIAL/PLATELET
Abs Immature Granulocytes: 0.01 10*3/uL (ref 0.00–0.07)
Basophils Absolute: 0 10*3/uL (ref 0.0–0.1)
Basophils Relative: 1 %
Eosinophils Absolute: 0.1 10*3/uL (ref 0.0–0.5)
Eosinophils Relative: 2 %
HCT: 43.9 % (ref 39.0–52.0)
Hemoglobin: 14.5 g/dL (ref 13.0–17.0)
Immature Granulocytes: 0 %
Lymphocytes Relative: 23 %
Lymphs Abs: 1.4 10*3/uL (ref 0.7–4.0)
MCH: 29.9 pg (ref 26.0–34.0)
MCHC: 33 g/dL (ref 30.0–36.0)
MCV: 90.5 fL (ref 80.0–100.0)
Monocytes Absolute: 0.6 10*3/uL (ref 0.1–1.0)
Monocytes Relative: 10 %
Neutro Abs: 3.9 10*3/uL (ref 1.7–7.7)
Neutrophils Relative %: 64 %
Platelets: 264 10*3/uL (ref 150–400)
RBC: 4.85 MIL/uL (ref 4.22–5.81)
RDW: 12.6 % (ref 11.5–15.5)
WBC: 6 10*3/uL (ref 4.0–10.5)
nRBC: 0 % (ref 0.0–0.2)

## 2019-09-19 LAB — BASIC METABOLIC PANEL
Anion gap: 7 (ref 5–15)
BUN: 16 mg/dL (ref 6–20)
CO2: 28 mmol/L (ref 22–32)
Calcium: 8.8 mg/dL — ABNORMAL LOW (ref 8.9–10.3)
Chloride: 104 mmol/L (ref 98–111)
Creatinine, Ser: 0.87 mg/dL (ref 0.61–1.24)
GFR calc Af Amer: >60 mL/min (ref >60)
GFR calc non Af Amer: >60 mL/min (ref >60)
Glucose, Bld: 120 mg/dL — ABNORMAL HIGH (ref 70–99)
Potassium: 4 mmol/L (ref 3.5–5.1)
Sodium: 139 mmol/L (ref 135–145)

## 2019-09-19 LAB — C DIFFICILE QUICK SCREEN W PCR REFLEX
C Diff antigen: NEGATIVE
C Diff interpretation: NOT DETECTED
C Diff toxin: NEGATIVE

## 2019-09-19 LAB — C-REACTIVE PROTEIN: CRP: 1.5 mg/dL — ABNORMAL HIGH (ref <1.0)

## 2019-09-19 LAB — SEDIMENTATION RATE: Sed Rate: 15 mm/hr (ref 0–16)

## 2019-09-19 MED ORDER — IOHEXOL 9 MG/ML PO SOLN
ORAL | Status: AC
  Filled 2019-09-19: qty 1000

## 2019-09-19 MED ORDER — IOHEXOL 300 MG/ML  SOLN
100.0000 mL | Freq: Once | INTRAMUSCULAR | Status: AC | PRN
  Administered 2019-09-19: 100 mL via INTRAVENOUS

## 2019-09-19 NOTE — Telephone Encounter (Signed)
Called [facility] and spoke with Rem. No PA is required. Ref# [id number]

## 2019-09-19 NOTE — Patient Instructions (Addendum)
Please go to have your CT and labs completed that he can hospital today.  I have also ordered stool studies.  You should be able to pick up the kit from any panel lab.  Should you have any trouble, please let me know.  Continue taking Lialda 4.8 g daily.  Should you develop profuse rectal bleeding, feel lightheaded, dizzy, like you will pass out, or persistent severe abdominal pain, you should proceed to the emergency room.  I will call you with further recommendations once I have your results.  Aliene Altes, PA-C [facility]

## 2019-09-20 ENCOUNTER — Other Ambulatory Visit: Payer: Self-pay | Admitting: Gastroenterology

## 2019-09-20 ENCOUNTER — Telehealth: Payer: Self-pay | Admitting: Internal Medicine

## 2019-09-20 ENCOUNTER — Telehealth: Payer: Self-pay | Admitting: Emergency Medicine

## 2019-09-20 DIAGNOSIS — K625 Hemorrhage of anus and rectum: Secondary | ICD-10-CM

## 2019-09-20 DIAGNOSIS — R197 Diarrhea, unspecified: Secondary | ICD-10-CM

## 2019-09-20 DIAGNOSIS — K50819 Crohn's disease of both small and large intestine with unspecified complications: Secondary | ICD-10-CM

## 2019-09-20 LAB — GASTROINTESTINAL PANEL BY PCR, STOOL (REPLACES STOOL CULTURE)

## 2019-09-20 MED ORDER — PREDNISONE 10 MG PO TABS: ORAL_TABLET | ORAL | 0 refills | Status: DC

## 2019-09-20 NOTE — Telephone Encounter (Signed)
Pt was returning a call from [facility] regarding his results. [phone]

## 2019-09-20 NOTE — Telephone Encounter (Signed)
Pt called and stated provider tried calling him about lab results this morning. He is returning their call

## 2019-09-21 ENCOUNTER — Encounter: Payer: Self-pay | Admitting: Gastroenterology

## 2019-09-21 NOTE — Assessment & Plan Note (Addendum)
59 year old male with history of ileocolonic Crohn's disease diagnosed in 1993 that has been clinically well controlled on Lialda 4.8 g daily for several years.  Last colonoscopy in February 2019 with scattered ulcers and erythema involving the rectum, colon, and terminal ileum with biopsies revealing mildly active ileitis and mildly active colitis in ascending colon, sigmoid, and rectum.  He was continued on Lialda with recommendations to repeat colonoscopy in 2 years.  Unfortunately, patient has developed acute onset of left lower quadrant pain, diarrhea, and rectal bleeding 3 days ago.  Describes the blood as "little masses in the toilet."  Stool quantity is little.  Feels he needs to have a BM but sometimes cannot.  Had been taking diclofenac last week for a pulled muscle and also notes increased stress due to work.  No recent antibiotics, well water, sick contacts, travel, or consumption of undercooked meats.  Last Crohn's flare was several years ago.  Suspect patient is likely dealing with a Crohn's flare.  However, will need to rule out infectious diarrhea/colitis prior to starting him on prednisone.  CT A/P with IV contrast stat CBC, BMP, sed rate, CRP, C. difficile, and GI pathogen panel Continue Lialda 4.8 g daily. Advised to proceed to the emergency department should he develop profuse rectal bleeding, feel lightheaded, dizzy, like he will pass out, or have persistent severe abdominal pain. Further recommendations to follow. Notably, he is due for repeat colonoscopy. We will need to arrange this once his acute symptoms resolve.

## 2019-09-21 NOTE — Assessment & Plan Note (Signed)
Addressed and rectal bleeding

## 2019-09-21 NOTE — Assessment & Plan Note (Signed)
Addressed under rectal bleeding

## 2019-09-22 NOTE — Telephone Encounter (Signed)
See result note from labs dated 09/19/19

## 2019-09-23 NOTE — Progress Notes (Signed)
Spoke with Mr. Bazinet this morning. He is feeling better. Abdominal pain has resolved. Rectal bleeding has essentially resolved and he was able to have a normal BM this morning. He will call back Friday morning with a progress report to ensure he is still feeling well before the weekend.   Erline Levine, can you arrange for a follow-up appointment in 2 weeks for Crohn's flare?  Candace Cruise

## 2019-09-23 NOTE — Progress Notes (Signed)
CC'ED TO PCP 

## 2019-09-24 ENCOUNTER — Encounter: Payer: Self-pay | Admitting: Internal Medicine

## 2019-10-16 NOTE — Progress Notes (Signed)
Referring Provider: Scotty Court, DO Primary Care Physician:  Adaline Sill, NP Primary GI Physician: Dr. Gala Romney  Chief Complaint  Patient presents with   Crohn's Disease    doing okay now    HPI:   Ricky Blackwell is a 59 y.o. male with a history of  ileocolonic Crohn's disease, diagnosed in 59. Also with history of anal stenosis previously seen by Dr. Morton Stall at [facility]. Has been maintained on Lialda 4.8g daily chronically.  Last colonoscopy Feb 2019 with scattered ulcers and erythema involving the rectum colon and terminal ileum consistent with Crohn's disease, one benign 4 mm sessile polyp, s/p frequent segmental biopsies.  Pathology with chronic mildly active ileitis and mildly active colitis in ascending colon, sigmoid colon, and rectum. Polyp benign.  Recommended continuing Lialda and repeat colonoscopy in 2 years.  He presents today for follow-up.  Last seen in our office 09/19/2019 for Crohn's flare.  Reported 3-4-day history of frequent bloody bowel movements with left-sided abdominal pain and nausea without vomiting.  Notably, he had pulled a muscle the week prior and was placed on diclofenac twice daily and Flexeril.  Also noted increased stress at work.  Prior to flare, he was having 2-5 soft, formed BMs daily which was his baseline.  No abdominal pain or rectal bleeding.  It has been several years since his last flare.  Plan for CT A/P with IV contrast, CBC, BMP, sed rate, CRP, C. difficile, GI pathogen panel, continue Lialda 4.8 g daily, further recommendations after labs completed.  CT A/P 09/19/2019: No evidence of active inflammatory bowel disease, small perirectal lymph nodes favored reactive and could be suggestive of occult inflammation, no stricturing of small bowel. Labs: C. difficile and GI pathogen panel negative, hemoglobin and electrolytes within normal limits, CRP slightly elevated at 1.5, sed rate within normal limits.  Patient was started on prednisone 40  mg daily x1 week on 09/20/2019 with plans for taper to 30 mg x 1 week, then 20 mg in 1 week, then 10 mg x 1 week, then stop.  Spoke with patient 09/23/2019.  He was feeling better.  Abdominal pain resolved.  Rectal bleeding essentially resolved.  Was able to have a normal BM. Progress report 10/10/2019 with patient doing much better.  No complaints.  Today: Has one day left of prednisone. No diarrhea. Having 4-6 BMs daily which is his baseline for years. Stools are formed. No blood in the stool or black stool. No abdominal pain.   Takes Percocet once every 2 weeks. Also has flexeril to take with percocet. Not sure of dosage.  No NSAIDs. Not a smoker. No alcohol.   Also takes a fluid pill every day.   No upper GI concerns.  Denies GERD symptoms, dysphagia, nausea, or vomiting.  No fever or chills. No pre-syncope or syncope. Admits to intermittent central chest pain lasting a minute and resolves on its own. Typically associated with stress at work. No association with exertion. No palpitations or associated shortness of breath with chest pain.  He does have mild SOB with exertion. No SOB at rest.   Past Medical History:  Diagnosis Date   Anal stenosis    Arthritis    Colitis    Crohn's disease ([place]) 1993   H/O hiatal hernia    History of kidney stones    Hypertension    Irritable bowel syndrome    Pneumonia    hx   Rectal bleeding    Unspecified constipation  Past Surgical History:  Procedure Laterality Date   ABDOMINAL HERNIA REPAIR  2010   [place]  2000   COLONOSCOPY  05/2004   Dr. Amedeo Plenty- [facility]- crohns    COLONOSCOPY  08/20/2008   Dr. Gala Romney- normal distal rectum, proximal rectum, scattered 1-2 mm aphthous ulcers, these extended into the distal sigmoid.   COLONOSCOPY N/A 10/03/2014   [name]: findings c/w mildly active ileocolonic crohn's with scattered aphthous ulcers in the distal sigmoid colon, descending colon, ascending colon, terminal ileum. Transverse  colon was normal.   COLONOSCOPY WITH PROPOFOL N/A 05/25/2017   Procedure: COLONOSCOPY WITH PROPOFOL;  Surgeon: Daneil Dolin, MD;  scattered ulcers and erythema involving the rectum colon and terminal ileum consistent with Crohn's disease, one benign 4 mm sessile polyp, s/p frequent segmental biopsies.  Pathology with chronic mildly active ileitis and mildly active colitis in ascending colon, sigmoid colon, and rectum.  Repeat in 2 years.   KNEE SURGERY Right 2010   Left Arthoscopic   NECK SURGERY     NECK SURGERY N/A 2005   metal plate in neck   POLYPECTOMY  05/25/2017   Procedure: POLYPECTOMY;  Surgeon: Daneil Dolin, MD;  [facility];  Service: Endoscopy;;  Sigmoid colon (CS)   TOTAL KNEE ARTHROPLASTY Left 02/22/2013   Procedure: LEFT TOTAL KNEE ARTHROPLASTY;  Surgeon: Yvette Rack., MD;  Location: [facility];  Service: Orthopedics;  Laterality: Left;    Current Outpatient Medications  Medication Sig Dispense Refill   lisinopril (PRINIVIL,ZESTRIL) 10 MG tablet Take 10 mg by mouth daily.   0   mesalamine (LIALDA) 1.2 g EC tablet TAKE 4 (FOUR) TABLETS by mouth every evening. (Patient taking differently: Take 4.8 g by mouth daily after supper. ) 120 tablet 11   oxyCODONE-acetaminophen (PERCOCET) 10-325 MG tablet Take 1 tablet by mouth as needed for pain.      predniSONE (DELTASONE) 10 MG tablet Take 4 tablets (40 mg total) by mouth daily x7 days, then take 3 tablets (30 mg total) by mouth daily x 7 days, then take 2 tablets (20 mg total) by mouth daily x 7 days, then take 1 tablet (10 mg total) by mouth daily x 7 days, then stop. 70 tablet 0   tamsulosin (FLOMAX) 0.4 MG CAPS capsule Take 0.4 mg by mouth daily after supper.  10   triamcinolone cream (KENALOG) 0.5 % Apply 1 application topically 2 (two) times daily as needed (for skin irritation).   0   No current facility-administered medications for this visit.    Allergies as of 10/17/2019   (No Known Allergies)     Family History  Problem Relation Age of Onset   Diabetes Mother    Heart failure Mother    Heart failure Father    Colon polyps Father    Cancer - Lung Father    COPD Sister    Diabetes type II Sister    Hypertension Sister    Sleep apnea Sister    Arthritis Sister    Colitis Sister        Maybe?   Irregular heart beat Brother    Alcoholism Brother    Arthritis Brother    Colon cancer Neg Hx     Social History   Socioeconomic History   Marital status: Married    Spouse name: Not on file   Number of children: Not on file   Years of education: Not on file   Highest education level: Not on file  Occupational History   Not on file  Tobacco Use   Smoking status: Former Smoker    Packs/day: 2.00    Years: 10.00    Pack years: 20.00    Types: Cigarettes    Quit date: 02/13/1987    Years since quitting: 32.6   Smokeless tobacco: Never Used  Vaping Use   Vaping Use: Never used  Substance and Sexual Activity   Alcohol use: No   Drug use: No   Sexual activity: Yes    Birth control/protection: None  Other Topics Concern   Not on file  Social History Narrative   Not on file   Social Determinants of Health   Financial Resource Strain:    Difficulty of Paying Living Expenses:   Food Insecurity:    Worried About Charity fundraiser in the Last Year:    Arboriculturist in the Last Year:   Transportation Needs:    Film/video editor (Medical):    Lack of Transportation (Non-Medical):   Physical Activity:    Days of Exercise per Week:    Minutes of Exercise per Session:   Stress:    Feeling of Stress :   Social Connections:    Frequency of Communication with Friends and Family:    Frequency of Social Gatherings with Friends and Family:    Attends Religious Services:    Active Member of Clubs or Organizations:    Attends Music therapist:    Marital Status:     Review of Systems: Gen: See HPI CV:  See HPI Resp: See HPI GI: See HPI  Heme: See HPI  Physical Exam: BP (!) 143/74    Pulse 62    Temp (!) 97.1 F (36.2 C)    Ht 6' (1.829 m)    Wt (!) 308 lb 3.2 oz (139.8 kg)    BMI 41.80 kg/m  General:   Alert and oriented. No distress noted. Pleasant and cooperative.  Head:  Normocephalic and atraumatic. Eyes:  Conjuctiva clear without scleral icterus. Heart:  S1, S2 present without murmurs appreciated. Lungs:  Clear to auscultation bilaterally. No wheezes, rales, or rhonchi. No distress.  Abdomen:  +BS, soft, non-tender and non-distended. No rebound or guarding. No HSM or masses noted. Msk:  Symmetrical without gross deformities. Normal posture. Extremities:  With minimal lower extremity edema. Neurologic:  Alert and  oriented x4 Psych:  Normal mood and affect.

## 2019-10-17 ENCOUNTER — Telehealth: Payer: Self-pay

## 2019-10-17 ENCOUNTER — Other Ambulatory Visit: Payer: Self-pay

## 2019-10-17 ENCOUNTER — Encounter: Payer: Self-pay | Admitting: Gastroenterology

## 2019-10-17 ENCOUNTER — Ambulatory Visit: Payer: Commercial Managed Care - PPO | Admitting: Gastroenterology

## 2019-10-17 VITALS — BP 143/74 | HR 62 | Temp 97.1°F | Ht 72.0 in | Wt 308.2 lb

## 2019-10-17 DIAGNOSIS — R0789 Other chest pain: Secondary | ICD-10-CM | POA: Insufficient documentation

## 2019-10-17 DIAGNOSIS — K5080 Crohn's disease of both small and large intestine without complications: Secondary | ICD-10-CM

## 2019-10-17 DIAGNOSIS — R079 Chest pain, unspecified: Secondary | ICD-10-CM | POA: Insufficient documentation

## 2019-10-17 NOTE — Assessment & Plan Note (Signed)
Patient reports 5 to 32-monthhistory of intermittent centralized chest pain associated with stress lasting 1 minute at the most and resolving on its own.  No association with exertion.  Denies heart palpitations or associated shortness of breath. No GERD symptoms.  No prior cardiac history.  Suspect symptoms are likely benign and related to stress; however, cannot rule out underlying cardiac etiology.  I recommended patient follow-up with PCP on this.  ED precautions given.

## 2019-10-17 NOTE — Telephone Encounter (Signed)
Pt called back to let [facility] know that he is taking Hydrochlorothiazide 12.5 mg daily and Flexeril 10 mg. Medications have been added to pts list.

## 2019-10-17 NOTE — Assessment & Plan Note (Addendum)
59 year old male with history of ileocolonic Crohn's disease diagnosed in 1993 that had been clinically well controlled on Lialda 4.8 g daily for several years.  Unfortunately, patient had a flare in June 2021 with frequent bloody BMs and left lower quadrant pain.  Notably, symptoms began after taking diclofenac for 1 week for a pulled muscle. CT A/P on 6/10 with no evidence of active inflammatory bowel disease, stool studies negative, CRP slightly elevated at 1.5, sed rate within normal limits.  He was treated with prednisone 40 mg x 1 week followed by 3 week taper and will complete last dose of prednisone tomorrow.  Clinically, patient has achieved remission with normalization of bowel movements, no rectal bleeding, and no abdominal pain.  No further NSAID use.  Not a smoker. Last Crohn's flare prior to this was several years ago.  Last colonoscopy in February 2019 with scattered ulcers and erythema involving the rectum, colon, and terminal ileum with biopsies revealing mildly active ileitis and mildly active colitis in ascending colon, sigmoid, and rectum.  He was continued on Lialda with recommendations to repeat colonoscopy in 2 years.  Suspect Crohn's flare may have been triggered by NSAIDs.  As this was his first flare in several years, and he has achieved clinical remission, will plan to monitor for return of symptoms over the next 6 months with repeat colonoscopy in 6 months to evaluate histologic remission. Would need to advance therapy if he has relapse. May also need to consider advancing therapy if he were to continue with active colitis on colonoscopy.   Complete prednisone prescription tomorrow. Continue Lialda 4 tablets (4.8 g) daily. Continue to avoid all NSAIDs. Monitor for any return of rectal bleeding, abdominal pain, or diarrhea month as no immediately. Plan to follow-up in December to discuss scheduling colonoscopy to be completed in January 2022.

## 2019-10-17 NOTE — Patient Instructions (Addendum)
Please call to let us know what dose of Flexeril you are taking and what fluid pill you are taking.    Please follow-up with PCP regarding intermittent chest pain. If you have any persistent chest pain, you need to proceed to the ED.   You should be finishing prednisone tomorrow.   Continue Lialda 4 tablets (4.8 g total) daily.   Avoid all NSAID products including ibuprofen, Advil, Aleve, Naproxen, aspirin powders, diclofenac and anything that says "NSAID" on the package.   Monitor for any return of abdominal pain, rectal bleeding, or diarrhea and let me know immediately.   You are going to need a colonoscopy in January. We will see you back in December this year to discuss getting colonoscopy scheduled.   Aliene Altes, PA-C [facility]

## 2019-10-17 NOTE — Telephone Encounter (Signed)
Noted. Thank you for updating medication list.

## 2020-01-17 ENCOUNTER — Other Ambulatory Visit: Payer: Self-pay | Admitting: Nurse Practitioner

## 2020-02-24 ENCOUNTER — Emergency Department (HOSPITAL_COMMUNITY): Payer: Commercial Managed Care - PPO

## 2020-02-24 ENCOUNTER — Other Ambulatory Visit: Payer: Self-pay

## 2020-02-24 ENCOUNTER — Encounter (HOSPITAL_COMMUNITY): Payer: Self-pay

## 2020-02-24 ENCOUNTER — Emergency Department (HOSPITAL_COMMUNITY)
Admission: EM | Admit: 2020-02-24 | Discharge: 2020-02-24 | Disposition: A | Discharge status: Home / Self Care | Payer: Commercial Managed Care - PPO | Attending: Emergency Medicine | Admitting: Emergency Medicine

## 2020-02-24 DIAGNOSIS — L02415 Cutaneous abscess of right lower limb: Secondary | ICD-10-CM | POA: Diagnosis not present

## 2020-02-24 DIAGNOSIS — Z87891 Personal history of nicotine dependence: Secondary | ICD-10-CM | POA: Diagnosis not present

## 2020-02-24 DIAGNOSIS — I10 Essential (primary) hypertension: Secondary | ICD-10-CM | POA: Diagnosis not present

## 2020-02-24 LAB — BASIC METABOLIC PANEL
Anion gap: 9 (ref 5–15)
BUN: 17 mg/dL (ref 6–20)
CO2: 27 mmol/L (ref 22–32)
Calcium: 9.3 mg/dL (ref 8.9–10.3)
Chloride: 100 mmol/L (ref 98–111)
Creatinine, Ser: 1.05 mg/dL (ref 0.61–1.24)
GFR, Estimated: >60 mL/min (ref >60)
Glucose, Bld: 97 mg/dL (ref 70–99)
Potassium: 4.1 mmol/L (ref 3.5–5.1)
Sodium: 136 mmol/L (ref 135–145)

## 2020-02-24 LAB — CBC WITH DIFFERENTIAL/PLATELET
Abs Immature Granulocytes: 0.02 10*3/uL (ref 0.00–0.07)
Basophils Absolute: 0 10*3/uL (ref 0.0–0.1)
Basophils Relative: 0 %
Eosinophils Absolute: 0.2 10*3/uL (ref 0.0–0.5)
Eosinophils Relative: 3 %
HCT: 43.1 % (ref 39.0–52.0)
Hemoglobin: 13.9 g/dL (ref 13.0–17.0)
Immature Granulocytes: 0 %
Lymphocytes Relative: 24 %
Lymphs Abs: 1.7 10*3/uL (ref 0.7–4.0)
MCH: 30.5 pg (ref 26.0–34.0)
MCHC: 32.3 g/dL (ref 30.0–36.0)
MCV: 94.7 fL (ref 80.0–100.0)
Monocytes Absolute: 0.7 10*3/uL (ref 0.1–1.0)
Monocytes Relative: 10 %
Neutro Abs: 4.4 10*3/uL (ref 1.7–7.7)
Neutrophils Relative %: 63 %
Platelets: 293 10*3/uL (ref 150–400)
RBC: 4.55 MIL/uL (ref 4.22–5.81)
RDW: 12.1 % (ref 11.5–15.5)
WBC: 6.9 10*3/uL (ref 4.0–10.5)
nRBC: 0 % (ref 0.0–0.2)

## 2020-02-24 MED ORDER — SODIUM CHLORIDE 0.9 % IV SOLN
1.0000 g | Freq: Once | INTRAVENOUS | Status: AC
  Administered 2020-02-24: 1 g via INTRAVENOUS
  Filled 2020-02-24: qty 10

## 2020-02-24 MED ORDER — DOXYCYCLINE HYCLATE 100 MG PO CAPS: 100.0000 mg | ORAL_CAPSULE | Freq: Two times a day (BID) | ORAL | 0 refills | Status: DC

## 2020-02-24 NOTE — Discharge Instructions (Signed)
Return if any problems.

## 2020-02-24 NOTE — ED Provider Notes (Signed)
[facility] EMERGENCY DEPARTMENT Provider Note   CSN: [id number] Arrival date & time: 02/24/20  1042     History Chief Complaint  Patient presents with  . Abscess    Ricky Blackwell is a 59 y.o. male.  The history is provided by the patient. No language interpreter was used.  Abscess Location:  Leg Leg abscess location:  R knee Size:  8 Abscess quality: draining, redness and warmth   Red streaking: no   Duration:  1 week Progression:  Improving Chronicity:  New Context: not diabetes   Relieved by:  Nothing Worsened by:  Nothing Ineffective treatments:  None tried Pt reports he is on antibiotics.  Pt reports area of redness has decreased but area is now draining.  Pt has a culture that was done that grew MRSA.     Past Medical History:  Diagnosis Date  . Anal stenosis   . Arthritis   . Colitis   . Crohn's disease ([place]) 1993  . H/O hiatal hernia   . History of kidney stones   . Hypertension   . Irritable bowel syndrome   . Pneumonia    hx  . Rectal bleeding   . Unspecified constipation     Patient Active Problem List   Diagnosis Date Noted  . Chest pain 10/17/2019  . LLQ pain 09/19/2019  . Diarrhea 09/19/2019  . Crohn's disease of both small and large intestine without complication ([place]) 87/86/7672  . Mucosal abnormality of colon   . Crohn's colitis ([place]) 09/09/2014  . Crohn's disease ([place]) 06/26/2008  . COLITIS 06/26/2008  . UNSPECIFIED CONSTIPATION 06/26/2008  . ANAL STENOSIS 06/26/2008  . RECTAL BLEEDING 06/26/2008  . IRRITABLE BOWEL SYNDROME, HX OF 06/26/2008    Past Surgical History:  Procedure Laterality Date  . ABDOMINAL HERNIA REPAIR  2010  . BACK SURGERY  2000  . COLONOSCOPY  05/2004   Dr. Amedeo Plenty- [facility]- crohns   . COLONOSCOPY  08/20/2008   Dr. Gala Romney- normal distal rectum, proximal rectum, scattered 1-2 mm aphthous ulcers, these extended into the distal sigmoid.  Marland Kitchen COLONOSCOPY N/A 10/03/2014   [name]: findings c/w mildly active  ileocolonic crohn's with scattered aphthous ulcers in the distal sigmoid colon, descending colon, ascending colon, terminal ileum. Transverse colon was normal.  . COLONOSCOPY WITH PROPOFOL N/A 05/25/2017   Procedure: COLONOSCOPY WITH PROPOFOL;  Surgeon: Daneil Dolin, MD;  scattered ulcers and erythema involving the rectum colon and terminal ileum consistent with Crohn's disease, one benign 4 mm sessile polyp, s/p frequent segmental biopsies.  Pathology with chronic mildly active ileitis and mildly active colitis in ascending colon, sigmoid colon, and rectum.  Repeat in 2 years.  Marland Kitchen KNEE SURGERY Right 2010   Left Arthoscopic  . NECK SURGERY    . NECK SURGERY N/A 2005   metal plate in neck  . POLYPECTOMY  05/25/2017   Procedure: POLYPECTOMY;  Surgeon: Daneil Dolin, MD;  [facility];  Service: Endoscopy;;  Sigmoid colon (CS)  . TOTAL KNEE ARTHROPLASTY Left 02/22/2013   Procedure: LEFT TOTAL KNEE ARTHROPLASTY;  Surgeon: Yvette Rack., MD;  Location: [place];  Service: Orthopedics;  Laterality: Left;       Family History  Problem Relation Age of Onset  . Diabetes Mother   . Heart failure Mother   . Heart failure Father   . Colon polyps Father   . Cancer - Lung Father   . COPD Sister   . Diabetes type II Sister   .  Hypertension Sister   . Sleep apnea Sister   . Arthritis Sister   . Colitis Sister        Maybe?  . Irregular heart beat Brother   . Alcoholism Brother   . Arthritis Brother   . Colon cancer Neg Hx     Social History   Tobacco Use  . Smoking status: Former Smoker    Packs/day: 2.00    Years: 10.00    Pack years: 20.00    Types: Cigarettes    Quit date: 02/13/1987    Years since quitting: 33.0  . Smokeless tobacco: Never Used  Vaping Use  . Vaping Use: Never used  Substance Use Topics  . Alcohol use: No  . Drug use: No    Home Medications Prior to Admission medications   Medication Sig Start Date End Date Taking? Authorizing Provider    cyclobenzaprine (FLEXERIL) 10 MG tablet Take 10 mg by mouth 3 (three) times daily as needed for muscle spasms.    [provider]  hydrochlorothiazide (MICROZIDE) 12.5 MG capsule Take 12.5 mg by mouth daily.    [provider]  lisinopril (PRINIVIL,ZESTRIL) 10 MG tablet Take 10 mg by mouth daily.  03/23/17   [provider]  mesalamine (LIALDA) 1.2 g EC tablet TAKE 4 (FOUR) TABLETS EVERY MORNING 01/20/20   Carlis Stable, NP  oxyCODONE-acetaminophen (PERCOCET) 10-325 MG tablet Take 1 tablet by mouth as needed for pain.     [provider]  predniSONE (DELTASONE) 10 MG tablet Take 4 tablets (40 mg total) by mouth daily x7 days, then take 3 tablets (30 mg total) by mouth daily x 7 days, then take 2 tablets (20 mg total) by mouth daily x 7 days, then take 1 tablet (10 mg total) by mouth daily x 7 days, then stop. 09/20/19   Erenest Rasher, PA-C  tamsulosin (FLOMAX) 0.4 MG CAPS capsule Take 0.4 mg by mouth daily after supper. 03/28/17   [provider]  triamcinolone cream (KENALOG) 0.5 % Apply 1 application topically 2 (two) times daily as needed (for skin irritation).  08/06/14   [provider]    Allergies    Patient has no known allergies.  Review of Systems   Review of Systems  All other systems reviewed and are negative.   Physical Exam Updated Vital Signs BP (!) 98/49 (BP Location: Right Arm)   Pulse 65   Temp 98.1 F (36.7 C) (Oral)   Resp 16   Ht 6' (1.829 m)   Wt (!) 140.6 kg   SpO2 98%   BMI 42.04 kg/m   Physical Exam Vitals and nursing note reviewed.  Constitutional:      Appearance: He is well-developed.  HENT:     Head: Normocephalic and atraumatic.  Eyes:     Conjunctiva/sclera: Conjunctivae normal.  Cardiovascular:     Rate and Rhythm: Normal rate and regular rhythm.     Heart sounds: No murmur heard.   Pulmonary:     Effort: Pulmonary effort is normal. No respiratory distress.     Breath sounds: Normal  breath sounds.  Musculoskeletal:        General: Tenderness present. Normal range of motion.     Cervical back: Neck supple.     Comments: Draining abscess right knee,  Redness decreased from lines marked at urgent care.    Skin:    General: Skin is warm and dry.  Neurological:     General: No focal deficit present.  Mental Status: He is alert.  Psychiatric:        Mood and Affect: Mood normal.     ED Results / Procedures / Treatments   Labs (all labs ordered are listed, but only abnormal results are displayed) Labs Reviewed  CBC WITH DIFFERENTIAL/PLATELET  BASIC METABOLIC PANEL    EKG None  Radiology DG Knee Complete 4 Views Right  Result Date: 02/24/2020 CLINICAL DATA:  Pain with suspected spider bite EXAM: RIGHT KNEE - COMPLETE 4+ VIEW COMPARISON:  None. FINDINGS: Frontal, lateral, and bilateral oblique views were obtained. There is moderate lateral patellar subluxation. There is no fracture or frank dislocation. There is no joint effusion. There is moderate narrowing medially and in the patellofemoral joint region. There is spurring in all compartments. No erosion. 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 No soft tissue air or radiopaque foreign body evident. IMPRESSION: Lateral patellar subluxation. No fracture, dislocation, or joint effusion. There is osteoarthritic change, most notably medially and in the patellofemoral joint region. 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 No soft tissue air or radiopaque foreign body evident. Electronically Signed   By: Lowella Grip III M.D.   On: 02/24/2020 12:44    Procedures Procedures (including critical care time)  Medications Ordered in ED Medications  cefTRIAXone (ROCEPHIN) 1 g in sodium chloride 0.9 % 100 mL IVPB (1 g Intravenous New Bag/Given 02/24/20 1336)    ED Course  I have reviewed the  triage vital signs and the nursing notes.  Pertinent labs & imaging results that were available during my care of the patient were reviewed by me and considered in my medical decision making (see chart for details).    MDM Rules/Calculators/A&P                          MDM:  Xray shows no bone involvement.  Labs normal.  Pt given IV Rocephin.  Pt advised to return here tomorrow for recheck and possible second dose of antibiotics.  I suspect as area is now draining that it will continue to improve.  Final Clinical Impression(s) / ED Diagnoses Final diagnoses:  Abscess of right knee    Rx / DC Orders ED Discharge Orders    None    An After Visit Summary was printed and given to the patient.    Fransico Meadow, PA-C 02/24/20 Cricket, Julie, MD 02/24/20 [phone]

## 2020-02-24 NOTE — ED Triage Notes (Signed)
Pt reports went to urgent care last week for abscess to r knee.  Reports had culture taken and was put on antibiotics.  Reports has been taking his antibiotics and followed up with pcp and was sent here for further evaluation.  Reports culture came back positive for MRSA.

## 2020-02-25 ENCOUNTER — Other Ambulatory Visit: Payer: Self-pay

## 2020-02-25 ENCOUNTER — Encounter (HOSPITAL_COMMUNITY): Payer: Self-pay

## 2020-02-25 ENCOUNTER — Emergency Department (HOSPITAL_COMMUNITY)
Admission: EM | Admit: 2020-02-25 | Discharge: 2020-02-25 | Disposition: A | Discharge status: Home / Self Care | Payer: Commercial Managed Care - PPO | Attending: Emergency Medicine | Admitting: Emergency Medicine

## 2020-02-25 DIAGNOSIS — B9562 Methicillin resistant Staphylococcus aureus infection as the cause of diseases classified elsewhere: Secondary | ICD-10-CM | POA: Insufficient documentation

## 2020-02-25 DIAGNOSIS — L02415 Cutaneous abscess of right lower limb: Secondary | ICD-10-CM | POA: Diagnosis not present

## 2020-02-25 DIAGNOSIS — A4902 Methicillin resistant Staphylococcus aureus infection, unspecified site: Secondary | ICD-10-CM

## 2020-02-25 DIAGNOSIS — L0291 Cutaneous abscess, unspecified: Secondary | ICD-10-CM

## 2020-02-25 DIAGNOSIS — Z96652 Presence of left artificial knee joint: Secondary | ICD-10-CM | POA: Diagnosis not present

## 2020-02-25 DIAGNOSIS — I10 Essential (primary) hypertension: Secondary | ICD-10-CM | POA: Diagnosis not present

## 2020-02-25 LAB — CBG MONITORING, ED: Glucose-Capillary: 106 mg/dL — ABNORMAL HIGH (ref 70–99)

## 2020-02-25 MED ORDER — SODIUM CHLORIDE 0.9 % IV SOLN
1.0000 g | Freq: Once | INTRAVENOUS | Status: AC
  Administered 2020-02-25: 1 g via INTRAVENOUS
  Filled 2020-02-25: qty 10

## 2020-02-25 MED ORDER — MUPIROCIN CALCIUM 2 % EX CREA: 1.0000 "application " | TOPICAL_CREAM | Freq: Two times a day (BID) | CUTANEOUS | 0 refills | Status: DC

## 2020-02-25 MED ORDER — OXYCODONE-ACETAMINOPHEN 5-325 MG PO TABS: 1.0000 | ORAL_TABLET | ORAL | 0 refills | Status: DC | PRN

## 2020-02-25 MED ORDER — MUPIROCIN 2 % EX OINT
TOPICAL_OINTMENT | Freq: Once | CUTANEOUS | Status: AC
  Filled 2020-02-25: qty 22

## 2020-02-25 NOTE — ED Triage Notes (Signed)
Pt here for wound check of right knee. Pt states he is suppose to come for another round of antibiotics.

## 2020-02-25 NOTE — Discharge Instructions (Signed)
Continue taking your doxycycline you were prescribed yesterday. In addition add the mupirocin topical antibiotic as prescribed (if this is not the medicine you were prescribed at the urgent care last week).  Continue warm water soaks twice daily in addition to gentle massage as discussed around the site to encourage continued drainage from the sit. You have been prescribed a small quantity of pain medicine - do not drive within 4 hours of taking this medicine as it will make you drowsy.  As long as this site continues to get smaller, you do not need a recheck, but it starts to worsen in any way, return here or call your primary MD.

## 2020-02-26 LAB — AEROBIC CULTURE  (SUPERFICIAL SPECIMEN)

## 2020-02-27 NOTE — ED Provider Notes (Signed)
[facility] EMERGENCY DEPARTMENT Provider Note   CSN: [id number] Arrival date & time: 02/25/20  8184     History Chief Complaint  Patient presents with  . Wound Check    Ricky Blackwell is a 59 y.o. male presenting for re-evaluation of an abscess on his right knee.  He initially developed a small papule at this site 1 week ago which has spread and is now draining purulence. He was seen at an outside urgent care facility, started on augmentin, but the redness surrounding the wound continued to spread. He had a culture performed which he states was positive for MRSA.  Seen here yesterday and was given IV rocephin and started on doxycycline.  The wound continues to drain, but he endorses that the swelling is much smaller and the redness as decreased.  He was told to return today for an additional round of IV antibiotics. He denies fevers, chills or any other complaint, no radiation of pain.     HPI     Past Medical History:  Diagnosis Date  . Anal stenosis   . Arthritis   . Colitis   . Crohn's disease ([place]) 1993  . H/O hiatal hernia   . History of kidney stones   . Hypertension   . Irritable bowel syndrome   . Pneumonia    hx  . Rectal bleeding   . Unspecified constipation     Patient Active Problem List   Diagnosis Date Noted  . Chest pain 10/17/2019  . LLQ pain 09/19/2019  . Diarrhea 09/19/2019  . Crohn's disease of both small and large intestine without complication ([place]) 03/75/4360  . Mucosal abnormality of colon   . Crohn's colitis ([place]) 09/09/2014  . Crohn's disease ([place]) 06/26/2008  . COLITIS 06/26/2008  . UNSPECIFIED CONSTIPATION 06/26/2008  . ANAL STENOSIS 06/26/2008  . RECTAL BLEEDING 06/26/2008  . IRRITABLE BOWEL SYNDROME, HX OF 06/26/2008    Past Surgical History:  Procedure Laterality Date  . ABDOMINAL HERNIA REPAIR  2010  . BACK SURGERY  2000  . COLONOSCOPY  05/2004   Dr. Amedeo Plenty- [facility]- crohns   . COLONOSCOPY  08/20/2008   Dr. Gala Romney- normal  distal rectum, proximal rectum, scattered 1-2 mm aphthous ulcers, these extended into the distal sigmoid.  Marland Kitchen COLONOSCOPY N/A 10/03/2014   [name]: findings c/w mildly active ileocolonic crohn's with scattered aphthous ulcers in the distal sigmoid colon, descending colon, ascending colon, terminal ileum. Transverse colon was normal.  . COLONOSCOPY WITH PROPOFOL N/A 05/25/2017   Procedure: COLONOSCOPY WITH PROPOFOL;  Surgeon: Daneil Dolin, MD;  scattered ulcers and erythema involving the rectum colon and terminal ileum consistent with Crohn's disease, one benign 4 mm sessile polyp, s/p frequent segmental biopsies.  Pathology with chronic mildly active ileitis and mildly active colitis in ascending colon, sigmoid colon, and rectum.  Repeat in 2 years.  Marland Kitchen KNEE SURGERY Right 2010   Left Arthoscopic  . NECK SURGERY    . NECK SURGERY N/A 2005   metal plate in neck  . POLYPECTOMY  05/25/2017   Procedure: POLYPECTOMY;  Surgeon: Daneil Dolin, MD;  [facility];  Service: Endoscopy;;  Sigmoid colon (CS)  . TOTAL KNEE ARTHROPLASTY Left 02/22/2013   Procedure: LEFT TOTAL KNEE ARTHROPLASTY;  Surgeon: Yvette Rack., MD;  Location: [place];  Service: Orthopedics;  Laterality: Left;       Family History  Problem Relation Age of Onset  . Diabetes Mother   . Heart failure Mother   .  Heart failure Father   . Colon polyps Father   . Cancer - Lung Father   . COPD Sister   . Diabetes type II Sister   . Hypertension Sister   . Sleep apnea Sister   . Arthritis Sister   . Colitis Sister        Maybe?  . Irregular heart beat Brother   . Alcoholism Brother   . Arthritis Brother   . Colon cancer Neg Hx     Social History   Tobacco Use  . Smoking status: Former Smoker    Packs/day: 2.00    Years: 10.00    Pack years: 20.00    Types: Cigarettes    Quit date: 02/13/1987    Years since quitting: 33.0  . Smokeless tobacco: Never Used  Vaping Use  . Vaping Use: Never used  Substance Use  Topics  . Alcohol use: No  . Drug use: No    Home Medications Prior to Admission medications   Medication Sig Start Date End Date Taking? Authorizing Provider  cyclobenzaprine (FLEXERIL) 10 MG tablet Take 10 mg by mouth 3 (three) times daily as needed for muscle spasms.    [provider]  doxycycline (VIBRAMYCIN) 100 MG capsule Take 1 capsule (100 mg total) by mouth 2 (two) times daily. 02/24/20   Fransico Meadow, PA-C  hydrochlorothiazide (MICROZIDE) 12.5 MG capsule Take 12.5 mg by mouth daily.    [provider]  lisinopril (PRINIVIL,ZESTRIL) 10 MG tablet Take 10 mg by mouth daily.  03/23/17   [provider]  mesalamine (LIALDA) 1.2 g EC tablet TAKE 4 (FOUR) TABLETS EVERY MORNING 01/20/20   Carlis Stable, NP  mupirocin cream (BACTROBAN) 2 % Apply 1 application topically 2 (two) times daily. 02/25/20   Evalee Jefferson, PA-C  oxyCODONE-acetaminophen (PERCOCET/ROXICET) 5-325 MG tablet Take 1 tablet by mouth every 4 (four) hours as needed. 02/25/20   Sabirin Baray, Almyra Free, PA-C  predniSONE (DELTASONE) 10 MG tablet Take 4 tablets (40 mg total) by mouth daily x7 days, then take 3 tablets (30 mg total) by mouth daily x 7 days, then take 2 tablets (20 mg total) by mouth daily x 7 days, then take 1 tablet (10 mg total) by mouth daily x 7 days, then stop. 09/20/19   Erenest Rasher, PA-C  tamsulosin (FLOMAX) 0.4 MG CAPS capsule Take 0.4 mg by mouth daily after supper. 03/28/17   [provider]  triamcinolone cream (KENALOG) 0.5 % Apply 1 application topically 2 (two) times daily as needed (for skin irritation).  08/06/14   [provider]    Allergies    Toradol [ketorolac tromethamine]  Review of Systems   Review of Systems  Constitutional: Negative for chills and fever.  HENT: Negative.   Respiratory: Negative.   Cardiovascular: Negative.   Gastrointestinal: Negative.   Musculoskeletal: Negative for arthralgias.  Skin: Positive for color change and wound.    Neurological: Negative for numbness.  All other systems reviewed and are negative.   Physical Exam Updated Vital Signs BP 136/68 (BP Location: Left Arm)   Pulse 60   Temp 98.4 F (36.9 C) (Oral)   Resp 16   SpO2 96%   Physical Exam Constitutional:      Appearance: He is well-developed.  HENT:     Head: Normocephalic.  Cardiovascular:     Rate and Rhythm: Normal rate.  Pulmonary:     Effort: Pulmonary effort is normal.  Musculoskeletal:        General:  No tenderness.  Skin:    Findings: Abscess and erythema present.     Comments: Draining abscess right patella. Surrounding erythema c/w cellulitis, currently measuring 8x9 cm, which is reduced in size by about 2 cm from old skin marker lines. No fluctuant abscess pocket present, but small amount of purulence expressed from central wound opening.    Neurological:     Mental Status: He is alert and oriented to person, place, and time.     Sensory: No sensory deficit.     ED Results / Procedures / Treatments   Labs (all labs ordered are listed, but only abnormal results are displayed) Labs Reviewed  CBG MONITORING, ED - Abnormal; Notable for the following components:      Result Value   Glucose-Capillary 106 (*)    All other components within normal limits  AEROBIC CULTURE (SUPERFICIAL SPECIMEN)    EKG None  Radiology No results found.  Procedures Procedures (including critical care time)  Medications Ordered in ED Medications  cefTRIAXone (ROCEPHIN) 1 g in sodium chloride 0.9 % 100 mL IVPB (0 g Intravenous Stopped 02/25/20 1239)  mupirocin ointment (BACTROBAN) 2 % ( Topical Given 02/25/20 1350)    ED Course  I have reviewed the triage vital signs and the nursing notes.  Pertinent labs & imaging results that were available during my care of the patient were reviewed by me and considered in my medical decision making (see chart for details).    MDM Rules/Calculators/A&P                          Pt with  improving cellulitis/abscess. Wound culture obtained to confirm MrSA.  He was given additional dose of rocephin. Since the cellulitis site is reduced today, and pt states the central swelling is significantly reduced, will plan continuation with the doxycycline.  Advised pt to started warm water soaks with gentle massage of the site to encourage continued resolution.  Added mupirocin for additional coverage.  He states he was also given a topical abx ointment at the urgent care but does not know name - asked to compare to this new prescription and switch to mupirocin if not the same.  Advised recheck by pcp for any worsening of this infection. Final Clinical Impression(s) / ED Diagnoses Final diagnoses:  Abscess  MRSA (methicillin resistant Staphylococcus aureus)    Rx / DC Orders ED Discharge Orders         Ordered    oxyCODONE-acetaminophen (PERCOCET/ROXICET) 5-325 MG tablet  Every 4 hours PRN        02/25/20 1324    mupirocin cream (BACTROBAN) 2 %  2 times daily        02/25/20 1324           Evalee Jefferson, PA-C 02/27/20 8016    Milton Ferguson, MD 02/28/20 [phone]

## 2020-02-28 LAB — AEROBIC CULTURE W GRAM STAIN (SUPERFICIAL SPECIMEN)

## 2020-03-01 ENCOUNTER — Telehealth: Payer: Self-pay | Admitting: Emergency Medicine

## 2020-03-01 NOTE — Telephone Encounter (Signed)
Post ED Visit - Positive Culture Follow-up  Culture report reviewed by antimicrobial stewardship pharmacist: [place] Team []  Elenor Quinones, Pharm.D. []  Heide Guile, Pharm.D., BCPS AQ-ID []  Parks Neptune, Pharm.D., BCPS []  Alycia Rossetti, Pharm.D., BCPS []  [place], [place].D., BCPS, AAHIVP []  Legrand Como, Pharm.D., BCPS, AAHIVP []  Salome Arnt, PharmD, BCPS []  Johnnette Gourd, PharmD, BCPS []  Hughes Better, PharmD, BCPS [x]  Duanne Limerick, PharmD []  Laqueta Linden, PharmD, BCPS []  Albertina Parr, PharmD  [place] Team []  Leodis Sias, PharmD []  Lindell Spar, PharmD []  Royetta Asal, PharmD []  Graylin Shiver, Rph []  Rema Fendt) Glennon Mac, PharmD []  Arlyn Dunning, PharmD []  Netta Cedars, PharmD []  Dia Sitter, PharmD []  Leone Haven, PharmD []  Gretta Arab, PharmD []  Theodis Shove, PharmD []  Peggyann Juba, PharmD []  Reuel Boom, PharmD   Positive aerobic culture Treated with Doxycycline, organism sensitive to the same and no further patient follow-up is required at this time.  Ricky Blackwell 03/01/2020, 2:02 PM

## 2020-03-22 NOTE — Progress Notes (Deleted)
Referring Provider: Adaline Sill, NP Primary Care Physician:  Adaline Sill, NP Primary GI Physician: Dr. Rayne Du chief complaint on file.   HPI:   Ricky Blackwell is a 59 y.o. male presenting today for follow-up of Crohn's disease and to discuss scheduling colonoscopy.  History of ileocolonic Crohn's disease diagnosed in 59.  Also with history of anal stenosis previously seen by Dr. Morton Stall at [facility].  He has been maintaining on Lialda 4.8 g daily.  Last colonoscopy February 2019 with scattered ulcers and erythema involving the rectum, colon, and TI consistent with Crohn's disease, 1 benign 4 mm sessile polyp, s/p frequent segmental biopsies.  Pathology with chronic mildly active ileitis and mildly active colitis in a sending and sigmoid colon and rectum.  Recommended continuing Lialda and repeating colonoscopy in 2 years.  Patient had a flare in June 2021 most located bloody BMs, left-sided abdominal pain, nausea vomiting.  Suspected this may have been triggered by NSAIDs.  He was treated with prednisone.  Last seen in our office 10/17/2019.  He had 1 day left of prednisone.  He was doing very well without abdominal pain, diarrhea, BRBPR, or melena.  Bowels at baseline with 4-6 soft formed BMs daily.  No other GI concerns.  And central chest pain that was associated with stress at work x5-6 months.  Recommend continuing current medications, follow-up in office in December to discuss scheduling the, and follow-up with PCP on chest pain.  Today:  ASAIII, prop  Past Medical History:  Diagnosis Date  . Anal stenosis   . Arthritis   . Colitis   . Crohn's disease ([place]) 1993  . H/O hiatal hernia   . History of kidney stones   . Hypertension   . Irritable bowel syndrome   . Pneumonia    hx  . Rectal bleeding   . Unspecified constipation     Past Surgical History:  Procedure Laterality Date  . ABDOMINAL HERNIA REPAIR  2010  . BACK SURGERY  2000  . COLONOSCOPY  05/2004    Dr. Amedeo Plenty- [facility]- crohns   . COLONOSCOPY  08/20/2008   Dr. Gala Romney- normal distal rectum, proximal rectum, scattered 1-2 mm aphthous ulcers, these extended into the distal sigmoid.  Marland Kitchen COLONOSCOPY N/A 10/03/2014   [name]: findings c/w mildly active ileocolonic crohn's with scattered aphthous ulcers in the distal sigmoid colon, descending colon, ascending colon, terminal ileum. Transverse colon was normal.  . COLONOSCOPY WITH PROPOFOL N/A 05/25/2017   Procedure: COLONOSCOPY WITH PROPOFOL;  Surgeon: Daneil Dolin, MD;  scattered ulcers and erythema involving the rectum colon and terminal ileum consistent with Crohn's disease, one benign 4 mm sessile polyp, s/p frequent segmental biopsies.  Pathology with chronic mildly active ileitis and mildly active colitis in ascending colon, sigmoid colon, and rectum.  Repeat in 2 years.  Marland Kitchen KNEE SURGERY Right 2010   Left Arthoscopic  . NECK SURGERY    . NECK SURGERY N/A 2005   metal plate in neck  . POLYPECTOMY  05/25/2017   Procedure: POLYPECTOMY;  Surgeon: Daneil Dolin, MD;  [facility];  Service: Endoscopy;;  Sigmoid colon (CS)  . TOTAL KNEE ARTHROPLASTY Left 02/22/2013   Procedure: LEFT TOTAL KNEE ARTHROPLASTY;  Surgeon: Yvette Rack., MD;  Location: [place];  Service: Orthopedics;  Laterality: Left;    Current Outpatient Medications  Medication Sig Dispense Refill  . cyclobenzaprine (FLEXERIL) 10 MG tablet Take 10 mg by mouth 3 (three) times  daily as needed for muscle spasms.    Marland Kitchen doxycycline (VIBRAMYCIN) 100 MG capsule Take 1 capsule (100 mg total) by mouth 2 (two) times daily. 14 capsule 0  . hydrochlorothiazide (MICROZIDE) 12.5 MG capsule Take 12.5 mg by mouth daily.    Marland Kitchen lisinopril (PRINIVIL,ZESTRIL) 10 MG tablet Take 10 mg by mouth daily.   0  . mesalamine (LIALDA) 1.2 g EC tablet TAKE 4 (FOUR) TABLETS EVERY MORNING 120 tablet 11  . mupirocin cream (BACTROBAN) 2 % Apply 1 application topically 2 (two) times daily. 15 g 0  .  oxyCODONE-acetaminophen (PERCOCET/ROXICET) 5-325 MG tablet Take 1 tablet by mouth every 4 (four) hours as needed. 20 tablet 0  . predniSONE (DELTASONE) 10 MG tablet Take 4 tablets (40 mg total) by mouth daily x7 days, then take 3 tablets (30 mg total) by mouth daily x 7 days, then take 2 tablets (20 mg total) by mouth daily x 7 days, then take 1 tablet (10 mg total) by mouth daily x 7 days, then stop. 70 tablet 0  . tamsulosin (FLOMAX) 0.4 MG CAPS capsule Take 0.4 mg by mouth daily after supper.  10  . triamcinolone cream (KENALOG) 0.5 % Apply 1 application topically 2 (two) times daily as needed (for skin irritation).   0   No current facility-administered medications for this visit.    Allergies as of 03/23/2020 - Review Complete 02/25/2020  Allergen Reaction Noted  . Toradol [ketorolac tromethamine]  02/25/2020    Family History  Problem Relation Age of Onset  . Diabetes Mother   . Heart failure Mother   . Heart failure Father   . Colon polyps Father   . Cancer - Lung Father   . COPD Sister   . Diabetes type II Sister   . Hypertension Sister   . Sleep apnea Sister   . Arthritis Sister   . Colitis Sister        Maybe?  . Irregular heart beat Brother   . Alcoholism Brother   . Arthritis Brother   . Colon cancer Neg Hx     Social History   Socioeconomic History  . Marital status: Married    Spouse name: Not on file  . Number of children: Not on file  . Years of education: Not on file  . Highest education level: Not on file  Occupational History  . Not on file  Tobacco Use  . Smoking status: Former Smoker    Packs/day: 2.00    Years: 10.00    Pack years: 20.00    Types: Cigarettes    Quit date: 02/13/1987    Years since quitting: 33.1  . Smokeless tobacco: Never Used  Vaping Use  . Vaping Use: Never used  Substance and Sexual Activity  . Alcohol use: No  . Drug use: No  . Sexual activity: Yes    Birth control/protection: None  Other Topics Concern  . Not on  file  Social History Narrative  . Not on file   Social Determinants of Health   Financial Resource Strain: Not on file  Food Insecurity: Not on file  Transportation Needs: Not on file  Physical Activity: Not on file  Stress: Not on file  Social Connections: Not on file    Review of Systems: Gen: Denies fever, chills, anorexia. Denies fatigue, weakness, weight loss.  CV: Denies chest pain, palpitations, syncope, peripheral edema, and claudication. Resp: Denies dyspnea at rest, cough, wheezing, coughing up blood, and pleurisy. GI: Denies vomiting blood,  jaundice, and fecal incontinence.   Denies dysphagia or odynophagia. Derm: Denies rash, itching, dry skin Psych: Denies depression, anxiety, memory loss, confusion. No homicidal or suicidal ideation.  Heme: Denies bruising, bleeding, and enlarged lymph nodes.  Physical Exam: There were no vitals taken for this visit. General:   Alert and oriented. No distress noted. Pleasant and cooperative.  Head:  Normocephalic and atraumatic. Eyes:  Conjuctiva clear without scleral icterus. Mouth:  Oral mucosa pink and moist. Good dentition. No lesions. Heart:  S1, S2 present without murmurs appreciated. Lungs:  Clear to auscultation bilaterally. No wheezes, rales, or rhonchi. No distress.  Abdomen:  +BS, soft, non-tender and non-distended. No rebound or guarding. No HSM or masses noted. Msk:  Symmetrical without gross deformities. Normal posture. Extremities:  Without edema. Neurologic:  Alert and  oriented x4 Psych:  Alert and cooperative. Normal mood and affect.

## 2020-03-23 ENCOUNTER — Ambulatory Visit: Payer: Commercial Managed Care - PPO | Admitting: Gastroenterology

## 2020-04-11 DIAGNOSIS — U071 COVID-19: Secondary | ICD-10-CM

## 2020-04-11 HISTORY — DX: COVID-19: U07.1

## 2020-05-03 NOTE — Progress Notes (Signed)
Referring Provider: Adaline Sill, NP Primary Care Physician:  Adaline Sill, NP Primary GI Physician: Dr. Gala Romney  Chief Complaint  Patient presents with  . Crohn's Disease    Doing ok    HPI:   Ricky Blackwell is a 60 y.o. male with a history of ileocolonic Crohn's disease, diagnosed in 47.Also with history of anal stenosis previously seen by Dr. Morton Stall at [facility]. Has been maintained on Lialda 4.8g daily chronically. Last colonoscopy Feb 2019 with scattered ulcers and erythema involving the rectum colon and terminal ileum consistent with Crohn's disease, one benign 4 mm sessile polyp, s/p frequent segmental biopsies. Pathology with chronic mildly active ileitis and mildly active colitisin ascending colon, sigmoid colon, and rectum. Polyp benign.  Recommended continuing Lialda and repeat colonoscopy in 2 years.  Patient experienced Crohn's flare in June 2021, possibly triggered by diclofenac, treated with prednisone taper with significant clinical improvement.  He is presenting today for follow-up and to discuss scheduling surveillance colonoscopy.  Last seen in our office 10/17/2019.  Bowel habits have returned to baseline with 4-6 BMs daily, no abdominal pain, BRBPR, or melena.  Denied NSAIDs, alcohol use, or tobacco use.  No upper GI concerns.  Plan to update colonoscopy in 6 months to evaluate for histologic remission.  Would need to consider advancing therapy if he has relapse or if he continues with active colitis on colonoscopy.  He would continue Lialda 4.8 g daily for now.  Patient canceled his follow-up appointment in December 2021.  Today: Continues to do very well.  No abdominal pain, constipation, or diarrhea.  He is at baseline with 4-6 solid BMs daily.  No watery stools.  No BRBPR or melena.  He was diagnosed with Covid last Tuesday.  He had a little diarrhea with this, but diarrhea has resolved.  Today is his first day back to work.  Overall, symptoms with Covid  were very mild including temperature and 99 range and mild cough.  All symptoms have resolved at this point.  He continues to avoid all NSAIDs.  No significant upper GI symptoms.  Denies nausea, vomiting, GERD symptoms, and dysphagia.  Reports occasional twinge of pain in his chest lasting for a few seconds that is not associated with activity. PCP is aware and is monitoring. No work-up felt necessary at this point.  Denies palpitations.  Denies shortness of breath.  Past Medical History:  Diagnosis Date  . Anal stenosis   . Arthritis   . Colitis   . COVID-19 04/2020  . Crohn's disease ([place]) 1993  . H/O hiatal hernia   . History of kidney stones   . Hypertension   . Irritable bowel syndrome   . Pneumonia    hx  . Rectal bleeding   . Unspecified constipation     Past Surgical History:  Procedure Laterality Date  . ABDOMINAL HERNIA REPAIR  2010  . BACK SURGERY  2000  . COLONOSCOPY  05/2004   Dr. Amedeo Plenty- [facility]- crohns   . COLONOSCOPY  08/20/2008   Dr. Gala Romney- normal distal rectum, proximal rectum, scattered 1-2 mm aphthous ulcers, these extended into the distal sigmoid.  Marland Kitchen COLONOSCOPY N/A 10/03/2014   [name]: findings c/w mildly active ileocolonic crohn's with scattered aphthous ulcers in the distal sigmoid colon, descending colon, ascending colon, terminal ileum. Transverse colon was normal.  . COLONOSCOPY WITH PROPOFOL N/A 05/25/2017   Procedure: COLONOSCOPY WITH PROPOFOL;  Surgeon: Daneil Dolin, MD;  scattered ulcers and erythema involving the  rectum colon and terminal ileum consistent with Crohn's disease, one benign 4 mm sessile polyp, s/p frequent segmental biopsies.  Pathology with chronic mildly active ileitis and mildly active colitis in ascending colon, sigmoid colon, and rectum.  Repeat in 2 years.  Marland Kitchen KNEE SURGERY Right 2010   Left Arthoscopic  . NECK SURGERY    . NECK SURGERY N/A 2005   metal plate in neck  . POLYPECTOMY  05/25/2017   Procedure: POLYPECTOMY;   Surgeon: Daneil Dolin, MD;  [facility];  Service: Endoscopy;;  Sigmoid colon (CS)  . TOTAL KNEE ARTHROPLASTY Left 02/22/2013   Procedure: LEFT TOTAL KNEE ARTHROPLASTY;  Surgeon: Yvette Rack., MD;  Location: [facility];  Service: Orthopedics;  Laterality: Left;    Current Outpatient Medications  Medication Sig Dispense Refill  . cyclobenzaprine (FLEXERIL) 10 MG tablet Take 10 mg by mouth 3 (three) times daily as needed for muscle spasms.    . hydrochlorothiazide (MICROZIDE) 12.5 MG capsule Take 12.5 mg by mouth daily.    Marland Kitchen lisinopril (PRINIVIL,ZESTRIL) 10 MG tablet Take 10 mg by mouth daily.   0  . mesalamine (LIALDA) 1.2 g EC tablet TAKE 4 (FOUR) TABLETS EVERY MORNING 120 tablet 11  . oxyCODONE-acetaminophen (PERCOCET/ROXICET) 5-325 MG tablet Take 1 tablet by mouth every 4 (four) hours as needed. 20 tablet 0  . tamsulosin (FLOMAX) 0.4 MG CAPS capsule Take 0.4 mg by mouth daily after supper.  10  . triamcinolone cream (KENALOG) 0.5 % Apply 1 application topically 2 (two) times daily as needed (for skin irritation).   0   No current facility-administered medications for this visit.    Allergies as of 05/04/2020 - Review Complete 05/04/2020  Allergen Reaction Noted  . Toradol [ketorolac tromethamine]  02/25/2020    Family History  Problem Relation Age of Onset  . Diabetes Mother   . Heart failure Mother   . Heart failure Father   . Colon polyps Father   . Cancer - Lung Father   . COPD Sister   . Diabetes type II Sister   . Hypertension Sister   . Sleep apnea Sister   . Arthritis Sister   . Colitis Sister        Maybe?  . Irregular heart beat Brother   . Alcoholism Brother   . Arthritis Brother   . Colon cancer Neg Hx   . Inflammatory bowel disease Neg Hx     Social History   Socioeconomic History  . Marital status: Married    Spouse name: Not on file  . Number of children: Not on file  . Years of education: Not on file  . Highest education level: Not on  file  Occupational History  . Not on file  Tobacco Use  . Smoking status: Former Smoker    Packs/day: 2.00    Years: 10.00    Pack years: 20.00    Types: Cigarettes    Quit date: 02/13/1987    Years since quitting: 33.2  . Smokeless tobacco: Never Used  Vaping Use  . Vaping Use: Never used  Substance and Sexual Activity  . Alcohol use: No  . Drug use: No  . Sexual activity: Yes    Birth control/protection: None  Other Topics Concern  . Not on file  Social History Narrative  . Not on file   Social Determinants of Health   Financial Resource Strain: Not on file  Food Insecurity: Not on file  Transportation Needs: Not on file  Physical Activity: Not on file  Stress: Not on file  Social Connections: Not on file    Review of Systems: Gen: Denies fever, chills, cold or flulike symptoms, presyncope, syncope. CV: See HPI Resp: See HPI GI: See HPI Heme: See HPI  Physical Exam: BP 130/69   Pulse 67   Temp (!) 97.3 F (36.3 C) (Temporal)   Ht 6' (1.829 m)   Wt (!) 311 lb 6.4 oz (141.3 kg)   BMI 42.23 kg/m  General:   Alert and oriented. No distress noted. Pleasant and cooperative.  Head:  Normocephalic and atraumatic. Eyes:  Conjuctiva clear without scleral icterus. Heart:  S1, S2 present without murmurs appreciated. Lungs:  Clear to auscultation bilaterally. No wheezes, rales, or rhonchi. No distress.  Abdomen:  Obese abdomen. +BS, soft, non-tender. No rebound or guarding. No HSM or masses noted although exam limited due to body habitus. Msk:  Symmetrical without gross deformities. Normal posture. Extremities:  Without edema. Neurologic:  Alert and  oriented x4 Psych:  Normal mood and affect.

## 2020-05-03 NOTE — H&P (View-Only) (Signed)
  Referring Provider: Dickey, Kirkland M, NP Primary Care Physician:  Dickey, Kirkland M, NP Primary GI Physician: Dr. [name]  Chief Complaint  Patient presents with  . Crohn's Disease    Doing ok    HPI:   Ricky Blackwell is a 60 y.o. male with a history of ileocolonic Crohn's disease, diagnosed in 1993.Also with history of anal stenosis previously seen by Dr. Waters at [facility]. Has been maintained on Lialda 4.8g daily chronically. Last colonoscopy Feb 2019 with scattered ulcers and erythema involving the rectum colon and terminal ileum consistent with Crohn's disease, one benign 4 mm sessile polyp, s/p frequent segmental biopsies. Pathology with chronic mildly active ileitis and mildly active colitisin ascending colon, sigmoid colon, and rectum. Polyp benign.  Recommended continuing Lialda and repeat colonoscopy in 2 years.  Patient experienced Crohn's flare in June 2021, possibly triggered by diclofenac, treated with prednisone taper with significant clinical improvement.  He is presenting today for follow-up and to discuss scheduling surveillance colonoscopy.  Last seen in our office 10/17/2019.  Bowel habits have returned to baseline with 4-6 BMs daily, no abdominal pain, BRBPR, or melena.  Denied NSAIDs, alcohol use, or tobacco use.  No upper GI concerns.  Plan to update colonoscopy in 6 months to evaluate for histologic remission.  Would need to consider advancing therapy if he has relapse or if he continues with active colitis on colonoscopy.  He would continue Lialda 4.8 g daily for now.  Patient canceled his follow-up appointment in December 2021.  Today: Continues to do very well.  No abdominal pain, constipation, or diarrhea.  He is at baseline with 4-6 solid BMs daily.  No watery stools.  No BRBPR or melena.  He was diagnosed with Covid last Tuesday.  He had a little diarrhea with this, but diarrhea has resolved.  Today is his first day back to work.  Overall, symptoms with Covid  were very mild including temperature and 99 range and mild cough.  All symptoms have resolved at this point.  He continues to avoid all NSAIDs.  No significant upper GI symptoms.  Denies nausea, vomiting, GERD symptoms, and dysphagia.  Reports occasional twinge of pain in his chest lasting for a few seconds that is not associated with activity. PCP is aware and is monitoring. No work-up felt necessary at this point.  Denies palpitations.  Denies shortness of breath.  Past Medical History:  Diagnosis Date  . Anal stenosis   . Arthritis   . Colitis   . COVID-19 04/2020  . Crohn's disease ([facility]) 1993  . H/O hiatal hernia   . History of kidney stones   . Hypertension   . Irritable bowel syndrome   . Pneumonia    hx  . Rectal bleeding   . Unspecified constipation     Past Surgical History:  Procedure Laterality Date  . ABDOMINAL HERNIA REPAIR  2010  . BACK SURGERY  2000  . COLONOSCOPY  05/2004   Dr. Hayes- [facility]- crohns   . COLONOSCOPY  08/20/2008   Dr. [name]- normal distal rectum, proximal rectum, scattered 1-2 mm aphthous ulcers, these extended into the distal sigmoid.  . COLONOSCOPY N/A 10/03/2014   [name]: findings c/w mildly active ileocolonic crohn's with scattered aphthous ulcers in the distal sigmoid colon, descending colon, ascending colon, terminal ileum. Transverse colon was normal.  . COLONOSCOPY WITH PROPOFOL N/A 05/25/2017   Procedure: COLONOSCOPY WITH PROPOFOL;  Surgeon: [name], Robert M, MD;  scattered ulcers and erythema involving the   rectum colon and terminal ileum consistent with Crohn's disease, one benign 4 mm sessile polyp, s/p frequent segmental biopsies.  Pathology with chronic mildly active ileitis and mildly active colitis in ascending colon, sigmoid colon, and rectum.  Repeat in 2 years.  . KNEE SURGERY Right 2010   Left Arthoscopic  . NECK SURGERY    . NECK SURGERY N/A 2005   metal plate in neck  . POLYPECTOMY  05/25/2017   Procedure: POLYPECTOMY;   Surgeon: [name], Robert M, MD;  [facility];  Service: Endoscopy;;  Sigmoid colon (CS)  . TOTAL KNEE ARTHROPLASTY Left 02/22/2013   Procedure: LEFT TOTAL KNEE ARTHROPLASTY;  Surgeon: W D Caffrey Jr., MD;  Location: [facility];  Service: Orthopedics;  Laterality: Left;    Current Outpatient Medications  Medication Sig Dispense Refill  . cyclobenzaprine (FLEXERIL) 10 MG tablet Take 10 mg by mouth 3 (three) times daily as needed for muscle spasms.    . hydrochlorothiazide (MICROZIDE) 12.5 MG capsule Take 12.5 mg by mouth daily.    . lisinopril (PRINIVIL,ZESTRIL) 10 MG tablet Take 10 mg by mouth daily.   0  . mesalamine (LIALDA) 1.2 g EC tablet TAKE 4 (FOUR) TABLETS EVERY MORNING 120 tablet 11  . oxyCODONE-acetaminophen (PERCOCET/ROXICET) 5-325 MG tablet Take 1 tablet by mouth every 4 (four) hours as needed. 20 tablet 0  . tamsulosin (FLOMAX) 0.4 MG CAPS capsule Take 0.4 mg by mouth daily after supper.  10  . triamcinolone cream (KENALOG) 0.5 % Apply 1 application topically 2 (two) times daily as needed (for skin irritation).   0   No current facility-administered medications for this visit.    Allergies as of 05/04/2020 - Review Complete 05/04/2020  Allergen Reaction Noted  . Toradol [ketorolac tromethamine]  02/25/2020    Family History  Problem Relation Age of Onset  . Diabetes Mother   . Heart failure Mother   . Heart failure Father   . Colon polyps Father   . Cancer - Lung Father   . COPD Sister   . Diabetes type II Sister   . Hypertension Sister   . Sleep apnea Sister   . Arthritis Sister   . Colitis Sister        Maybe?  . Irregular heart beat Brother   . Alcoholism Brother   . Arthritis Brother   . Colon cancer Neg Hx   . Inflammatory bowel disease Neg Hx     Social History   Socioeconomic History  . Marital status: Married    Spouse name: Not on file  . Number of children: Not on file  . Years of education: Not on file  . Highest education level: Not on  file  Occupational History  . Not on file  Tobacco Use  . Smoking status: Former Smoker    Packs/day: 2.00    Years: 10.00    Pack years: 20.00    Types: Cigarettes    Quit date: 02/13/1987    Years since quitting: 33.2  . Smokeless tobacco: Never Used  Vaping Use  . Vaping Use: Never used  Substance and Sexual Activity  . Alcohol use: No  . Drug use: No  . Sexual activity: Yes    Birth control/protection: None  Other Topics Concern  . Not on file  Social History Narrative  . Not on file   Social Determinants of Health   Financial Resource Strain: Not on file  Food Insecurity: Not on file  Transportation Needs: Not on file    Physical Activity: Not on file  Stress: Not on file  Social Connections: Not on file    Review of Systems: Gen: Denies fever, chills, cold or flulike symptoms, presyncope, syncope. CV: See HPI Resp: See HPI GI: See HPI Heme: See HPI  Physical Exam: BP 130/69   Pulse 67   Temp (!) 97.3 F (36.3 C) (Temporal)   Ht 6' (1.829 m)   Wt (!) 311 lb 6.4 oz (141.3 kg)   BMI 42.23 kg/m  General:   Alert and oriented. No distress noted. Pleasant and cooperative.  Head:  Normocephalic and atraumatic. Eyes:  Conjuctiva clear without scleral icterus. Heart:  S1, S2 present without murmurs appreciated. Lungs:  Clear to auscultation bilaterally. No wheezes, rales, or rhonchi. No distress.  Abdomen:  Obese abdomen. +BS, soft, non-tender. No rebound or guarding. No HSM or masses noted although exam limited due to body habitus. Msk:  Symmetrical without gross deformities. Normal posture. Extremities:  Without edema. Neurologic:  Alert and  oriented x4 Psych:  Normal mood and affect. 

## 2020-05-04 ENCOUNTER — Ambulatory Visit: Payer: Commercial Managed Care - PPO | Admitting: Gastroenterology

## 2020-05-04 ENCOUNTER — Other Ambulatory Visit: Payer: Self-pay

## 2020-05-04 ENCOUNTER — Encounter: Payer: Self-pay | Admitting: *Deleted

## 2020-05-04 ENCOUNTER — Telehealth: Payer: Self-pay | Admitting: *Deleted

## 2020-05-04 ENCOUNTER — Encounter: Payer: Self-pay | Admitting: Gastroenterology

## 2020-05-04 VITALS — BP 130/69 | HR 67 | Temp 97.3°F | Ht 72.0 in | Wt 311.4 lb

## 2020-05-04 DIAGNOSIS — K5080 Crohn's disease of both small and large intestine without complications: Secondary | ICD-10-CM | POA: Diagnosis not present

## 2020-05-04 NOTE — Patient Instructions (Signed)
We will get you scheduled for a colonoscopy in the near future with Dr. Gala Romney.   Continue Lialda 4.8g daily.   We will see you back in 6 months or sooner if needed.   Aliene Altes, PA-C [facility]

## 2020-05-04 NOTE — Telephone Encounter (Signed)
PA submitted via [employer]MR. Case ID# [id number]

## 2020-05-05 ENCOUNTER — Telehealth: Payer: Self-pay | Admitting: *Deleted

## 2020-05-05 NOTE — Telephone Encounter (Signed)
Called pt. Aware when pre-op/covid test appt is scheduled for. He voiced understanding.

## 2020-05-05 NOTE — Telephone Encounter (Signed)
Authorization is not required for this service

## 2020-05-06 ENCOUNTER — Encounter: Payer: Self-pay | Admitting: Gastroenterology

## 2020-05-06 NOTE — Assessment & Plan Note (Addendum)
60 year old male with history of ileocolonic Crohn's disease diagnosed in 1993 maintained on Lialda 4.8 g daily for several years.  Patient experienced a flare in June 2021, possibly triggered by diclofenac.  CT A/P at that time with no evidence of active IBD, stool studies negative, CRP slightly elevated at 1.5, sed rate normal.  He was treated with prednisone taper with clinical remission achieved and continues to do very well today.  He is at his baseline of 4-6 solid BMs daily with no BRBPR, melena, abdominal pain, or unintentional weight loss.  He is due for surveillance colonoscopy.  Last colonoscopy in February 2019 with scattered ulcers and erythema involving the rectum, ascending segments, and TI consistent with Crohn's disease, 1 benign 4 mm sessile polyp, s/p frequent segmental biopsies.  Pathology with chronic mildly active ileitis and mild active colitis in ascending colon, sigmoid colon, and rectum.  Recommended continuing Lialda and surveillance colonoscopy in 2 years.  Plan:  Continue Lialda 4.8 g daily. Continue to avoid all NSAIDs. Proceed with colonoscopy with propofol with Dr. Gala Romney in the near future. The risks, benefits, and alternatives have been discussed with the patient in detail. The patient states understanding and desires to proceed.  ASA III May need to consider advancing therapy if evidence of active Crohn's disease on colonoscopy. Further recommendations per Dr. Gala Romney at time of colonoscopy. Follow-up in 6 months or sooner if needed.

## 2020-05-18 NOTE — Patient Instructions (Signed)
Your procedure is scheduled on: 05/21/2020  Report to [facility] at 11:30    AM.  Call this number if you have problems the morning of surgery: [phone]   Remember:              Follow Directions on the letter you received from Your Physician's office regarding the Bowel Prep              No Smoking the day of Procedure :   Take these medicines the morning of surgery with A SIP OF WATER: Oxycodone and flexeril if needed             No diabetic medication am of procedure   Do not wear jewelry, make-up or nail polish.    Do not bring valuables to the hospital.  Contacts, dentures or bridgework may not be worn into surgery.  .   Patients discharged the day of surgery will not be allowed to drive home.     Colonoscopy, Adult, Care After This sheet gives you information about how to care for yourself after your procedure. Your health care provider may also give you more specific instructions. If you have problems or questions, contact your health care provider. What can I expect after the procedure? After the procedure, it is common to have:  A small amount of blood in your stool for 24 hours after the procedure.  Some gas.  Mild abdominal cramping or bloating.  Follow these instructions at home: General instructions   For the first 24 hours after the procedure: ? Do not drive or use machinery. ? Do not sign important documents. ? Do not drink alcohol. ? Do your regular daily activities at a slower pace than normal. ? Eat soft, easy-to-digest foods. ? Rest often.  Take over-the-counter or prescription medicines only as told by your health care provider.  It is up to you to get the results of your procedure. Ask your health care provider, or the department performing the procedure, when your results will be ready. Relieving cramping and bloating  Try walking around when you have cramps or feel bloated.  Apply heat to your abdomen as told by your  health care provider. Use a heat source that your health care provider recommends, such as a moist heat pack or a heating pad. ? Place a towel between your skin and the heat source. ? Leave the heat on for 20-30 minutes. ? Remove the heat if your skin turns bright red. This is especially important if you are unable to feel pain, heat, or cold. You may have a greater risk of getting burned. Eating and drinking  Drink enough fluid to keep your urine clear or pale yellow.  Resume your normal diet as instructed by your health care provider. Avoid heavy or fried foods that are hard to digest.  Avoid drinking alcohol for as long as instructed by your health care provider. Contact a health care provider if:  You have blood in your stool 2-3 days after the procedure. Get help right away if:  You have more than a small spotting of blood in your stool.  You pass large blood clots in your stool.  Your abdomen is swollen.  You have nausea or vomiting.  You have a fever.  You have increasing abdominal pain that is not relieved with medicine. This information is not intended to replace advice given to you by your health care provider. Make sure you discuss any questions you  have with your health care provider. Document Released: 11/10/2003 Document Revised: 12/21/2015 Document Reviewed: 06/09/2015 Elsevier Interactive Patient Education  [employer].

## 2020-05-19 ENCOUNTER — Other Ambulatory Visit (HOSPITAL_COMMUNITY)
Admission: RE | Admit: 2020-05-19 | Discharge: 2020-05-19 | Disposition: A | Discharge status: Home / Self Care | Payer: Commercial Managed Care - PPO | Source: Ambulatory Visit | Attending: Internal Medicine | Admitting: Internal Medicine

## 2020-05-19 ENCOUNTER — Other Ambulatory Visit: Payer: Self-pay

## 2020-05-19 ENCOUNTER — Encounter (HOSPITAL_COMMUNITY)
Admission: RE | Admit: 2020-05-19 | Discharge: 2020-05-19 | Disposition: A | Discharge status: Home / Self Care | Payer: Commercial Managed Care - PPO | Source: Ambulatory Visit | Attending: Internal Medicine | Admitting: Internal Medicine

## 2020-05-19 DIAGNOSIS — Z01818 Encounter for other preprocedural examination: Secondary | ICD-10-CM | POA: Diagnosis present

## 2020-05-19 DIAGNOSIS — U071 COVID-19: Secondary | ICD-10-CM | POA: Diagnosis not present

## 2020-05-19 LAB — SARS CORONAVIRUS 2 (TAT 6-24 HRS): SARS Coronavirus 2: POSITIVE — AB

## 2020-05-20 ENCOUNTER — Telehealth: Payer: Self-pay

## 2020-05-20 ENCOUNTER — Telehealth: Payer: Self-pay | Admitting: *Deleted

## 2020-05-20 NOTE — Telephone Encounter (Signed)
FYI to [employer] PA.

## 2020-05-20 NOTE — Telephone Encounter (Signed)
Ricky Blackwell at endo called office, covid test positive. TCS scheduled for tomorrow.  Called Ricky Blackwell, stated he was covid positive the week before he had OV. Had covid test done at PCP office. He has no symptoms. He told girl that done covid test yesterday he had been positive.  Advised Ricky Blackwell to call PCP office and have them fax covid positive result to our office. Informed Ricky Blackwell. Advised him to continue prep as he's doing. He would not have been tested within 90 days if our office had known he was positive week of 04/27/20.

## 2020-05-20 NOTE — Telephone Encounter (Signed)
Received positive covid result from PCP dated 04/29/20. Endo scheduler informed. Pt has been added back on for procedure tomorrow. Faxed result to endo. Pt informed he has been added back on for procedure tomorrow and our office received his result.

## 2020-05-20 NOTE — Telephone Encounter (Signed)
Called to discuss with patient about COVID-19 symptoms and the use of one of the available treatments for those with mild to moderate Covid symptoms and at a high risk of hospitalization.  Pt appears to qualify for outpatient treatment due to co-morbid conditions and/or a member of an at-risk group in accordance with the FDA Emergency Use Authorization.   Patient was originally Covid positive on 04/29/20 and should not have been tested again within 90 days. Testing was part of his pre procedure scheduled for tomorrow.    Symptom onset:  Vaccinated:  Booster?  Immunocompromised?  Qualifiers:   Patient does not qualify.  Tarry Kos

## 2020-05-20 NOTE — Telephone Encounter (Signed)
Noted  

## 2020-05-21 ENCOUNTER — Other Ambulatory Visit: Payer: Self-pay

## 2020-05-21 ENCOUNTER — Ambulatory Visit (HOSPITAL_COMMUNITY): Payer: Commercial Managed Care - PPO | Admitting: Certified Registered Nurse Anesthetist

## 2020-05-21 ENCOUNTER — Encounter (HOSPITAL_COMMUNITY): Payer: Self-pay | Admitting: Internal Medicine

## 2020-05-21 ENCOUNTER — Ambulatory Visit (HOSPITAL_COMMUNITY)
Admission: RE | Admit: 2020-05-21 | Discharge: 2020-05-21 | Disposition: A | Discharge status: Home / Self Care | Payer: Commercial Managed Care - PPO | Attending: Internal Medicine | Admitting: Internal Medicine

## 2020-05-21 ENCOUNTER — Encounter (HOSPITAL_COMMUNITY)
Admission: RE | Disposition: A | Discharge status: Home / Self Care | Payer: Self-pay | Source: Home / Self Care | Attending: Internal Medicine

## 2020-05-21 DIAGNOSIS — K624 Stenosis of anus and rectum: Secondary | ICD-10-CM | POA: Diagnosis not present

## 2020-05-21 DIAGNOSIS — K5010 Crohn's disease of large intestine without complications: Secondary | ICD-10-CM | POA: Insufficient documentation

## 2020-05-21 DIAGNOSIS — Z09 Encounter for follow-up examination after completed treatment for conditions other than malignant neoplasm: Secondary | ICD-10-CM | POA: Diagnosis present

## 2020-05-21 HISTORY — PX: BIOPSY: SHX5522

## 2020-05-21 HISTORY — PX: COLONOSCOPY WITH PROPOFOL: SHX5780

## 2020-05-21 LAB — C-REACTIVE PROTEIN: CRP: 0.6 mg/dL (ref <1.0)

## 2020-05-21 LAB — GLUCOSE, CAPILLARY
Glucose-Capillary: 93 mg/dL (ref 70–99)
Glucose-Capillary: 98 mg/dL (ref 70–99)

## 2020-05-21 SURGERY — COLONOSCOPY WITH PROPOFOL
Anesthesia: General

## 2020-05-21 MED ORDER — LACTATED RINGERS IV SOLN
INTRAVENOUS | Status: DC
  Administered 2020-05-21: 1000 mL via INTRAVENOUS

## 2020-05-21 MED ORDER — LIDOCAINE HCL (CARDIAC) PF 100 MG/5ML IV SOSY
PREFILLED_SYRINGE | INTRAVENOUS | Status: DC | PRN
  Administered 2020-05-21: 80 mg via INTRAVENOUS

## 2020-05-21 MED ORDER — PROPOFOL 500 MG/50ML IV EMUL
INTRAVENOUS | Status: DC | PRN
  Administered 2020-05-21: 125 ug/kg/min via INTRAVENOUS

## 2020-05-21 MED ORDER — PROPOFOL 10 MG/ML IV BOLUS
INTRAVENOUS | Status: DC | PRN
Start: 2020-05-21 — End: 2020-05-21
  Administered 2020-05-21: 100 mg via INTRAVENOUS
  Administered 2020-05-21: 30 mg via INTRAVENOUS

## 2020-05-21 NOTE — Anesthesia Preprocedure Evaluation (Signed)
Anesthesia Evaluation  Patient identified by MRN, date of birth, ID band Patient awake    Reviewed: Allergy & Precautions, H&P , NPO status , Patient's Chart, lab work & pertinent test results, reviewed documented beta blocker date and time   Airway Mallampati: II  TM Distance: >3 FB Neck ROM: full    Dental no notable dental hx.    Pulmonary pneumonia, former smoker,    Pulmonary exam normal breath sounds clear to auscultation       Cardiovascular Exercise Tolerance: Good hypertension, negative cardio ROS   Rhythm:regular Rate:Normal     Neuro/Psych negative neurological ROS  negative psych ROS   GI/Hepatic Neg liver ROS, hiatal hernia,   Endo/Other  Morbid obesity  Renal/GU negative Renal ROS  negative genitourinary   Musculoskeletal   Abdominal   Peds  Hematology negative hematology ROS (+)   Anesthesia Other Findings   Reproductive/Obstetrics negative OB ROS                             Anesthesia Physical Anesthesia Plan  ASA: III  Anesthesia Plan: General   Post-op Pain Management:    Induction:   PONV Risk Score and Plan: Propofol infusion  Airway Management Planned:   Additional Equipment:   Intra-op Plan:   Post-operative Plan:   Informed Consent: I have reviewed the patients History and Physical, chart, labs and discussed the procedure including the risks, benefits and alternatives for the proposed anesthesia with the patient or authorized representative who has indicated his/her understanding and acceptance.     Dental Advisory Given  Plan Discussed with: CRNA  Anesthesia Plan Comments:         Anesthesia Quick Evaluation

## 2020-05-21 NOTE — Discharge Instructions (Signed)
Colonoscopy Discharge Instructions  Read the instructions outlined below and refer to this sheet in the next few weeks. These discharge instructions provide you with general information on caring for yourself after you leave the hospital. Your doctor may also give you specific instructions. While your treatment has been planned according to the most current medical practices available, unavoidable complications occasionally occur. If you have any problems or questions after discharge, call Dr. Gala Romney at [phone]. ACTIVITY  You may resume your regular activity, but move at a slower pace for the next 24 hours.   Take frequent rest periods for the next 24 hours.   Walking will help get rid of the air and reduce the bloated feeling in your belly (abdomen).   No driving for 24 hours (because of the medicine (anesthesia) used during the test).    Do not sign any important legal documents or operate any machinery for 24 hours (because of the anesthesia used during the test).  NUTRITION  Drink plenty of fluids.   You may resume your normal diet as instructed by your doctor.   Begin with a light meal and progress to your normal diet. Heavy or fried foods are harder to digest and may make you feel sick to your stomach (nauseated).   Avoid alcoholic beverages for 24 hours or as instructed.  MEDICATIONS  You may resume your normal medications unless your doctor tells you otherwise.  WHAT YOU CAN EXPECT TODAY  Some feelings of bloating in the abdomen.   Passage of more gas than usual.   Spotting of blood in your stool or on the toilet paper.  IF YOU HAD POLYPS REMOVED DURING THE COLONOSCOPY:  No aspirin products for 7 days or as instructed.   No alcohol for 7 days or as instructed.   Eat a soft diet for the next 24 hours.  FINDING OUT THE RESULTS OF YOUR TEST Not all test results are available during your visit. If your test results are not back during the visit, make an appointment  with your caregiver to find out the results. Do not assume everything is normal if you have not heard from your caregiver or the medical facility. It is important for you to follow up on all of your test results.  SEEK IMMEDIATE MEDICAL ATTENTION IF:  You have more than a spotting of blood in your stool.   Your belly is swollen (abdominal distention).   You are nauseated or vomiting.   You have a temperature over 101.   You have abdominal pain or discomfort that is severe or gets worse throughout the day.    Your colon appeared inflamed today.  Multiple biopsies taken.  Further recommendations to follow pending review of pathology report  Blood for C-reactive protein today        Monitored Anesthesia Care, Care After This sheet gives you information about how to care for yourself after your procedure. Your health care provider may also give you more specific instructions. If you have problems or questions, contact your health care provider. What can I expect after the procedure? After the procedure, it is common to have:  Tiredness.  Forgetfulness about what happened after the procedure.  Impaired judgment for important decisions.  Nausea or vomiting.  Some difficulty with balance. Follow these instructions at home: For the time period you were told by your health care provider:  Rest as needed.  Do not participate in activities where you could fall or become injured.  Do not  drive or use machinery.  Do not drink alcohol.  Do not take sleeping pills or medicines that cause drowsiness.  Do not make important decisions or sign legal documents.  Do not take care of children on your own.      Eating and drinking  Follow the diet that is recommended by your health care provider.  Drink enough fluid to keep your urine pale yellow.  If you vomit: ? Drink water, juice, or soup when you can drink without vomiting. ? Make sure you have little or no nausea  before eating solid foods. General instructions  Have a responsible adult stay with you for the time you are told. It is important to have someone help care for you until you are awake and alert.  Take over-the-counter and prescription medicines only as told by your health care provider.  If you have sleep apnea, surgery and certain medicines can increase your risk for breathing problems. Follow instructions from your health care provider about wearing your sleep device: ? Anytime you are sleeping, including during daytime naps. ? While taking prescription pain medicines, sleeping medicines, or medicines that make you drowsy.  Avoid smoking.  Keep all follow-up visits as told by your health care provider. This is important. Contact a health care provider if:  You keep feeling nauseous or you keep vomiting.  You feel light-headed.  You are still sleepy or having trouble with balance after 24 hours.  You develop a rash.  You have a fever.  You have redness or swelling around the IV site. Get help right away if:  You have trouble breathing.  You have new-onset confusion at home. Summary  For several hours after your procedure, you may feel tired. You may also be forgetful and have poor judgment.  Have a responsible adult stay with you for the time you are told. It is important to have someone help care for you until you are awake and alert.  Rest as told. Do not drive or operate machinery. Do not drink alcohol or take sleeping pills.  Get help right away if you have trouble breathing, or if you suddenly become confused. This information is not intended to replace advice given to you by your health care provider. Make sure you discuss any questions you have with your health care provider. Document Revised: 12/12/2019 Document Reviewed: 02/28/2019 Elsevier Patient Education  2021 [employer].

## 2020-05-21 NOTE — Transfer of Care (Signed)
Immediate Anesthesia Transfer of Care Note  Patient: Ricky Blackwell  Procedure(s) Performed: COLONOSCOPY WITH PROPOFOL (N/A ) BIOPSY  Patient Location: PACU  Anesthesia Type:General  Level of Consciousness: awake, alert  and oriented  Airway & Oxygen Therapy: Patient Spontanous Breathing  Post-op Assessment: Report given to RN and Post -op Vital signs reviewed and stable  Post vital signs: Reviewed and stable  Last Vitals:  Vitals Value Taken Time  BP    Temp    Pulse    Resp    SpO2      Last Pain:  Vitals:   05/21/20 1329  TempSrc:   PainSc: 8       Patients Stated Pain Goal: 7 (31/67/42 [id number])  Complications: No complications documented.

## 2020-05-21 NOTE — Interval H&P Note (Signed)
History and Physical Interval Note:  05/21/2020 1:19 PM  Ricky Blackwell  has presented today for surgery, with the diagnosis of CROHN'S DISEASE.  The various methods of treatment have been discussed with the patient and family. After consideration of risks, benefits and other options for treatment, the patient has consented to  Procedure(s) with comments: COLONOSCOPY WITH PROPOFOL (N/A) - 1:15pm, pt tested + in January, office sending report, <90 days as a surgical intervention.  The patient's history has been reviewed, patient examined, no change in status, stable for surgery.  I have reviewed the patient's chart and labs.  Questions were answered to the patient's satisfaction.     Ricky Blackwell  No change.  Surveillance ileo-colonoscopy per plan.  The risks, benefits, limitations, alternatives and imponderables have been reviewed with the patient. Questions have been answered. All parties are agreeable.

## 2020-05-21 NOTE — Anesthesia Postprocedure Evaluation (Signed)
Anesthesia Post Note  Patient: Ricky Blackwell  Procedure(s) Performed: COLONOSCOPY WITH PROPOFOL (N/A ) BIOPSY  Patient location during evaluation: Phase II Anesthesia Type: General Level of consciousness: awake and alert and oriented Pain management: pain level controlled Vital Signs Assessment: post-procedure vital signs reviewed and stable Respiratory status: spontaneous breathing, nonlabored ventilation and respiratory function stable Cardiovascular status: stable and blood pressure returned to baseline Postop Assessment: no apparent nausea or vomiting Anesthetic complications: no   No complications documented.   Last Vitals:  Vitals:   05/21/20 1241 05/21/20 1404  BP: 127/75 107/67  Pulse: 64 66  Resp: 15 16  Temp: 37.1 C 36.5 C  SpO2: 94% 98%    Last Pain:  Vitals:   05/21/20 1404  TempSrc: Oral  PainSc: [place]

## 2020-05-21 NOTE — Op Note (Signed)
[facility] Patient Name: Ricky Blackwell Procedure Date: 05/21/2020 1:21 PM MRN: [id number] Date of Birth: 08/22/1960 Attending MD: Norvel Richards , MD CSN: [id number] Age: 60 Admit Type: Outpatient Procedure:                Colonoscopy Indications:              High risk colon cancer surveillance: Crohn's                            colitis of 8 (or more) years duration with                            one-third (or more) of the colon involved Providers:                Norvel Richards, MD, Otis Peak B. Gwenlyn Perking Therapist, sports, RN,                            Suzan Garibaldi. Risa Grill, Technician Referring MD:              Medicines:                Propofol per Anesthesia Complications:            No immediate complications. Estimated Blood Loss:     Estimated blood loss was minimal. Procedure:                After obtaining informed consent, the colonoscope                            was passed under direct vision. Throughout the                            procedure, the patient's blood pressure, pulse, and                            oxygen saturations were monitored continuously. The                            CF-[id number] ([id number]) scope was introduced through                            the anus and advanced to the the terminal ileum. Scope In: 1:32:27 PM Scope Out: 1:56:49 PM Scope Withdrawal Time: 0 hours 20 minutes 43 seconds  Total Procedure Duration: 0 hours 24 minutes 22 seconds  Findings:      Digital rectal examination again revealed mild anal stenosis otherwise       negative.      Abnormal rectum with confluent granulation, friability mucosa, diffusely       with loss of most of the vascular pattern extending up to the area of       the splenic flexure; the transverse colon appeared to be relatively       spared and otherwise appeared normal. Likewise, the ascending colon       appeared to be slightly pale but otherwise normal in appearance; couple       of 5 mm aphthous type  ulcerations on the ileocecal valve. I intubated  the terminal ileum 5 cm - this segment of the GI tract appeared normal.      No focal nodular mass identified. The colon otherwise appeared       unremarkable. Frequent biopsies segmentally of the ascending,       transverse, descending, sigmoid and rectal segments taken separately.       Biopsy of the ulcerated ileocecal valve was also taken. Impression:               -Inflammatory bowel disease with skipped                            involvement of the colon endoscopically consistent                            with prior diagnosis of Crohn's colitis - status                            post segmental biopsy; normal appearing very distal                            terminal ileum. Ulcerated ileocecal valve.                            Clinically patient downplays symptoms.                            Endoscopically, he has fairly active colitis. I                            suspect he will need escalation in inflammatory                            bowel disease therapy. Moderate Sedation:      Moderate (conscious) sedation was personally administered by an       anesthesia professional. The following parameters were monitored: oxygen       saturation, heart rate, blood pressure, respiratory rate, EKG, adequacy       of pulmonary ventilation, and response to care. Recommendation:           - Patient has a contact number available for                            emergencies. The signs and symptoms of potential                            delayed complications were discussed with the                            patient. Return to normal activities tomorrow.                            Written discharge instructions were provided to the                            patient.                           -  Advance diet as tolerated. Avoid NSAIDs. Continue                            Lialda for now. Follow-up on pathology. C-reactive                             protein today. Further recommendations to follow. Procedure Code(s):        --- Professional ---                           [phone], Colonoscopy, flexible; diagnostic, including                            collection of specimen(s) by brushing or washing,                            when performed (separate procedure) Diagnosis Code(s):        --- Professional ---                           K50.10, Crohn's disease of large intestine without                            complications                           K63.89, Other specified diseases of intestine CPT copyright 2019 [employer]. All rights reserved. The codes documented in this report are preliminary and upon coder review may  be revised to meet current compliance requirements. Cristopher Estimable. Suriyah Vergara, MD Norvel Richards, MD 05/21/2020 2:36:30 PM This report has been signed electronically. Number of Addenda: 0

## 2020-05-25 LAB — SURGICAL PATHOLOGY

## 2020-05-26 ENCOUNTER — Encounter (HOSPITAL_COMMUNITY): Payer: Self-pay | Admitting: Internal Medicine

## 2020-05-26 ENCOUNTER — Encounter: Payer: Self-pay | Admitting: Internal Medicine

## 2020-06-01 ENCOUNTER — Telehealth: Payer: Self-pay | Admitting: Internal Medicine

## 2020-06-01 NOTE — Telephone Encounter (Signed)
[phone]  PATIENT CALLED ASKING ABOUT HIS COLONOSCOPY RESULTS, SAID YOU CAN LEAVE A MESSAGE OR LET HIS WIFE KNOW

## 2020-06-01 NOTE — Telephone Encounter (Signed)
Spoke with pts spouse. She was notified of results. Pt was scheduled an appointment and plan to discuss treatment options for IBS.

## 2020-06-30 ENCOUNTER — Emergency Department (HOSPITAL_COMMUNITY)
Admission: EM | Admit: 2020-06-30 | Discharge: 2020-06-30 | Disposition: A | Discharge status: Home / Self Care | Payer: Commercial Managed Care - PPO | Attending: Emergency Medicine | Admitting: Emergency Medicine

## 2020-06-30 ENCOUNTER — Other Ambulatory Visit: Payer: Self-pay

## 2020-06-30 ENCOUNTER — Emergency Department (HOSPITAL_COMMUNITY): Payer: Commercial Managed Care - PPO

## 2020-06-30 ENCOUNTER — Encounter (HOSPITAL_COMMUNITY): Payer: Self-pay | Admitting: Emergency Medicine

## 2020-06-30 DIAGNOSIS — I10 Essential (primary) hypertension: Secondary | ICD-10-CM | POA: Diagnosis not present

## 2020-06-30 DIAGNOSIS — Z79899 Other long term (current) drug therapy: Secondary | ICD-10-CM | POA: Insufficient documentation

## 2020-06-30 DIAGNOSIS — Z87891 Personal history of nicotine dependence: Secondary | ICD-10-CM | POA: Diagnosis not present

## 2020-06-30 DIAGNOSIS — Z8616 Personal history of COVID-19: Secondary | ICD-10-CM | POA: Diagnosis not present

## 2020-06-30 DIAGNOSIS — Z96652 Presence of left artificial knee joint: Secondary | ICD-10-CM | POA: Diagnosis not present

## 2020-06-30 DIAGNOSIS — R1032 Left lower quadrant pain: Secondary | ICD-10-CM | POA: Diagnosis not present

## 2020-06-30 DIAGNOSIS — R109 Unspecified abdominal pain: Secondary | ICD-10-CM

## 2020-06-30 LAB — URINALYSIS, ROUTINE W REFLEX MICROSCOPIC
Bilirubin Urine: NEGATIVE
Glucose, UA: NEGATIVE mg/dL
Hgb urine dipstick: NEGATIVE
Ketones, ur: NEGATIVE mg/dL
Leukocytes,Ua: NEGATIVE
Nitrite: NEGATIVE
Protein, ur: NEGATIVE mg/dL
Specific Gravity, Urine: 1.011 (ref 1.005–1.030)
pH: 8 (ref 5.0–8.0)

## 2020-06-30 LAB — CBC WITH DIFFERENTIAL/PLATELET
Abs Immature Granulocytes: 0.02 10*3/uL (ref 0.00–0.07)
Basophils Absolute: 0 10*3/uL (ref 0.0–0.1)
Basophils Relative: 0 %
Eosinophils Absolute: 0.1 10*3/uL (ref 0.0–0.5)
Eosinophils Relative: 2 %
HCT: 39.8 % (ref 39.0–52.0)
Hemoglobin: 13.1 g/dL (ref 13.0–17.0)
Immature Granulocytes: 0 %
Lymphocytes Relative: 17 %
Lymphs Abs: 1 10*3/uL (ref 0.7–4.0)
MCH: 30.9 pg (ref 26.0–34.0)
MCHC: 32.9 g/dL (ref 30.0–36.0)
MCV: 93.9 fL (ref 80.0–100.0)
Monocytes Absolute: 0.6 10*3/uL (ref 0.1–1.0)
Monocytes Relative: 11 %
Neutro Abs: 4 10*3/uL (ref 1.7–7.7)
Neutrophils Relative %: 70 %
Platelets: 213 10*3/uL (ref 150–400)
RBC: 4.24 MIL/uL (ref 4.22–5.81)
RDW: 13.2 % (ref 11.5–15.5)
WBC: 5.7 10*3/uL (ref 4.0–10.5)
nRBC: 0 % (ref 0.0–0.2)

## 2020-06-30 LAB — BASIC METABOLIC PANEL
Anion gap: 7 (ref 5–15)
BUN: 11 mg/dL (ref 6–20)
CO2: 27 mmol/L (ref 22–32)
Calcium: 8.6 mg/dL — ABNORMAL LOW (ref 8.9–10.3)
Chloride: 103 mmol/L (ref 98–111)
Creatinine, Ser: 0.85 mg/dL (ref 0.61–1.24)
GFR, Estimated: >60 mL/min (ref >60)
Glucose, Bld: 126 mg/dL — ABNORMAL HIGH (ref 70–99)
Potassium: 4.1 mmol/L (ref 3.5–5.1)
Sodium: 137 mmol/L (ref 135–145)

## 2020-06-30 LAB — HEPATIC FUNCTION PANEL
ALT: 18 U/L (ref 0–44)
AST: 23 U/L (ref 15–41)
Albumin: 3.8 g/dL (ref 3.5–5.0)
Alkaline Phosphatase: 44 U/L (ref 38–126)
Bilirubin, Direct: 0.2 mg/dL (ref 0.0–0.2)
Indirect Bilirubin: 0.5 mg/dL (ref 0.3–0.9)
Total Bilirubin: 0.7 mg/dL (ref 0.3–1.2)
Total Protein: 7 g/dL (ref 6.5–8.1)

## 2020-06-30 LAB — LIPASE, BLOOD: Lipase: 22 U/L (ref 11–51)

## 2020-06-30 MED ORDER — SODIUM CHLORIDE 0.9 % IV BOLUS
1000.0000 mL | Freq: Once | INTRAVENOUS | Status: AC
  Administered 2020-06-30: 1000 mL via INTRAVENOUS

## 2020-06-30 MED ORDER — ONDANSETRON HCL 4 MG/2ML IJ SOLN
4.0000 mg | Freq: Once | INTRAMUSCULAR | Status: AC
  Administered 2020-06-30: 4 mg via INTRAVENOUS
  Filled 2020-06-30: qty 2

## 2020-06-30 MED ORDER — OXYCODONE-ACETAMINOPHEN 5-325 MG PO TABS: 1.0000 | ORAL_TABLET | ORAL | 0 refills | Status: DC | PRN

## 2020-06-30 MED ORDER — MORPHINE SULFATE (PF) 4 MG/ML IV SOLN
4.0000 mg | Freq: Once | INTRAVENOUS | Status: AC
  Administered 2020-06-30: 4 mg via INTRAVENOUS
  Filled 2020-06-30: qty 1

## 2020-06-30 MED ORDER — HYDROMORPHONE HCL 1 MG/ML IJ SOLN
1.0000 mg | Freq: Once | INTRAMUSCULAR | Status: AC
  Administered 2020-06-30: 1 mg via INTRAVENOUS
  Filled 2020-06-30: qty 1

## 2020-06-30 NOTE — ED Provider Notes (Signed)
[facility] EMERGENCY DEPARTMENT Provider Note   CSN: [id number] Arrival date & time: 06/30/20  0246     History Chief Complaint  Patient presents with  . Flank Pain    KAYSHAUN POLANCO is a 60 y.o. male.  Patient with a history of Crohn's disease, irritable bowel syndrome, anal stenosis presenting with left-sided back pain for the past 4 days.  The pain is fairly constant but waxes and wanes in severity.  It radiates to his left lower abdomen for the past 2 to 3 days.  No nausea or vomiting.  No fever.  No pain with urination or blood in the urine.  He was seen in urgent care earlier in the day where he had an x-ray and told he was constipated.  They told him to get magnesium citrate which she did.  He had a 5 or 6 loose bowel movements afterwards with no improvement in his pain.  Patient did not feel he was constipated when he was at the clinic. Reports severe pain to his left flank that radiates to his lower groin and abdomen.  No testicular pain.  No chest pain or shortness of breath. Reports he had a colonoscopy a month ago but GI was told he had active Crohn's disease but was not given any change in medications. His pain today does not feel like his Crohn's disease  The history is provided by the patient.  Flank Pain Associated symptoms include abdominal pain. Pertinent negatives include no chest pain, no headaches and no shortness of breath.       Past Medical History:  Diagnosis Date  . Anal stenosis   . Arthritis   . Colitis   . COVID-19 04/2020  . Crohn's disease ([place]) 1993  . H/O hiatal hernia   . History of kidney stones   . Hypertension   . Irritable bowel syndrome   . Pneumonia    hx  . Rectal bleeding   . Unspecified constipation     Patient Active Problem List   Diagnosis Date Noted  . Chest pain 10/17/2019  . LLQ pain 09/19/2019  . Diarrhea 09/19/2019  . Crohn's disease of both small and large intestine without complication ([place]) 44/96/7591  .  Mucosal abnormality of colon   . Crohn's colitis ([place]) 09/09/2014  . Crohn's disease ([place]) 06/26/2008  . COLITIS 06/26/2008  . UNSPECIFIED CONSTIPATION 06/26/2008  . ANAL STENOSIS 06/26/2008  . RECTAL BLEEDING 06/26/2008  . IRRITABLE BOWEL SYNDROME, HX OF 06/26/2008    Past Surgical History:  Procedure Laterality Date  . ABDOMINAL HERNIA REPAIR  2010  . BACK SURGERY  2000  . BIOPSY  05/21/2020   Procedure: BIOPSY;  Surgeon: Daneil Dolin, MD;  Location: [facility];  Service: Endoscopy;;  . COLONOSCOPY  05/2004   Dr. Amedeo Plenty- [facility]- crohns   . COLONOSCOPY  08/20/2008   Dr. Gala Romney- normal distal rectum, proximal rectum, scattered 1-2 mm aphthous ulcers, these extended into the distal sigmoid.  Marland Kitchen COLONOSCOPY N/A 10/03/2014   [name]: findings c/w mildly active ileocolonic crohn's with scattered aphthous ulcers in the distal sigmoid colon, descending colon, ascending colon, terminal ileum. Transverse colon was normal.  . COLONOSCOPY WITH PROPOFOL N/A 05/25/2017   Procedure: COLONOSCOPY WITH PROPOFOL;  Surgeon: Daneil Dolin, MD;  scattered ulcers and erythema involving the rectum colon and terminal ileum consistent with Crohn's disease, one benign 4 mm sessile polyp, s/p frequent segmental biopsies.  Pathology with chronic mildly active ileitis and mildly active colitis  in ascending colon, sigmoid colon, and rectum.  Repeat in 2 years.  . COLONOSCOPY WITH PROPOFOL N/A 05/21/2020   Procedure: COLONOSCOPY WITH PROPOFOL;  Surgeon: Daneil Dolin, MD;  Location: [facility];  Service: Endoscopy;  Laterality: N/A;  1:15pm, pt tested + in January, office sending report, <90 days  . KNEE SURGERY Right 2010   Left Arthoscopic  . NECK SURGERY    . NECK SURGERY N/A 2005   metal plate in neck  . POLYPECTOMY  05/25/2017   Procedure: POLYPECTOMY;  Surgeon: Daneil Dolin, MD;  Location: [facility];  Service: Endoscopy;;  Sigmoid colon (CS)  . TOTAL KNEE ARTHROPLASTY Left 02/22/2013   Procedure:  LEFT TOTAL KNEE ARTHROPLASTY;  Surgeon: Yvette Rack., MD;  Location: [place];  Service: Orthopedics;  Laterality: Left;       Family History  Problem Relation Age of Onset  . Diabetes Mother   . Heart failure Mother   . Heart failure Father   . Colon polyps Father   . Cancer - Lung Father   . COPD Sister   . Diabetes type II Sister   . Hypertension Sister   . Sleep apnea Sister   . Arthritis Sister   . Colitis Sister        Maybe?  . Irregular heart beat Brother   . Alcoholism Brother   . Arthritis Brother   . Colon cancer Neg Hx   . Inflammatory bowel disease Neg Hx     Social History   Tobacco Use  . Smoking status: Former Smoker    Packs/day: 2.00    Years: 10.00    Pack years: 20.00    Types: Cigarettes    Quit date: 02/13/1987    Years since quitting: 33.4  . Smokeless tobacco: Never Used  Vaping Use  . Vaping Use: Never used  Substance Use Topics  . Alcohol use: No  . Drug use: No    Home Medications Prior to Admission medications   Medication Sig Start Date End Date Taking? Authorizing Provider  cyclobenzaprine (FLEXERIL) 10 MG tablet Take 10 mg by mouth 3 (three) times daily as needed for muscle spasms.    [provider]  hydrochlorothiazide (MICROZIDE) 12.5 MG capsule Take 12.5 mg by mouth daily.    [provider]  lisinopril (PRINIVIL,ZESTRIL) 10 MG tablet Take 10 mg by mouth daily.  03/23/17   [provider]  mesalamine (LIALDA) 1.2 g EC tablet TAKE 4 (FOUR) TABLETS EVERY MORNING Patient taking differently: Take 4.8 g by mouth daily with breakfast. TAKE 4 (FOUR) TABLETS EVERY MORNING 01/20/20   Carlis Stable, NP  metFORMIN (GLUCOPHAGE) 500 MG tablet Take 500 mg by mouth every evening.    [provider]  oxyCODONE-acetaminophen (PERCOCET/ROXICET) 5-325 MG tablet Take 1 tablet by mouth every 4 (four) hours as needed. Patient taking differently: Take 1 tablet by mouth every 4 (four) hours as needed for severe pain.  02/25/20   Evalee Jefferson, PA-C  tamsulosin (FLOMAX) 0.4 MG CAPS capsule Take 0.4 mg by mouth daily after supper. 03/28/17   [provider]    Allergies    Toradol [ketorolac tromethamine]  Review of Systems   Review of Systems  Constitutional: Negative for activity change, appetite change, fatigue and fever.  HENT: Negative for congestion and rhinorrhea.   Respiratory: Negative for chest tightness and shortness of breath.   Cardiovascular: Negative for chest pain.  Gastrointestinal: Positive for abdominal pain. Negative for blood  in stool, nausea and vomiting.  Genitourinary: Positive for flank pain.  Musculoskeletal: Positive for back pain. Negative for myalgias and neck pain.  Skin: Negative for rash.  Neurological: Negative for weakness and headaches.    all other systems are negative except as noted in the HPI and PMH.    Physical Exam Updated Vital Signs BP (!) 158/71   Pulse 64   Temp 97.8 F (36.6 C) (Oral)   Resp 17   Ht 6' (1.829 m)   Wt (!) 142.9 kg   SpO2 98%   BMI 42.72 kg/m   Physical Exam Vitals and nursing note reviewed.  Constitutional:      General: He is in acute distress.     Appearance: He is well-developed.     Comments: Uncomfortable  HENT:     Head: Normocephalic and atraumatic.     Mouth/Throat:     Pharynx: No oropharyngeal exudate.  Eyes:     Conjunctiva/sclera: Conjunctivae normal.     Pupils: Pupils are equal, round, and reactive to light.  Neck:     Comments: No meningismus. Cardiovascular:     Rate and Rhythm: Normal rate and regular rhythm.     Heart sounds: Normal heart sounds. No murmur heard.   Pulmonary:     Effort: Pulmonary effort is normal. No respiratory distress.     Breath sounds: Normal breath sounds.  Abdominal:     Palpations: Abdomen is soft.     Tenderness: There is abdominal tenderness. There is no guarding or rebound.     Comments: Left lower quadrant tenderness, no guarding or rebound   Genitourinary:    Comments: No testicular tenderness Musculoskeletal:        General: Tenderness present. Normal range of motion.     Cervical back: Normal range of motion and neck supple.     Comments: Left CVA tenderness  Skin:    General: Skin is warm.  Neurological:     Mental Status: He is alert and oriented to person, place, and time.     Cranial Nerves: No cranial nerve deficit.     Motor: No abnormal muscle tone.     Coordination: Coordination normal.     Comments: No ataxia on finger to nose bilaterally. No pronator drift. 5/5 strength throughout. CN 2-12 intact.Equal grip strength. Sensation intact.   Psychiatric:        Behavior: Behavior normal.     ED Results / Procedures / Treatments   Labs (all labs ordered are listed, but only abnormal results are displayed) Labs Reviewed  BASIC METABOLIC PANEL - Abnormal; Notable for the following components:      Result Value   Glucose, Bld 126 (*)    Calcium 8.6 (*)    All other components within normal limits  URINALYSIS, ROUTINE W REFLEX MICROSCOPIC  CBC WITH DIFFERENTIAL/PLATELET  HEPATIC FUNCTION PANEL  LIPASE, BLOOD    EKG None  Radiology CT Renal Stone Study  Result Date: 06/30/2020 CLINICAL DATA:  60 year old male with left flank pain radiating to the lower abdomen for 4 days. History of Crohn disease. EXAM: CT ABDOMEN AND PELVIS WITHOUT CONTRAST TECHNIQUE: Multidetector CT imaging of the abdomen and pelvis was performed following the standard protocol without IV contrast. COMPARISON:  CT Abdomen and Pelvis 09/19/2019. FINDINGS: Lower chest: Stable mild lung base atelectasis or scarring. No cardiomegaly. No pericardial or pleural effusion. Hepatobiliary: Probable hepatic steatosis but otherwise negative noncontrast liver and gallbladder. Pancreas: Negative. Spleen: Negative. Adrenals/Urinary Tract: Normal adrenal glands.  Punctate right nephrolithiasis. Noncontrast kidneys otherwise appears stable and negative; small  benign left renal midpole cyst again suspected. No hydronephrosis. The left ureter is decompressed and negative to the bladder. Mildly distended but otherwise unremarkable urinary bladder today. Incidental pelvic phleboliths. Stomach/Bowel: Decompressed rectum with stable small perirectal lymph nodes since last year (series 2, images 75 and 76). The sigmoid colon remains decompressed similar to last year. The mid sigmoid is featureless on series 2, image 55, but with no convincing mesenteric stranding. Mild diverticulosis of the descending colon. Upstream retained fluid and stool elsewhere. Normal appendix on series 2, image 62. No convincing acute large bowel inflammation. Terminal ileum is within normal limits. No dilated or inflamed small bowel loops. Decompressed stomach. Small gastric cardia lipoma suspected and stable on series 2, image 12, likely inconsequential. Negative duodenum. No free air, free fluid, or mesenteric stranding. Vascular/Lymphatic: Mild Aortoiliac calcified atherosclerosis. Normal caliber abdominal aorta. Vascular patency is not evaluated in the absence of IV contrast. Small lymph nodes in the small bowel and distal large bowel mesentery similar to those about the rectum, stable and likely postinflammatory in this clinical setting. Reproductive: Small fat containing inguinal hernias suggested on series 2, image 87, otherwise negative. Other: No pelvic free fluid. Musculoskeletal: Chronic lumbar disc and endplate degeneration with vacuum disc. No acute osseous abnormality identified. IMPRESSION: 1. Featureless appearance of the sigmoid colon which could be the sequelae of inflammatory bowel disease in this setting. But no active bowel inflammation is identified. 2. Punctate nephrolithiasis with no obstructive uropathy. 3. No other acute or inflammatory process identified in the non-contrast abdomen or pelvis. Probable hepatic steatosis.   Mild aortic atherosclerosis. Electronically Signed    By: Genevie Ann M.D.   On: 06/30/2020 06:22    Procedures Procedures   Medications Ordered in ED Medications  sodium chloride 0.9 % bolus 1,000 mL (has no administration in time range)  ondansetron (ZOFRAN) injection 4 mg (has no administration in time range)  morphine 4 MG/ML injection 4 mg (has no administration in time range)    ED Course  I have reviewed the triage vital signs and the nursing notes.  Pertinent labs & imaging results that were available during my care of the patient were reviewed by me and considered in my medical decision making (see chart for details).    MDM Rules/Calculators/A&P                         Left-sided flank pain and abdominal pain concerning for kidney stone.  No fever or vomiting.  Patient drove himself to the ED.  Believes that he can get a ride home.  Cannot get Toradol due to IBD.  Urine is negative. Creatinine is normal.  CT scan obtained to look for structural uropathy or kidney stone. CT shows no ureteral stones.  There are stones in the kidneys but none of the bladder or ureter No AAA.  Patient still with significant pain. D/w him that he may have passed a kidney stone. Do not feel that he is constipated.  Additional LFTs and lipase added.   More pain control given.  Patient will need reassessment for pain control at shift change.  Dr. Regenia Skeeter to assume care.   Final Clinical Impression(s) / ED Diagnoses Final diagnoses:  Left flank pain    Rx / DC Orders ED Discharge Orders    None       Rancour, Annie Main, MD 06/30/20 [phone]

## 2020-06-30 NOTE — Discharge Instructions (Signed)
As we discussed we suspect you passed a kidney stone.  Your urine today shows no infection or blood. You should take the pain and nausea medications as prescribed and follow-up with your doctor.  Return to the ED with worsening pain, fever, vomiting, not able to urinate or any other concerns.

## 2020-06-30 NOTE — ED Provider Notes (Signed)
Care transferred to me.  Patient's is feeling better.  His vital signs are unremarkable.  At this point, no clear indication of why he is hurting but I think he is stable for discharge home for PCP follow-up.  Will give short course of pain medicine.  We discussed return precautions.   Sherwood Gambler, MD 06/30/20 0800

## 2020-06-30 NOTE — ED Triage Notes (Signed)
Pt c/o left flank pain that radiates to lower abd x 4 days. Pt was seen yesterday at urgent care for the same. Pt was diagnosed with constipation and since then has had 6 bms and pain is worse.

## 2020-07-02 ENCOUNTER — Ambulatory Visit: Payer: Commercial Managed Care - PPO | Admitting: Gastroenterology

## 2020-07-07 NOTE — Progress Notes (Unsigned)
Referring Provider: Adaline Sill, NP Primary Care Physician:  Adaline Sill, NP Primary GI Physician: Dr. Gala Romney  No chief complaint on file.   HPI:   Ricky Blackwell is a 60 y.o. male presenting today for follow-up of Crohn's with need to discuss possibly increasing therapy. History of ileocolonic Crohn's disease, diagnosed in 1993 and known anal stenosis, currently maintained on Lialda 4.8 g daily. Last seen in our office in January 2022 to discuss scheduling surveillance colonoscopy. Clinically doing well at that time.   Colonoscopy 05/21/2020: Mild anal stenosis on DRE, inflammatory bowel disease with skipped involvement of the colon endoscopically consistent with prior diagnosis of Crohn's colitis s/p segmental biopsy, normal-appearing very distal TI.  Ulcerated ileocecal valve.  At that time, patient downplayed any significant symptoms that he had fairly active colitis on endoscopy.  Suspected need for escalation in therapy.  Ileal valve biopsy with mildly active chronic colitis, ascending and transverse colon biopsies benign, descending colon biopsy with moderately active chronic colitis, sigmoid colon biopsy with severely active chronic colitis with ulceration, rectal biopsy benign.  CRP normal at 0.6.  Recommended follow-up in 2-3 weeks to discuss increasing treatment for IBD.  Patient was seen in the emergency room 06/30/2020 for left-sided back pain radiating to left lower abdomen for the last 2-3 days.  CBC, BMP, HFP, lipase are reassuring.  No UTI.  CT renal study with featureless appearance of the sigmoid colon which could be sequela of inflammatory bowel disease in this setting, no active bowel inflammation identified.  Punctate right nephrolithiasis with no obstructive uropathy.  No other acute or inflammatory process.  He received IV fluids, Zofran, and IV pain medication.  He had some improvement in his pain and was stable for discharge.  He was provided with short  course of pain medication at discharge.  Today:    Consider starting budesonide to bridge to Humira.  Anti-TNF can increase risk of infections, ?skin cancer and lymphoma,   Past Medical History:  Diagnosis Date  . Anal stenosis   . Arthritis   . Colitis   . COVID-19 04/2020  . Crohn's disease ([place]) 1993  . H/O hiatal hernia   . History of kidney stones   . Hypertension   . Irritable bowel syndrome   . Pneumonia    hx  . Rectal bleeding   . Unspecified constipation     Past Surgical History:  Procedure Laterality Date  . ABDOMINAL HERNIA REPAIR  2010  . BACK SURGERY  2000  . BIOPSY  05/21/2020   Procedure: BIOPSY;  Surgeon: Daneil Dolin, MD;  [facility];  Service: Endoscopy;;  . COLONOSCOPY  05/2004   Dr. Amedeo Plenty- [facility]- crohns   . COLONOSCOPY  08/20/2008   Dr. Gala Romney- normal distal rectum, proximal rectum, scattered 1-2 mm aphthous ulcers, these extended into the distal sigmoid.  Marland Kitchen COLONOSCOPY N/A 10/03/2014   [name]: findings c/w mildly active ileocolonic crohn's with scattered aphthous ulcers in the distal sigmoid colon, descending colon, ascending colon, terminal ileum. Transverse colon was normal.  . COLONOSCOPY WITH PROPOFOL N/A 05/25/2017   Procedure: COLONOSCOPY WITH PROPOFOL;  Surgeon: Daneil Dolin, MD;  scattered ulcers and erythema involving the rectum colon and terminal ileum consistent with Crohn's disease, one benign 4 mm sessile polyp, s/p frequent segmental biopsies.  Pathology with chronic mildly active ileitis and mildly active colitis in ascending colon, sigmoid colon, and rectum.  Repeat in 2 years.  . COLONOSCOPY WITH PROPOFOL  N/A 05/21/2020   Procedure: COLONOSCOPY WITH PROPOFOL;  Surgeon: Daneil Dolin, MD;  [facility];  Service: Endoscopy;  Laterality: N/A;  1:15pm, pt tested + in January, office sending report, <90 days  . KNEE SURGERY Right 2010   Left Arthoscopic  . NECK SURGERY    . NECK SURGERY N/A 2005   metal plate  in neck  . POLYPECTOMY  05/25/2017   Procedure: POLYPECTOMY;  Surgeon: Daneil Dolin, MD;  [facility];  Service: Endoscopy;;  Sigmoid colon (CS)  . TOTAL KNEE ARTHROPLASTY Left 02/22/2013   Procedure: LEFT TOTAL KNEE ARTHROPLASTY;  Surgeon: Yvette Rack., MD;  Location: [place];  Service: Orthopedics;  Laterality: Left;    Current Outpatient Medications  Medication Sig Dispense Refill  . cyclobenzaprine (FLEXERIL) 10 MG tablet Take 10 mg by mouth 3 (three) times daily as needed for muscle spasms.    . hydrochlorothiazide (MICROZIDE) 12.5 MG capsule Take 12.5 mg by mouth daily.    Marland Kitchen lisinopril (PRINIVIL,ZESTRIL) 10 MG tablet Take 10 mg by mouth daily.   0  . mesalamine (LIALDA) 1.2 g EC tablet TAKE 4 (FOUR) TABLETS EVERY MORNING (Patient taking differently: Take 4.8 g by mouth daily with breakfast. TAKE 4 (FOUR) TABLETS EVERY MORNING) 120 tablet 11  . metFORMIN (GLUCOPHAGE) 500 MG tablet Take 500 mg by mouth every evening.    Marland Kitchen oxyCODONE-acetaminophen (PERCOCET) 5-325 MG tablet Take 1 tablet by mouth every 4 (four) hours as needed for severe pain. 10 tablet 0  . tamsulosin (FLOMAX) 0.4 MG CAPS capsule Take 0.4 mg by mouth daily after supper.  10   No current facility-administered medications for this visit.    Allergies as of 07/08/2020 - Review Complete 06/30/2020  Allergen Reaction Noted  . Toradol [ketorolac tromethamine]  02/25/2020    Family History  Problem Relation Age of Onset  . Diabetes Mother   . Heart failure Mother   . Heart failure Father   . Colon polyps Father   . Cancer - Lung Father   . COPD Sister   . Diabetes type II Sister   . Hypertension Sister   . Sleep apnea Sister   . Arthritis Sister   . Colitis Sister        Maybe?  . Irregular heart beat Brother   . Alcoholism Brother   . Arthritis Brother   . Colon cancer Neg Hx   . Inflammatory bowel disease Neg Hx     Social History   Socioeconomic History  . Marital status: Married     Spouse name: Not on file  . Number of children: Not on file  . Years of education: Not on file  . Highest education level: Not on file  Occupational History  . Not on file  Tobacco Use  . Smoking status: Former Smoker    Packs/day: 2.00    Years: 10.00    Pack years: 20.00    Types: Cigarettes    Quit date: 02/13/1987    Years since quitting: 33.4  . Smokeless tobacco: Never Used  Vaping Use  . Vaping Use: Never used  Substance and Sexual Activity  . Alcohol use: No  . Drug use: No  . Sexual activity: Yes    Birth control/protection: None  Other Topics Concern  . Not on file  Social History Narrative  . Not on file   Social Determinants of Health   Financial Resource Strain: Not on file  Food Insecurity: Not  on file  Transportation Needs: Not on file  Physical Activity: Not on file  Stress: Not on file  Social Connections: Not on file    Review of Systems: Gen: Denies fever, chills, anorexia. Denies fatigue, weakness, weight loss.  CV: Denies chest pain, palpitations, syncope, peripheral edema, and claudication. Resp: Denies dyspnea at rest, cough, wheezing, coughing up blood, and pleurisy. GI: Denies vomiting blood, jaundice, and fecal incontinence.   Denies dysphagia or odynophagia. Derm: Denies rash, itching, dry skin Psych: Denies depression, anxiety, memory loss, confusion. No homicidal or suicidal ideation.  Heme: Denies bruising, bleeding, and enlarged lymph nodes.  Physical Exam: There were no vitals taken for this visit. General:   Alert and oriented. No distress noted. Pleasant and cooperative.  Head:  Normocephalic and atraumatic. Eyes:  Conjuctiva clear without scleral icterus. Mouth:  Oral mucosa pink and moist. Good dentition. No lesions. Heart:  S1, S2 present without murmurs appreciated. Lungs:  Clear to auscultation bilaterally. No wheezes, rales, or rhonchi. No distress.  Abdomen:  +BS, soft, non-tender and non-distended. No rebound or guarding.  No HSM or masses noted. Msk:  Symmetrical without gross deformities. Normal posture. Extremities:  Without edema. Neurologic:  Alert and  oriented x4 Psych:  Alert and cooperative. Normal mood and affect.

## 2020-07-08 ENCOUNTER — Ambulatory Visit: Payer: Commercial Managed Care - PPO | Admitting: Gastroenterology

## 2020-07-08 ENCOUNTER — Encounter: Payer: Self-pay | Admitting: Internal Medicine

## 2020-07-09 ENCOUNTER — Telehealth: Payer: Self-pay | Admitting: Internal Medicine

## 2020-07-09 ENCOUNTER — Encounter: Payer: Self-pay | Admitting: Internal Medicine

## 2020-07-09 NOTE — Telephone Encounter (Signed)
Lmom, waiting on a return call.  

## 2020-07-09 NOTE — Telephone Encounter (Signed)
Seeing this after hours.   Patient is going to need to be seen before May due to active Crohn's disease. Recommend he see the next available provider. Looks like next week, there is one opening with Magda Paganini on 4/5 at [place].   I will plan to call patient tomorrow to discuss his symptoms to see if I can provide any additional recommendations before his OV.

## 2020-07-09 NOTE — Telephone Encounter (Signed)
Please call patient, he has been having abd pain.

## 2020-07-09 NOTE — Telephone Encounter (Signed)
Please call patient back, he returned call

## 2020-07-09 NOTE — Telephone Encounter (Signed)
Pt missed his apt yesterday and thought it was today. Pt is concerned because he was suppose to discuss increasing treatment for IBD. Pt was r/s to May 2022. Please advise if there is anything pt needs to do prior to his apt.

## 2020-07-10 ENCOUNTER — Other Ambulatory Visit: Payer: Self-pay | Admitting: Gastroenterology

## 2020-07-10 DIAGNOSIS — R197 Diarrhea, unspecified: Secondary | ICD-10-CM

## 2020-07-10 DIAGNOSIS — K5080 Crohn's disease of both small and large intestine without complications: Secondary | ICD-10-CM

## 2020-07-10 NOTE — Telephone Encounter (Signed)
Noted  

## 2020-07-10 NOTE — Telephone Encounter (Signed)
Patient was added to cancellation list

## 2020-07-10 NOTE — Telephone Encounter (Signed)
Pt called back and can't do the day given on 4/5 due to him having to take his spouse to [facility] for a TCS. Pt will be placed on a cancellation list. Lanetta Inch is aware.

## 2020-07-10 NOTE — Telephone Encounter (Signed)
Spoke with patient.  He reports few weeks ago, he was struggling with left-sided abdominal pain and bloody stools.  He is actually feeling much better now with no significant abdominal pain or bright red blood per rectum.  He does tend to have 5-6 loose to watery BMs daily, but can have up to 7-10 BMs per day at times.  No fever chills, nausea, or vomiting.  Suspect symptoms are likely secondary to active Crohn's colitis.  We will update stool studies to ensure no infectious diarrhea.  As we are likely moving toward a biologic, I am going to order labs to evaluate for hepatitis A, B, C, QuantiFERON TB Gold, varicella-zoster IgG.  Will also update ESR and CRP.  Lab orders have been placed.  Patient states he will have labs completed Monday at [facility].

## 2020-07-10 NOTE — Telephone Encounter (Signed)
Called pt. He is going to see if he can use that date. If he can't, we'll have to put him on a cancellation list.

## 2020-07-10 NOTE — Telephone Encounter (Signed)
Called patient's cell. No answer.  Unable to leave voicemail as voicemail was full.  Called home number.  Patient's wife answered.  Patient is outside working in the yard and she is unable to find him.  Wife states he has been having trouble with left-sided abdominal pain though it is not as bad as it had been.  He has been seen in the ED a couple of times and was told he may have a blockage then later was told he had some kidney stones that might be causing his problems.  He had been having some trouble with constipation, but his bowels are moving well at this time.  Wife states she thinks he may have had a little blood in his stools.  I have reviewed patient's colonoscopy completed in February 2022.  He had active relatively severe colitis consistent with Crohn's.  Ileocecal valve biopsy with mildly active chronic colitis, descending colon biopsy with moderately active chronic colitis, and sigmoid biopsy with severely active chronic colitis with ulceration.  I also reviewed recent emergency room evaluation 06/30/2020.  CBC, CMP, HFP, lipase all reassuring.  UA negative.  CT A/P without contrast with featureless appearance of the sigmoid colon which could be sequela of inflammatory bowel disease in the setting, no active bowel inflammation is identified.  Punctate right nephrolithiasis, small benign left renal midpole cyst, no hydronephrosis.   Would like to talk with patient directly before any additional recommendations are made.  Consider starting budesonide 9 mg daily in light of active Crohn's colitis.  He will also need additional labs before we are able to consider a biologic.  We can arrange labs prior to upcoming office visit.  I advised wife that I would reach back out to them around 4:30 PM.  She will try to locate her husband outside so he is available at 4:30 PM.

## 2020-07-15 LAB — QUANTIFERON-TB GOLD PLUS
Mitogen-NIL: >10 IU/mL
NIL: 0.03 IU/mL
QuantiFERON-TB Gold Plus: NEGATIVE
TB1-NIL: <0 IU/mL
TB2-NIL: 0 IU/mL

## 2020-07-15 LAB — HEPATITIS C ANTIBODY
Hepatitis C Ab: NONREACTIVE
SIGNAL TO CUT-OFF: 0.01 (ref <1.00)

## 2020-07-15 LAB — GASTROINTESTINAL PATHOGEN PANEL PCR
C. difficile Tox A/B, PCR: DETECTED — AB
Campylobacter, PCR: NOT DETECTED
Cryptosporidium, PCR: NOT DETECTED
E coli (ETEC) LT/ST PCR: NOT DETECTED
E coli (STEC) stx1/stx2, PCR: NOT DETECTED
E coli 0157, PCR: NOT DETECTED
Giardia lamblia, PCR: NOT DETECTED
Norovirus, PCR: NOT DETECTED
Rotavirus A, PCR: NOT DETECTED
Salmonella, PCR: NOT DETECTED
Shigella, PCR: NOT DETECTED

## 2020-07-15 LAB — SEDIMENTATION RATE: Sed Rate: 9 mm/h (ref 0–20)

## 2020-07-15 LAB — C-REACTIVE PROTEIN: CRP: 6.2 mg/L (ref <8.0)

## 2020-07-15 LAB — VARICELLA ZOSTER ANTIBODY, IGG: Varicella IgG: 1483 index

## 2020-07-15 LAB — HEPATITIS A ANTIBODY, TOTAL: Hepatitis A AB,Total: NONREACTIVE

## 2020-07-15 LAB — C. DIFFICILE GDH AND TOXIN A/B
GDH ANTIGEN: NOT DETECTED
MICRO NUMBER:: 11734669
SPECIMEN QUALITY:: ADEQUATE
TOXIN A AND B: NOT DETECTED

## 2020-07-15 LAB — HEPATITIS B SURFACE ANTIBODY,QUALITATIVE: Hep B S Ab: NONREACTIVE

## 2020-07-15 LAB — HEPATITIS B SURFACE ANTIGEN: Hepatitis B Surface Ag: NONREACTIVE

## 2020-07-15 LAB — HEPATITIS B CORE ANTIBODY, TOTAL: Hep B Core Total Ab: NONREACTIVE

## 2020-07-16 ENCOUNTER — Other Ambulatory Visit: Payer: Self-pay | Admitting: Gastroenterology

## 2020-07-16 DIAGNOSIS — A498 Other bacterial infections of unspecified site: Secondary | ICD-10-CM

## 2020-07-16 MED ORDER — VANCOMYCIN HCL 125 MG PO CAPS
125.0000 mg | ORAL_CAPSULE | Freq: Four times a day (QID) | ORAL | 0 refills | Status: DC
Start: 2020-07-16 — End: 2020-08-17

## 2020-08-03 ENCOUNTER — Other Ambulatory Visit: Payer: Self-pay | Admitting: Gastroenterology

## 2020-08-03 DIAGNOSIS — K5080 Crohn's disease of both small and large intestine without complications: Secondary | ICD-10-CM

## 2020-08-03 MED ORDER — BUDESONIDE 3 MG PO CPEP: 9.0000 mg | ORAL_CAPSULE | Freq: Every day | ORAL | 1 refills | Status: DC

## 2020-08-15 NOTE — Progress Notes (Signed)
Referring Provider: Adaline Sill, NP Primary Care Physician:  Adaline Sill, NP Primary GI Physician: Dr. Gala Romney  Chief Complaint  Patient presents with  . Crohn's Disease    F/u. Reports he has a place on butt that looks like ? MRSA. Wants it looked at.   . Diarrhea    Very loose, watery stool x few days, bloody, 8-15 times a day going to [facility]    HPI:   Ricky Blackwell is a 60 y.o. male with a history of ileocolonic Crohn's disease, diagnosed in 25.Also with history of anal stenosis previously seen by Dr. Morton Stall at [facility]. Has been maintained on Lialda 4.8g daily chronically. He is presenting today for follow-up and to discuss advancing his Crohn's therapy s/p colonoscopy February 2022 revealing IBD with skipped involvement of the colon endoscopically consistent with Crohn's colitis s/p segmental biopsy.  Normal-appearing distal TI, ulcerated ileocecal valve.  Pathology with mildly active chronic colitis in the ileocecal valve, moderately active chronic colitis in the descending colon, severely active chronic colitis with ulceration in the sigmoid colon.   Following his colonoscopy, we later complete additional blood work/stool studies in early April 2022 due to abdominal pain and increased diarrhea with some rectal bleeding.  Also completed pretreatment labs before initiating biologic therapy.  Labs remarkable for C difficile toxin A/B by PCR.  He was treated with a course of vancomycin 4 times daily x10 days.  Progress report on 08/03/2020.  Patient reported he had been having up to 20 bowel movements per day when initially diagnosed with C. difficile.  After completing vancomycin, he was down to about 7 bowel movements per day which was close to his baseline.  Doubted persistent C. difficile.  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  He was started on Entocort 9 mg daily.  Advised to monitor for any worsening diarrhea and let me know  if this occurs immediately.  Today:  Taking Entocort 9 mg daily x 2 weeks. 8-15 BMs per day. 19 BMs on Friday. Bristol 5-7. Associated rectal bleeding- early in the morning, doesn't last all day. Little blood in the toilet water but mostly on toilet tissue. . BMs are all small in quantity. Starts with a little splatter. Then passes small pieces. By the end of the day, he can have 1-2 formed stools, but this is not routine. Stools have a foul smell. Took imodium yesterday.   BMs feel incomplete. Tenesmus. Urgency. Has had a couple of accidents.    Stomach is growling and turning. Mild RLQ and LLQ abdominal pain, nothing significant. No pain currently.  About 2-4 days out of the week. Doesn't last long. Can't last an hour or two and resolve.  Has not identified a trigger though he reports pain can start after a bowel movement.     No weight loss. Occasional nausea without vomiting. No fever or chills.   Has a place on his buttock. Looks like a boil. Noticed it on Friday.  Tender and red.  Has not drained.  Also reports central chest discomfort. Can be constant. Some association with stress/anxiety. Not sure about activity.  No associated shortness of breath.  Discussed with PCP a while ago, but wasn't as frequent at that time. Rare GERD. Admits to solid food dysphagia.   Past Medical History:  Diagnosis Date  . Anal stenosis   . Arthritis   . Colitis   . COVID-19 04/2020  . Crohn's disease ([place]) 1993  .  H/O hiatal hernia   . History of kidney stones   . Hypertension   . Irritable bowel syndrome   . Pneumonia    hx  . Rectal bleeding   . Unspecified constipation     Past Surgical History:  Procedure Laterality Date  . ABDOMINAL HERNIA REPAIR  2010  . BACK SURGERY  2000  . BIOPSY  05/21/2020   Procedure: BIOPSY;  Surgeon: Daneil Dolin, MD;  [facility];  Service: Endoscopy;;  . COLONOSCOPY  05/2004   Dr. Amedeo Plenty- [facility]- crohns   . COLONOSCOPY  08/20/2008   Dr.  Gala Romney- normal distal rectum, proximal rectum, scattered 1-2 mm aphthous ulcers, these extended into the distal sigmoid.  Marland Kitchen COLONOSCOPY N/A 10/03/2014   [name]: findings c/w mildly active ileocolonic crohn's with scattered aphthous ulcers in the distal sigmoid colon, descending colon, ascending colon, terminal ileum. Transverse colon was normal.  . COLONOSCOPY WITH PROPOFOL N/A 05/25/2017   Procedure: COLONOSCOPY WITH PROPOFOL;  Surgeon: Daneil Dolin, MD;  scattered ulcers and erythema involving the rectum colon and terminal ileum consistent with Crohn's disease, one benign 4 mm sessile polyp, s/p frequent segmental biopsies.  Pathology with chronic mildly active ileitis and mildly active colitis in ascending colon, sigmoid colon, and rectum.  Repeat in 2 years.  . COLONOSCOPY WITH PROPOFOL N/A 05/21/2020    Surgeon: Daneil Dolin, MD;  IBD with skipped involvement of the colon endoscopically consistent with Crohn's colitis s/p segmental biopsy.  Normal-appearing distal TI, ulcerated ileocecal valve.  Pathology with mildly active chronic colitis in the ileocecal valve, moderately active chronic colitis in the descending colon, severely active chronic colitis with ulceration in the sigmoid colon.   Marland Kitchen KNEE SURGERY Right 2010   Left Arthoscopic  . NECK SURGERY    . NECK SURGERY N/A 2005   metal plate in neck  . POLYPECTOMY  05/25/2017   Procedure: POLYPECTOMY;  Surgeon: Daneil Dolin, MD;  [facility];  Service: Endoscopy;;  Sigmoid colon (CS)  . TOTAL KNEE ARTHROPLASTY Left 02/22/2013   Procedure: LEFT TOTAL KNEE ARTHROPLASTY;  Surgeon: Yvette Rack., MD;  Location: [facility];  Service: Orthopedics;  Laterality: Left;    Current Outpatient Medications  Medication Sig Dispense Refill  . budesonide (ENTOCORT EC) 3 MG 24 hr capsule Take 3 capsules (9 mg total) by mouth daily. 90 capsule 1  . cyclobenzaprine (FLEXERIL) 10 MG tablet Take 10 mg by mouth 3 (three) times daily as needed for  muscle spasms.    . hydrochlorothiazide (MICROZIDE) 12.5 MG capsule Take 12.5 mg by mouth daily.    Marland Kitchen lisinopril (PRINIVIL,ZESTRIL) 10 MG tablet Take 10 mg by mouth daily.   0  . metFORMIN (GLUCOPHAGE) 500 MG tablet Take 500 mg by mouth every evening.    Marland Kitchen oxyCODONE-acetaminophen (PERCOCET) 5-325 MG tablet Take 1 tablet by mouth every 4 (four) hours as needed for severe pain. 10 tablet 0  . tamsulosin (FLOMAX) 0.4 MG CAPS capsule Take 0.4 mg by mouth daily after supper.  10   No current facility-administered medications for this visit.    Allergies as of 08/17/2020 - Review Complete 08/17/2020  Allergen Reaction Noted  . Toradol [ketorolac tromethamine]  02/25/2020    Family History  Problem Relation Age of Onset  . Diabetes Mother   . Heart failure Mother   . Heart failure Father   . Colon polyps Father   . Cancer - Lung Father   . COPD Sister   .  Diabetes type II Sister   . Hypertension Sister   . Sleep apnea Sister   . Arthritis Sister   . Colitis Sister        Maybe?  . Irregular heart beat Brother   . Alcoholism Brother   . Arthritis Brother   . Colon cancer Neg Hx   . Inflammatory bowel disease Neg Hx     Social History   Socioeconomic History  . Marital status: Married    Spouse name: Not on file  . Number of children: Not on file  . Years of education: Not on file  . Highest education level: Not on file  Occupational History  . Not on file  Tobacco Use  . Smoking status: Former Smoker    Packs/day: 2.00    Years: 10.00    Pack years: 20.00    Types: Cigarettes    Quit date: 02/13/1987    Years since quitting: 33.5  . Smokeless tobacco: Never Used  Vaping Use  . Vaping Use: Never used  Substance and Sexual Activity  . Alcohol use: No  . Drug use: No  . Sexual activity: Yes    Birth control/protection: None  Other Topics Concern  . Not on file  Social History Narrative  . Not on file   Social Determinants of Health   Financial Resource Strain:  Not on file  Food Insecurity: Not on file  Transportation Needs: Not on file  Physical Activity: Not on file  Stress: Not on file  Social Connections: Not on file    Review of Systems: Gen: Denies fever, chills, cold or flulike symptoms, presyncope, syncope. CV: See HPI Resp: Denies shortness of breath or cough. GI: See HPI Derm: See HPI Heme: See HPI  Physical Exam: BP 136/79   Pulse 90   Temp (!) 97.1 F (36.2 C)   Ht 6' (1.829 m)   Wt (!) 319 lb (144.7 kg)   BMI 43.26 kg/m  General:   Alert and oriented. No distress noted. Pleasant and cooperative.  Head:  Normocephalic and atraumatic. Eyes:  Conjuctiva clear without scleral icterus. Heart:  S1, S2 present without murmurs appreciated. Lungs:  Clear to auscultation bilaterally. No wheezes, rales, or rhonchi. No distress.  Abdomen:  +BS. Obese abdomen, soft, minimal TTP in LLQ. No rebound or guarding. No HSM or masses noted. Rectal: External exam- 5-6 cm round lesion that is erythematous and indurated on right buttock. Mildly warm to the touch with mild tenderness. Consistent with skin abscess.   Msk:  Symmetrical without gross deformities. Normal posture. Extremities:  Without edema. Neurologic:  Alert and  oriented x4 Psych:  Normal mood and affect.   Assessment: 60 year old male presenting today for follow-up of ileocolonic Crohn's disease. Also reporting abscess on his buttock, dysphagia, and chest pain.    Ileocolonic Crohn's disease: Diagnosed 1993.  Previously, he was maintained on Lialda 4.8 g daily.  Recent colonoscopy February 2022 revealing IBD with skipped involvement of the colon endoscopically consistent with Crohn's colitis s/p segmental biopsy.  Normal-appearing distal TI, ulcerated ileocecal valve.  Pathology with mildly active chronic colitis in the ileocecal valve, moderately active chronic colitis in the descending colon, severely active chronic colitis with ulceration in the sigmoid colon.   As he  clinically downplayed his symptoms at the time of his colonoscopy, he was advised to follow-up in the office to discuss advancing Crohn's therapy.  Due to abdominal pain and increased diarrhea in early April, stool studies were completed and C.  difficile toxin A/B by PCR was positive.  Treated with vancomycin 4 times daily x4 days with clinical improvement from 20 bowel movements per day down to 7 bowel movements per day which was close to his baseline.  Started on Entocort 9 mg daily at that time.  Today he reports having on average 8-15 small Bristol 5-7 BMs per day, low-volume rectal bleeding, tenesmus, urgency, and mild intermittent RLQ and LLQ abdominal pain.  Mild nausea without vomiting.  No fever or chills. Notably, patient also tells me he stopped his Lialda when he started budesonide.  Suspect we are likely dealing with Crohn's flare as he stopped Lialda.  However, cannot rule out persistent/recurrent C. difficile infection as he previously noted improvement with antibiotics.  We will plan to update labs and recheck stool studies at this time.  If stool studies are negative, we will plan to advance to Humira for Crohn's management. This was discussed today. Pretreatment labs have already been completed.  Skin abscess of buttock:  5-6 cm round lesion that is erythematous and indurated on right buttock. Mildly warm to the touch with mild tenderness. Consistent with skin abscess.  Recommended supportive measures with hot compresses/warm water soaks with hopes that lesion will drain spontaneously.  No antibiotics prescribed today considering recent C. difficile infection and question of recurrence.  Advised to follow-up with PCP if symptoms do not improve within the next week or if symptoms worsen.  Dysphagia: Reports intermittent solid food dysphagia.  Rare GERD.  No prior EGD.  Differentials include esophageal web, ring, or stricture.  Less likely malignancy.  Needs EGD for further evaluation and  therapeutic intervention as appropriate.  However, due to reported central chest pain that is worsening, will refer to cardiology prior to EGD.  Chest pain: Centralized chest pain that seems most closely associated with anxiety/stress. Increasing in frequency. Unable to tell me if this is associated with activity. No SOB. Rare GERD. Etiology isn't clear. Can't rule out cardiac component. May have atypical GERD as he also reports dysphagia. Referring to cardiology for evaluation prior to EGD. Consider starting PPI empirically if C. difficile testing is negative.  Reinforced GERD diet/lifestyle.  Plan: 1.  CBC, BMP, CRP, sed rate, C. difficile GDH and toxin A/B, GI pathogen panel.  2.  Continue Entocort 9 mg daily.  3.  If stool studies are negative, we will submit for Humira.  4.  Apply warm compresses or soak in warm water 2-3 times per day to help with cutaneous buttock abscess.  Follow-up with PCP in 1 week if symptoms do not improve or if they become worse.   5.  Refer to cardiology for further evaluation of chest discomfort.  6.  Patient needs EGD with possible dilation in the near future.  Holding off on scheduling this until cardiac etiology of chest pain has been ruled out.  7.  Eat slowly, take small bites, chew thoroughly, drink plenty of liquids throughout meals.  8.  Advised to proceed to the emergency room if something were to get stuck in his esophagus and not come up or go down.  9.  Reinforced GERD diet/lifestyle.  Written instructions provided.  10.  Consider starting empiric PPI in the setting of occasional reflux, dysphagia, and chest pain if C. difficile testing is negative.    Aliene Altes, PA-C [facility] 08/17/2020

## 2020-08-17 ENCOUNTER — Other Ambulatory Visit: Payer: Self-pay

## 2020-08-17 ENCOUNTER — Encounter: Payer: Self-pay | Admitting: Gastroenterology

## 2020-08-17 ENCOUNTER — Ambulatory Visit: Payer: Commercial Managed Care - PPO | Admitting: Gastroenterology

## 2020-08-17 ENCOUNTER — Other Ambulatory Visit: Payer: Self-pay | Admitting: *Deleted

## 2020-08-17 VITALS — BP 136/79 | HR 90 | Temp 97.1°F | Ht 72.0 in | Wt 319.0 lb

## 2020-08-17 DIAGNOSIS — L0231 Cutaneous abscess of buttock: Secondary | ICD-10-CM | POA: Insufficient documentation

## 2020-08-17 DIAGNOSIS — R079 Chest pain, unspecified: Secondary | ICD-10-CM

## 2020-08-17 DIAGNOSIS — K219 Gastro-esophageal reflux disease without esophagitis: Secondary | ICD-10-CM | POA: Diagnosis not present

## 2020-08-17 DIAGNOSIS — R197 Diarrhea, unspecified: Secondary | ICD-10-CM

## 2020-08-17 DIAGNOSIS — R1310 Dysphagia, unspecified: Secondary | ICD-10-CM | POA: Diagnosis not present

## 2020-08-17 DIAGNOSIS — K50811 Crohn's disease of both small and large intestine with rectal bleeding: Secondary | ICD-10-CM | POA: Diagnosis not present

## 2020-08-17 NOTE — Patient Instructions (Addendum)
Please have labs and stool studies completed at [facility].   Continue budesonide 9 mg daily.  To help with the infection on your bottock, apply warm compresses or soak in warm water 2-3 times a day. Please follow-up with your primary care if symptoms do not improve over the next week or if this becomes significantly painful.  We will refer you to cardiology for further evaluation of chest discomfort.  Follow a GERD diet:  Avoid fried, fatty, greasy, spicy, citrus foods. Avoid caffeine and carbonated beverages. Avoid chocolate. Try eating 4-6 small meals a day rather than 3 large meals. Do not eat within 3 hours of laying down. Prop head of bed up on wood or bricks to create a 6 inch incline.  To help with trouble swallowing: Eat slowly, take small bites, chew thoroughly, drink plenty of liquids throughout meals. If something gets stuck in your esophagus and will not come up or go down, you should proceed to the emergency room.  We we will call you with further recommendations once I have your lab results.

## 2020-08-18 ENCOUNTER — Encounter: Payer: Self-pay | Admitting: Gastroenterology

## 2020-08-18 LAB — CBC WITH DIFFERENTIAL/PLATELET
Absolute Monocytes: 689 cells/uL (ref 200–950)
Basophils Absolute: 28 cells/uL (ref 0–200)
Basophils Relative: 0.4 %
Eosinophils Absolute: 57 cells/uL (ref 15–500)
Eosinophils Relative: 0.8 %
HCT: 40.3 % (ref 38.5–50.0)
Hemoglobin: 13.4 g/dL (ref 13.2–17.1)
Lymphs Abs: 1079 cells/uL (ref 850–3900)
MCH: 30.9 pg (ref 27.0–33.0)
MCHC: 33.3 g/dL (ref 32.0–36.0)
MCV: 93.1 fL (ref 80.0–100.0)
MPV: 8.9 fL (ref 7.5–12.5)
Monocytes Relative: 9.7 %
Neutro Abs: 5247 cells/uL (ref 1500–7800)
Neutrophils Relative %: 73.9 %
Platelets: 234 10*3/uL (ref 140–400)
RBC: 4.33 10*6/uL (ref 4.20–5.80)
RDW: 12.8 % (ref 11.0–15.0)
Total Lymphocyte: 15.2 %
WBC: 7.1 10*3/uL (ref 3.8–10.8)

## 2020-08-18 LAB — SEDIMENTATION RATE: Sed Rate: 9 mm/h (ref 0–20)

## 2020-08-18 LAB — BASIC METABOLIC PANEL
BUN: 9 mg/dL (ref 7–25)
CO2: 34 mmol/L — ABNORMAL HIGH (ref 20–32)
Calcium: 8.6 mg/dL (ref 8.6–10.3)
Chloride: 102 mmol/L (ref 98–110)
Creat: 0.79 mg/dL (ref 0.70–1.33)
Glucose, Bld: 121 mg/dL (ref 65–139)
Potassium: 4.3 mmol/L (ref 3.5–5.3)
Sodium: 141 mmol/L (ref 135–146)

## 2020-08-18 LAB — C-REACTIVE PROTEIN: CRP: 17.9 mg/L — ABNORMAL HIGH (ref <8.0)

## 2020-08-19 LAB — C. DIFFICILE GDH AND TOXIN A/B
GDH ANTIGEN: NOT DETECTED
MICRO NUMBER:: 11874479
SPECIMEN QUALITY:: ADEQUATE
TOXIN A AND B: NOT DETECTED

## 2020-08-19 LAB — GASTROINTESTINAL PATHOGEN PANEL PCR
C. difficile Tox A/B, PCR: NOT DETECTED
Campylobacter, PCR: NOT DETECTED
Cryptosporidium, PCR: NOT DETECTED
E coli (ETEC) LT/ST PCR: NOT DETECTED
E coli (STEC) stx1/stx2, PCR: NOT DETECTED
E coli 0157, PCR: NOT DETECTED
Giardia lamblia, PCR: NOT DETECTED
Norovirus, PCR: NOT DETECTED
Rotavirus A, PCR: NOT DETECTED
Salmonella, PCR: NOT DETECTED
Shigella, PCR: NOT DETECTED

## 2020-08-19 NOTE — Progress Notes (Signed)
Cc'ed to pcp °

## 2020-08-20 ENCOUNTER — Other Ambulatory Visit: Payer: Self-pay | Admitting: Gastroenterology

## 2020-08-20 DIAGNOSIS — K50811 Crohn's disease of both small and large intestine with rectal bleeding: Secondary | ICD-10-CM

## 2020-08-20 MED ORDER — PREDNISONE 10 MG PO TABS: ORAL_TABLET | ORAL | 0 refills | Status: DC

## 2020-09-10 ENCOUNTER — Other Ambulatory Visit: Payer: Self-pay | Admitting: Gastroenterology

## 2020-09-10 DIAGNOSIS — K50811 Crohn's disease of both small and large intestine with rectal bleeding: Secondary | ICD-10-CM

## 2020-09-17 ENCOUNTER — Other Ambulatory Visit: Payer: Self-pay | Admitting: Gastroenterology

## 2020-09-17 DIAGNOSIS — K50811 Crohn's disease of both small and large intestine with rectal bleeding: Secondary | ICD-10-CM

## 2020-09-17 MED ORDER — PREDNISONE 10 MG PO TABS: ORAL_TABLET | ORAL | 0 refills | Status: DC

## 2020-09-17 NOTE — Progress Notes (Signed)
Called [employer].  Representative told me that Humira was denied.  She said that determination was made on 09/12/2020 and a letter should be sent to pt and provider.  She said that she couldn't see the reason in her data base.  Will be looking for denial letter.

## 2020-09-18 ENCOUNTER — Telehealth: Payer: Self-pay | Admitting: *Deleted

## 2020-09-18 NOTE — Telephone Encounter (Signed)
Called [employer].  They informed me that I needed to call [employer] to get denial letter faxed to [place].  Spoke to [employer]  She is faxing it to [place].  She informed me that denial was due to PA on file.

## 2020-09-22 NOTE — Telephone Encounter (Signed)
Will try again tomorrow.  Tried to call again a few more days today but kept getting voice mail.

## 2020-09-22 NOTE — Telephone Encounter (Signed)
Noted. Please keep me updated. Can we try following up on this tomorrow if we do not hear back from patients wife?

## 2020-09-22 NOTE — Telephone Encounter (Signed)
Spoke to [employer] at [employer] this morning.  She informed me that there isn't a denial letter to send to [place].  She said the problem is the pt's insurance doesn't cover Humira.  She informed me that they have a patient assistance program available to pt.  He just needs to call them. Ref#: [id number]. Called pt and informed him. He said that he was very busy at work and could not take down the phone number.  He requested me call his wife.  Lmom for her to call me back.

## 2020-09-22 NOTE — Telephone Encounter (Signed)
Wife and husband called back.  They were given the number to [employer].  Explained to them that his insurance would not cover Humira but Brandice told me that he needs to contact them to ask about patient assistance program.  [phone]  Ref: PA# [id number].  Informed pt to call us and keep [place] informed.  He voiced understanding.

## 2020-09-24 ENCOUNTER — Telehealth: Payer: Self-pay

## 2020-09-24 NOTE — Telephone Encounter (Signed)
Ricky Blackwell, I have paperwork for the pt's Humira treatment. It just needs your directions (starter or ongoing treatment) and your signature.   thanks

## 2020-09-24 NOTE — Telephone Encounter (Signed)
This was previously taken care of by Angie. Thanks.

## 2020-09-24 NOTE — Telephone Encounter (Signed)
noted 

## 2020-09-28 NOTE — Telephone Encounter (Signed)
Spoke with patient this morning. States he and the patient assistance program have been playing "phone tag". He is going to call them back later this afternoon. He is at work currently and unable to talk much but states he is feeling "better" with resuming prednisone. Advised I would call back later this week to see if he was able to get in contact with the patient assistance program.

## 2020-09-30 ENCOUNTER — Telehealth: Payer: Self-pay | Admitting: Internal Medicine

## 2020-09-30 NOTE — Telephone Encounter (Signed)
Spoke to pt.  He was informed that he needs to contact nurse ambassador to come out and help him with his first injection.  Provided phone number for pt to call.  Pt voiced understanding.  Routing to [employer], [place] as Juluis Rainier.

## 2020-09-30 NOTE — Telephone Encounter (Signed)
PATIENT CALLED AND SAID HE RECEIVED HIS HUMIRA YESTERDAY.

## 2020-10-01 ENCOUNTER — Telehealth: Payer: Self-pay

## 2020-10-01 NOTE — Telephone Encounter (Signed)
Pt did his first injection yesterday (2 divided doses).  The nurse ambassador talked him through it over the phone.  His 2nd injection is scheduled for 10/14/2020.  [place], PA aware verbally.

## 2020-10-01 NOTE — Telephone Encounter (Signed)
Pt did his first injection yesterday (2 divided doses).  The nurse ambassador talked him through it over the phone.  His 2nd injection is scheduled for 10/14/2020.  Pt made aware to call us on his 3rd injection.  He was made aware that we will see how he is feeling then and taper prednisone at that time if he is feeling well.

## 2020-10-01 NOTE — Telephone Encounter (Signed)
Routing to [employer] to arrange ov in a few weeks.

## 2020-10-01 NOTE — Telephone Encounter (Signed)
Noted. This is great news! Has he been able to set up a time with the nurse ambassador?

## 2020-10-01 NOTE — Telephone Encounter (Signed)
Lets have patient call our office back after he gives himself the first 3 doses of Humira (160 mg on day 1, then 80 mg on day 15, then 40 mg on day 29).  If he is feeling well after his third dose, we will taper prednisone at that time.  Looks like patient may have canceled his follow-up in July.  We need to get him scheduled for follow-up in the next 4 to 6 weeks if possible.  Stacey: Can we work on getting patient scheduled in the next 4-6 weeks? Dx: Crohns.

## 2020-10-01 NOTE — Telephone Encounter (Signed)
Ricky Blackwell,  This pt called this morning (message left on vm) wondering does he need to continue taking Prednisone while he is taking Humira. He got his Humira yesterday. Please advise

## 2020-10-02 NOTE — Progress Notes (Signed)
CARDIOLOGY CONSULT NOTE       Patient ID: Ricky Blackwell MRN: [id number] DOB/AGE: 1960-07-15 60 y.o.  Admit date: (Not on file) Referring Physician: Aliene Altes PA Primary Physician: Adaline Sill, NP Primary Cardiologist: New Reason for Consultation: Chest Pain  Active Problems:   * No active hospital problems. *   HPI:  60 y.o. with advanced Crohn's disease Recently started on Humira. Seen by GI 08/17/20 and complained of central chest discomfort. Not exertional can be constant. Worse with stress and anxiety Does have some GERD and solid food dysphagia GI wanted cardiac evaluation prior to EGD. He has no history of cardiac issues. No vascular disease Previous smoker with DM and HTN.    Pain is atypical Has two types One for last 6 months sharp non exertional use to last seconds now minutes Nothing makes it better   2nd pain is more GI related to food difficulty swallowing and spasm like   He cut his left arm with box knife earlier in week Has had tetanus shot February   Chronic venous disease in legs not helped by obesity   ROS All other systems reviewed and negative except as noted above  Past Medical History:  Diagnosis Date   Anal stenosis    Arthritis    Colitis    COVID-19 04/2020   Crohn's disease ([place]) 1993   H/O hiatal hernia    History of kidney stones    Hypertension    Irritable bowel syndrome    Pneumonia    hx   Rectal bleeding    Unspecified constipation     Family History  Problem Relation Age of Onset   Diabetes Mother    Heart failure Mother    Heart failure Father    Colon polyps Father    Cancer - Lung Father    COPD Sister    Diabetes type II Sister    Hypertension Sister    Sleep apnea Sister    Arthritis Sister    Colitis Sister        Maybe?   Irregular heart beat Brother    Alcoholism Brother    Arthritis Brother    Colon cancer Neg Hx    Inflammatory bowel disease Neg Hx     Social History   Socioeconomic History    Marital status: Married    Spouse name: Not on file   Number of children: Not on file   Years of education: Not on file   Highest education level: Not on file  Occupational History   Not on file  Tobacco Use   Smoking status: Former    Packs/day: 2.00    Years: 10.00    Pack years: 20.00    Types: Cigarettes    Quit date: 02/13/1987    Years since quitting: 33.6   Smokeless tobacco: Never  Vaping Use   Vaping Use: Never used  Substance and Sexual Activity   Alcohol use: No   Drug use: No   Sexual activity: Yes    Birth control/protection: None  Other Topics Concern   Not on file  Social History Narrative   Not on file   Social Determinants of Health   Financial Resource Strain: Not on file  Food Insecurity: Not on file  Transportation Needs: Not on file  Physical Activity: Not on file  Stress: Not on file  Social Connections: Not on file  Intimate Partner Violence: Not on file    Past Surgical History:  Procedure Laterality Date   ABDOMINAL HERNIA REPAIR  2010   BACK SURGERY  2000   BIOPSY  05/21/2020   Procedure: BIOPSY;  Surgeon: Daneil Dolin, MD;  [facility];  Service: Endoscopy;;   COLONOSCOPY  05/2004   [facility]- crohns    COLONOSCOPY  08/20/2008   Dr. Gala Romney- normal distal rectum, proximal rectum, scattered 1-2 mm aphthous ulcers, these extended into the distal sigmoid.   COLONOSCOPY N/A 10/03/2014   [name]: findings c/w mildly active ileocolonic crohn's with scattered aphthous ulcers in the distal sigmoid colon, descending colon, ascending colon, terminal ileum. Transverse colon was normal.   COLONOSCOPY WITH PROPOFOL N/A 05/25/2017   Procedure: COLONOSCOPY WITH PROPOFOL;  Surgeon: Daneil Dolin, MD;  scattered ulcers and erythema involving the rectum colon and terminal ileum consistent with Crohn's disease, one benign 4 mm sessile polyp, s/p frequent segmental biopsies.  Pathology with chronic mildly active ileitis and mildly active  colitis in ascending colon, sigmoid colon, and rectum.  Repeat in 2 years.   COLONOSCOPY WITH PROPOFOL N/A 05/21/2020    Surgeon: Daneil Dolin, MD;  IBD with skipped involvement of the colon endoscopically consistent with Crohn's colitis s/p segmental biopsy.  Normal-appearing distal TI, ulcerated ileocecal valve.  Pathology with mildly active chronic colitis in the ileocecal valve, moderately active chronic colitis in the descending colon, severely active chronic colitis with ulceration in the sigmoid colon.    KNEE SURGERY Right 2010   Left Arthoscopic   NECK SURGERY     NECK SURGERY N/A 2005   metal plate in neck   POLYPECTOMY  05/25/2017   Procedure: POLYPECTOMY;  Surgeon: Daneil Dolin, MD;  [facility];  Service: Endoscopy;;  Sigmoid colon (CS)   TOTAL KNEE ARTHROPLASTY Left 02/22/2013   Procedure: LEFT TOTAL KNEE ARTHROPLASTY;  Surgeon: Yvette Rack., MD;  Location: [facility];  Service: Orthopedics;  Laterality: Left;      Current Outpatient Medications:    cyclobenzaprine (FLEXERIL) 10 MG tablet, Take 10 mg by mouth 3 (three) times daily as needed for muscle spasms., Disp: , Rfl:    hydrochlorothiazide (MICROZIDE) 12.5 MG capsule, Take 12.5 mg by mouth daily., Disp: , Rfl:    lisinopril (PRINIVIL,ZESTRIL) 10 MG tablet, Take 10 mg by mouth daily. , Disp: , Rfl: 0   metFORMIN (GLUCOPHAGE) 500 MG tablet, Take 500 mg by mouth every evening., Disp: , Rfl:    oxyCODONE-acetaminophen (PERCOCET) 5-325 MG tablet, Take 1 tablet by mouth every 4 (four) hours as needed for severe pain., Disp: 10 tablet, Rfl: 0   predniSONE (DELTASONE) 10 MG tablet, Take 3 tablets (30 mg total) by mouth daily., Disp: 90 tablet, Rfl: 0   tamsulosin (FLOMAX) 0.4 MG CAPS capsule, Take 0.4 mg by mouth daily after supper., Disp: , Rfl: 10    Physical Exam: There were no vitals taken for this visit.    Affect appropriate Obese male  HEENT: normal Neck supple with no adenopathy JVP normal no bruits  no thyromegaly Lungs clear with no wheezing and good diaphragmatic motion Heart:  S1/S2 no murmur, no rub, gallop or click PMI normal Abdomen: benighn, BS positve, no tenderness, no AAA no bruit.  No HSM or HJR Distal pulses intact with no bruits Plus one edema with chronic venous stasis changes  Neuro non-focal Post left TKR  Laceration LUE with wrap on it    Labs:   Lab Results  Component Value Date   WBC  7.1 08/17/2020   HGB 13.4 08/17/2020   HCT 40.3 08/17/2020   MCV 93.1 08/17/2020   PLT 234 08/17/2020   No results for input(s): NA, K, CL, CO2, BUN, CREATININE, CALCIUM, PROT, BILITOT, ALKPHOS, ALT, AST, GLUCOSE in the last 168 hours.  Invalid input(s): LABALBU No results found for: CKTOTAL, CKMB, CKMBINDEX, TROPONINI No results found for: CHOL No results found for: HDL No results found for: LDLCALC No results found for: TRIG No results found for: CHOLHDL No results found for: LDLDIRECT    Radiology: No results found.  EKG: 05/19/20 SR LAD poor R wave progression LVH   ASSESSMENT AND PLAN:   Chest Pain: atypical may be related to GERD/dysphagia. Given CRF;s DM, HTN previous smoking and inflammatory disease would favor risk stratification with exercise myovue SL nitro called in Told him to try pepcid complete first and then nitro  HTN:  Well controlled.  Continue current medications and low sodium Dash type diet.   DM:  Discussed low carb diet.  Target hemoglobin A1c is 6.5 or less.  Continue current medications. Colitis :  since 1993 major factor in poor quality of life. Started on Humira will need EGD for dysphagia after cardiac testing    SL nitro Ex Myovue  F/U PRN if normal   Signed: Jenkins Rouge 10/02/2020, 5:52 PM

## 2020-10-05 NOTE — Telephone Encounter (Signed)
Pt assistant counselor has received application for assistance and will contact us with results. Photographer.

## 2020-10-14 ENCOUNTER — Ambulatory Visit: Payer: Commercial Managed Care - PPO | Admitting: Cardiovascular Disease

## 2020-10-14 ENCOUNTER — Encounter: Payer: Self-pay | Admitting: Cardiovascular Disease

## 2020-10-14 ENCOUNTER — Other Ambulatory Visit: Payer: Self-pay

## 2020-10-14 ENCOUNTER — Encounter: Payer: Self-pay | Admitting: *Deleted

## 2020-10-14 VITALS — BP 144/78 | HR 80 | Ht 71.5 in | Wt 314.0 lb

## 2020-10-14 DIAGNOSIS — R079 Chest pain, unspecified: Secondary | ICD-10-CM

## 2020-10-14 DIAGNOSIS — K529 Noninfective gastroenteritis and colitis, unspecified: Secondary | ICD-10-CM

## 2020-10-14 DIAGNOSIS — I10 Essential (primary) hypertension: Secondary | ICD-10-CM | POA: Diagnosis not present

## 2020-10-14 MED ORDER — NITROGLYCERIN 0.4 MG SL SUBL: 0.4000 mg | SUBLINGUAL_TABLET | SUBLINGUAL | 3 refills | Status: AC | PRN

## 2020-10-14 NOTE — Telephone Encounter (Signed)
Routing to Angie who has been working on this. Please let me know if there is anything I need to do.

## 2020-10-14 NOTE — Patient Instructions (Signed)
Medication Instructions:  Your physician recommends that you continue on your current medications as directed. Please refer to the Current Medication list given to you today.  *If you need a refill on your cardiac medications before your next appointment, please call your pharmacy*   Lab Work: NONE   If you have labs (blood work) drawn today and your tests are completely normal, you will receive your results only by: [place] (if you have MyChart) OR A paper copy in the mail If you have any lab test that is abnormal or we need to change your treatment, we will call you to review the results.   Testing/Procedures: Your physician has requested that you have en exercise stress myoview. For further information please visit [email or web address]. Please follow instruction sheet, as given.    Follow-Up: At [facility], you and your health needs are our priority.  As part of our continuing mission to provide you with exceptional heart care, we have created designated Provider Care Teams.  These Care Teams include your primary Cardiologist (physician) and Advanced Practice Providers (APPs -  Physician Assistants and Nurse Practitioners) who all work together to provide you with the care you need, when you need it.  We recommend signing up for the patient portal called "MyChart".  Sign up information is provided on this After Visit Summary.  MyChart is used to connect with patients for Virtual Visits (Telemedicine).  Patients are able to view lab/test results, encounter notes, upcoming appointments, etc.  Non-urgent messages can be sent to your provider as well.   To learn more about what you can do with MyChart, go to [email or web address].    Your next appointment:    As Needed   The format for your next appointment:   In Person  Provider:   Jenkins Rouge, MD   Other Instructions Thank you for choosing [facility]!

## 2020-10-14 NOTE — Telephone Encounter (Signed)
noted 

## 2020-10-14 NOTE — Telephone Encounter (Signed)
FYI: We have correspondence from [facility]. Once they complete their review they will contact us with results. The pt assistance counselor contact information is [phone]. I'm checking with Angie because I thought this pt already had his medication going.

## 2020-10-15 ENCOUNTER — Other Ambulatory Visit: Payer: Self-pay | Admitting: Gastroenterology

## 2020-10-15 DIAGNOSIS — K50811 Crohn's disease of both small and large intestine with rectal bleeding: Secondary | ICD-10-CM

## 2020-10-16 ENCOUNTER — Telehealth: Payer: Self-pay | Admitting: Internal Medicine

## 2020-10-16 NOTE — Telephone Encounter (Signed)
Returned the pt's call and his vm was full.

## 2020-10-16 NOTE — Telephone Encounter (Signed)
[facility] called and said that [facility] for his prednisone, he stated he took the last 3 today and needs to know if Cyril Mourning wants him to continue taking them   patient number [phone]

## 2020-10-19 ENCOUNTER — Ambulatory Visit (HOSPITAL_COMMUNITY)
Admission: RE | Admit: 2020-10-19 | Discharge: 2020-10-19 | Disposition: A | Discharge status: Home / Self Care | Payer: Commercial Managed Care - PPO | Source: Ambulatory Visit | Attending: Cardiovascular Disease | Admitting: Cardiovascular Disease

## 2020-10-19 ENCOUNTER — Other Ambulatory Visit: Payer: Self-pay

## 2020-10-19 DIAGNOSIS — R079 Chest pain, unspecified: Secondary | ICD-10-CM | POA: Diagnosis not present

## 2020-10-19 LAB — NM MYOCAR MULTI W/SPECT W/WALL MOTION / EF
Estimated workload: 7.2 METS
Exercise duration (min): 6 min
Exercise duration (sec): 26 s
LV dias vol: 110 mL (ref 62–150)
LV sys vol: 46 mL
MPHR: 161 {beats}/min
Peak HR: 144 {beats}/min
Percent HR: 89 %
RATE: 0.37
RPE: 15
Rest HR: 68 {beats}/min
SDS: 0
SRS: 2
SSS: 2
TID: 1.17

## 2020-10-19 MED ORDER — TECHNETIUM TC 99M TETROFOSMIN IV KIT
30.0000 | PACK | Freq: Once | INTRAVENOUS | Status: AC | PRN
  Administered 2020-10-19: 30 via INTRAVENOUS

## 2020-10-19 MED ORDER — TECHNETIUM TC 99M TETROFOSMIN IV KIT
10.0000 | PACK | Freq: Once | INTRAVENOUS | Status: AC | PRN
  Administered 2020-10-19: 9.4 via INTRAVENOUS

## 2020-10-19 MED ORDER — SODIUM CHLORIDE FLUSH 0.9 % IV SOLN
INTRAVENOUS | Status: AC
  Administered 2020-10-19: 10 mL via INTRAVENOUS
  Filled 2020-10-19: qty 10

## 2020-10-19 MED ORDER — REGADENOSON 0.4 MG/5ML IV SOLN
INTRAVENOUS | Status: AC
  Filled 2020-10-19: qty 5

## 2020-10-19 NOTE — Telephone Encounter (Signed)
Spoke to [place] at [facility].  They received our request for manual signature on form on 10/14/2020.  No further action needed.

## 2020-10-19 NOTE — Telephone Encounter (Signed)
Pt said that he has been out of Prednisone since Friday.  He is agreeable to tapering and was made aware of how to taper it. He is aware that you will send RX to [facility] [place].  Pt voiced understanding.

## 2020-10-19 NOTE — Telephone Encounter (Signed)
Noted  

## 2020-10-19 NOTE — Telephone Encounter (Signed)
He had been on prednisone 30 mg daily correct? I agree we need to get him off prednisone, but we will need to taper this. He should take 20 mg daily x1 week then 10 mg daily x 1 week, then 5 mg daily x 1 week, then stop.  I can send in a refill if needed. Please let me know how much prednisone he has left.

## 2020-10-19 NOTE — Telephone Encounter (Signed)
Spoke to pt.  He took 2nd injection of Humira on last Wednesday.  Took last Prednisone on Friday.  Wants to know if he still needs to continue Prednisone.  Pt said he feels ok without Prednisone.

## 2020-10-20 ENCOUNTER — Other Ambulatory Visit: Payer: Self-pay | Admitting: Gastroenterology

## 2020-10-20 ENCOUNTER — Telehealth: Payer: Self-pay | Admitting: Cardiovascular Disease

## 2020-10-20 NOTE — Telephone Encounter (Signed)
Attempted to contact patient, unable to reach patient and unable to leave a voicemail at this time due to VM being full. Will try again at another time.

## 2020-10-20 NOTE — Telephone Encounter (Signed)
Attempted to return call to patient on number provided. Unable to reach patient at this time, and unable to leave a message due to VM being full. Will route call back to triage for follow up.

## 2020-10-20 NOTE — Telephone Encounter (Signed)
Ricky Blackwell is returning Jenna's call. Please advise.

## 2020-10-20 NOTE — Telephone Encounter (Signed)
Pt aware that RX has been sent to pharmacy.

## 2020-10-20 NOTE — Telephone Encounter (Signed)
New message     Pt c/o of Chest Pain: STAT if CP now or developed within 24 hours  1. Are you having CP right now no   2. Are you experiencing any other symptoms (ex. SOB, nausea, vomiting, sweating)? He got SOB and his bp was high 165/90 when the EMT checked it then it came down to 115/68 after taking nitro  3. How long have you been experiencing CP? He has severe chest pains at work today and they had to call the EMT    4. Is your CP continuous or coming and going? Comes and goes   5. Have you taken Nitroglycerin? Yes he took 2 and it helped after 20 minute

## 2020-10-20 NOTE — Telephone Encounter (Signed)
Noted. Looks like Neil Crouch, PA sent in Rx today for prednisone taper.

## 2020-10-20 NOTE — Progress Notes (Signed)
Error. Was going to send in Rx for prednisone taper. Appears Neil Crouch, PA has already sent Rx today.

## 2020-10-21 NOTE — Telephone Encounter (Signed)
Spoke to pt who stated that he was still a little sore from yesterday, but that he was okay. Pt denies CP or SOB at the moment. Stated that it comes and goes. Pt is aware that if he should start experiencing symptoms, he should take nitro and go to ED or call 911 for emergent eval. Pt verbalized understanding.   Pt also notified of Dr. Kyla Balzarine note per his Myoview: Myovue is abnormal set up for cath would see in office to arrange Needs to bedone before any EGD  Pt is scheduled for 10/29/20 with Dr. Johnsie Cancel in the [place] office. Pt stated that he had EGD completed on 10/20/19.

## 2020-10-23 ENCOUNTER — Ambulatory Visit: Payer: Commercial Managed Care - PPO | Admitting: Student

## 2020-10-23 ENCOUNTER — Other Ambulatory Visit: Payer: Self-pay

## 2020-10-23 ENCOUNTER — Encounter: Payer: Self-pay | Admitting: Student

## 2020-10-23 ENCOUNTER — Other Ambulatory Visit (HOSPITAL_COMMUNITY)
Admission: RE | Admit: 2020-10-23 | Discharge: 2020-10-23 | Disposition: A | Discharge status: Home / Self Care | Payer: Commercial Managed Care - PPO | Source: Ambulatory Visit | Attending: Student | Admitting: Student

## 2020-10-23 VITALS — BP 128/80 | HR 76 | Ht 71.5 in | Wt 314.0 lb

## 2020-10-23 DIAGNOSIS — R9439 Abnormal result of other cardiovascular function study: Secondary | ICD-10-CM

## 2020-10-23 DIAGNOSIS — R079 Chest pain, unspecified: Secondary | ICD-10-CM | POA: Insufficient documentation

## 2020-10-23 DIAGNOSIS — I10 Essential (primary) hypertension: Secondary | ICD-10-CM | POA: Diagnosis not present

## 2020-10-23 DIAGNOSIS — R1310 Dysphagia, unspecified: Secondary | ICD-10-CM | POA: Diagnosis not present

## 2020-10-23 LAB — CBC
HCT: 43.3 % (ref 39.0–52.0)
Hemoglobin: 14.5 g/dL (ref 13.0–17.0)
MCH: 30.9 pg (ref 26.0–34.0)
MCHC: 33.5 g/dL (ref 30.0–36.0)
MCV: 92.1 fL (ref 80.0–100.0)
Platelets: 207 10*3/uL (ref 150–400)
RBC: 4.7 MIL/uL (ref 4.22–5.81)
RDW: 13.1 % (ref 11.5–15.5)
WBC: 7.7 10*3/uL (ref 4.0–10.5)
nRBC: 0 % (ref 0.0–0.2)

## 2020-10-23 LAB — BASIC METABOLIC PANEL
Anion gap: 8 (ref 5–15)
BUN: 17 mg/dL (ref 6–20)
CO2: 27 mmol/L (ref 22–32)
Calcium: 9 mg/dL (ref 8.9–10.3)
Chloride: 99 mmol/L (ref 98–111)
Creatinine, Ser: 0.9 mg/dL (ref 0.61–1.24)
GFR, Estimated: >60 mL/min (ref >60)
Glucose, Bld: 244 mg/dL — ABNORMAL HIGH (ref 70–99)
Potassium: 4.2 mmol/L (ref 3.5–5.1)
Sodium: 134 mmol/L — ABNORMAL LOW (ref 135–145)

## 2020-10-23 NOTE — Progress Notes (Signed)
Cardiology Office Note    Date:  10/24/2020   ID:  Ricky Blackwell, DOB [birth year], MRN [id number]  PCP:  Adaline Sill, NP  Cardiologist: Jenkins Rouge, MD    Chief Complaint  Patient presents with   Follow-up    Abnormal Stress Test    History of Present Illness:    Ricky Blackwell is a 60 y.o. male with past medical history of Crohn's disease, Type 2 DM and HTN who presents to the office today for follow-up of his abnormal stress test.  He was examined by Dr. Johnsie Cancel on 10/14/2020 for evaluation of chest discomfort which was not necessarily associated with exertion but could be worse with the stress or anxiety. He did have known acid reflux and some solid food dysphagia and GI had recommended cardiac evaluation prior to his EGD. His pain was overall felt to be atypical but given his cardiac risk factors, an Exercise Myoview was recommended for further evaluation. 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 Johnsie Cancel recommended a cardiac catheterization for definitive evaluation.  In talking with the patient today, he reports still having intermittent episodes of chest pain. He had an episode on Tuesday while at work and EMS was called out but he did not require transport. Took TUMS and NTG with minimal improvement initially then symptoms resolved. Was not triggered by food consumption but prior episodes of chest pain have been at times. He does report dyspnea on exertion. No specific orthopnea, PND or pitting edema.    Past Medical History:  Diagnosis Date   Anal stenosis    Arthritis    Colitis    COVID-19 04/2020   Crohn's disease ([place]) 1993   H/O hiatal hernia    History of kidney stones    Hypertension    Irritable bowel syndrome    Pneumonia    hx   Rectal bleeding    Unspecified constipation     Past Surgical History:  Procedure Laterality Date    ABDOMINAL HERNIA REPAIR  2010   BACK SURGERY  2000   BIOPSY  05/21/2020   Procedure: BIOPSY;  Surgeon: Ricky Dolin, MD;  [facility];  Service: Endoscopy;;   COLONOSCOPY  05/2004   Dr. [facility]- crohns    COLONOSCOPY  08/20/2008   Dr. Gala Romney- normal distal rectum, proximal rectum, scattered 1-2 mm aphthous ulcers, these extended into the distal sigmoid.   COLONOSCOPY N/A 10/03/2014   [name]: findings c/w mildly active ileocolonic crohn's with scattered aphthous ulcers in the distal sigmoid colon, descending colon, ascending colon, terminal ileum. Transverse colon was normal.   COLONOSCOPY WITH PROPOFOL N/A 05/25/2017   Procedure: COLONOSCOPY WITH PROPOFOL;  Surgeon: Ricky Dolin, MD;  scattered ulcers and erythema involving the rectum colon and terminal ileum consistent with Crohn's disease, one benign 4 mm sessile polyp, s/p frequent segmental biopsies.  Pathology with chronic mildly active ileitis and mildly active colitis in ascending colon, sigmoid colon, and rectum.  Repeat in 2 years.   COLONOSCOPY WITH PROPOFOL N/A 05/21/2020    Surgeon: Ricky Dolin, MD;  IBD with skipped involvement of the colon endoscopically consistent with Crohn's colitis s/p segmental biopsy.  Normal-appearing distal TI, ulcerated ileocecal valve.  Pathology with mildly active chronic colitis in the ileocecal valve, moderately active chronic colitis in the descending colon, severely active chronic colitis with ulceration in the sigmoid  colon.    KNEE SURGERY Right 2010   Left Arthoscopic   NECK SURGERY     NECK SURGERY N/A 2005   metal plate in neck   POLYPECTOMY  05/25/2017   Procedure: POLYPECTOMY;  Surgeon: Ricky Dolin, MD;  [facility];  Service: Endoscopy;;  Sigmoid colon (CS)   TOTAL KNEE ARTHROPLASTY Left 02/22/2013   Procedure: LEFT TOTAL KNEE ARTHROPLASTY;  Surgeon: Ricky Blackwell., MD;  Location: [facility];  Service: Orthopedics;  Laterality: Left;    Current  Medications: Outpatient Medications Prior to Visit  Medication Sig Dispense Refill   cyclobenzaprine (FLEXERIL) 10 MG tablet Take 10 mg by mouth 3 (three) times daily as needed for muscle spasms.     hydrochlorothiazide (MICROZIDE) 12.5 MG capsule Take 12.5 mg by mouth daily.     lisinopril (PRINIVIL,ZESTRIL) 10 MG tablet Take 10 mg by mouth daily.   0   metFORMIN (GLUCOPHAGE) 500 MG tablet Take 500 mg by mouth every evening.     nitroGLYCERIN (NITROSTAT) 0.4 MG SL tablet Place 1 tablet (0.4 mg total) under the tongue every 5 (five) minutes as needed for chest pain. 25 tablet 3   oxyCODONE-acetaminophen (PERCOCET) 5-325 MG tablet Take 1 tablet by mouth every 4 (four) hours as needed for severe pain. 10 tablet 0   predniSONE (DELTASONE) 10 MG tablet Take 2 tablets by mouth daily for 7 days, then 1 tablet by mouth daily for 7 days, then 1/2 tablet by mouth daily for 7 days then stop. 30 tablet 0   tamsulosin (FLOMAX) 0.4 MG CAPS capsule Take 0.4 mg by mouth daily after supper.  10   No facility-administered medications prior to visit.     Allergies:   Toradol [ketorolac tromethamine]   Social History   Socioeconomic History   Marital status: Married    Spouse name: Not on file   Number of children: Not on file   Years of education: Not on file   Highest education level: Not on file  Occupational History   Not on file  Tobacco Use   Smoking status: Former    Packs/day: 2.00    Years: 10.00    Pack years: 20.00    Types: Cigarettes    Quit date: 02/13/1987    Years since quitting: 33.7   Smokeless tobacco: Never  Vaping Use   Vaping Use: Never used  Substance and Sexual Activity   Alcohol use: No   Drug use: No   Sexual activity: Yes    Birth control/protection: None  Other Topics Concern   Not on file  Social History Narrative   Not on file   Social Determinants of Health   Financial Resource Strain: Not on file  Food Insecurity: Not on file  Transportation Needs: Not  on file  Physical Activity: Not on file  Stress: Not on file  Social Connections: Not on file     Family History:  The patient's family history includes Alcoholism in his brother; Arthritis in his brother and sister; COPD in his sister; Cancer - Lung in his father; Colitis in his sister; Colon polyps in his father; Diabetes in his mother; Diabetes type II in his sister; Heart failure in his father and mother; Hypertension in his sister; Irregular heart beat in his brother; Sleep apnea in his sister.   Review of Systems:    Please see the history of present illness.     All other systems reviewed and are otherwise negative except  as noted above.   Physical Exam:    VS:  BP 128/80   Pulse 76   Ht 5' 11.5" (1.816 m)   Wt (!) 314 lb (142.4 kg)   SpO2 97%   BMI 43.18 kg/m    General: Pleasant obese male appearing in no acute distress. Head: Normocephalic, atraumatic. Neck: No carotid bruits. JVD not elevated.  Lungs: Respirations regular and unlabored, without wheezes or rales.  Heart: Regular rate and rhythm. No S3 or S4.  No murmur, no rubs, or gallops appreciated. Abdomen: Appears non-distended. No obvious abdominal masses. Msk:  Strength and tone appear normal for age. No obvious joint deformities or effusions. Extremities: No clubbing or cyanosis. No pitting edema.  Distal pedal pulses are 2+ bilaterally. Neuro: Alert and oriented X 3. Moves all extremities spontaneously. No focal deficits noted. Psych:  Responds to questions appropriately with a normal affect. Skin: No rashes or lesions noted  Wt Readings from Last 3 Encounters:  10/23/20 (!) 314 lb (142.4 kg)  10/14/20 (!) 314 lb (142.4 kg)  08/17/20 (!) 319 lb (144.7 kg)     Studies/Labs Reviewed:   EKG:  EKG is not ordered today. EKG from 10/19/2020 (from NST) is reviewed and shows NSR, HR 70 with TWI along Leads III, V3 and V4.   Recent Labs: 06/30/2020: ALT 18 10/23/2020: BUN 17; Creatinine, Ser 0.90; Hemoglobin  14.5; Platelets 207; Potassium 4.2; Sodium 134   Lipid Panel No results found for: CHOL, TRIG, HDL, CHOLHDL, VLDL, LDLCALC, LDLDIRECT  Additional studies/ records that were reviewed today include:   NST: 10/2020 Blood pressure demonstrated a normal response to exercise. There was no ST segment deviation noted during stress. The left ventricular ejection fraction is normal (55-65%). 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 This is a low risk study. Duke treadmill score of 6.5 supports low risk for major cardiac events.  Assessment:    1. Chest pain, unspecified type   2. Abnormal stress test   3. Essential hypertension   4. Dysphagia, unspecified type      Plan:   In order of problems listed above:  1. Chest Pain/Abnormal Stress Test - He continues to have intermittent episodes of chest pain and some are associated with food consumption while others are not. Recent NST showed findings consistent with prior inferior infarct with mild peri-infarct ischemia and Dr. Johnsie Cancel recommended a cardiac catheterization for definitive evaluation. - The patient understands that risks include but are not limited to stroke (1 in 1000), death (1 in 49), kidney failure [usually temporary] (1 in 500), bleeding (1 in 200), allergic reaction [possibly serious] (1 in 200). Will schedule for next week. Given the physical nature of his job, a work note was provided to remain out his catheterization. Will start ASA 39m daily. Will request labs from PCP as he is not currently on statin therapy. We reviewed he should proceed to the ED in the interim if he has recurrent pain that does not resolve with SL NTG x3.   2. HTN - BP is well-controlled at 128/80 during today's visit. Remains on HCTZ 12.577mdaily and Lisinopril 1053maily.   3. Dysphagia - EGD is currently on hold given his abnormal stress test and pending catheterization.    Medication  Adjustments/Labs and Tests Ordered: Current medicines are reviewed at length with the patient today.  Concerns regarding medicines are outlined above.  Medication changes, Labs and Tests ordered today are listed in the Patient Instructions below.  Patient Instructions  Medication Instructions:  Your physician recommends that you continue on your current medications as directed. Please refer to the Current Medication list given to you today.  Start Aspirin 81 mg Daily   *If you need a refill on your cardiac medications before your next appointment, please call your pharmacy*   Lab Work: NONE  If you have labs (blood work) drawn today and your tests are completely normal, you will receive your results only by: [place] (if you have MyChart) OR A paper copy in the mail If you have any lab test that is abnormal or we need to change your treatment, we will call you to review the results.   Testing/Procedures: Your physician has requested that you have a cardiac catheterization. Cardiac catheterization is used to diagnose and/or treat various heart conditions. Doctors may recommend this procedure for a number of different reasons. The most common reason is to evaluate chest pain. Chest pain can be a symptom of coronary artery disease (CAD), and cardiac catheterization can show whether plaque is narrowing or blocking your heart's arteries. This procedure is also used to evaluate the valves, as well as measure the blood flow and oxygen levels in different parts of your heart. For further information please visit HugeFiesta.[email or web address]. Please follow instruction sheet, as given.    Follow-Up: At [facility], you and your health needs are our priority.  As part of our continuing mission to provide you with exceptional heart care, we have created designated Provider Care Teams.  These Care Teams include your primary Cardiologist (physician) and Advanced Practice Providers (APPs -  Physician  Assistants and Nurse Practitioners) who all work together to provide you with the care you need, when you need it.  We recommend signing up for the patient portal called "MyChart".  Sign up information is provided on this After Visit Summary.  MyChart is used to connect with patients for Virtual Visits (Telemedicine).  Patients are able to view lab/test results, encounter notes, upcoming appointments, etc.  Non-urgent messages can be sent to your provider as well.   To learn more about what you can do with MyChart, go to [email or web address].    Your next appointment:   3 week(s)  The format for your next appointment:   In Person  Provider:   Jenkins Rouge, MD or Bernerd Pho, PA-C   Other Instructions Thank you for choosing [facility]!     [place] [address] Dept: [phone] Loc: [phone]  ELISHUA RADFORD  10/23/2020  You are scheduled for a Cardiac Catheterization on Tuesday, July 19 with Dr. Glenetta Hew.  1. Please arrive at the [facility] (Main Entrance A) at [facility]: [address] [place], [place] [address] at 10:00 AM (This time is two hours before your procedure to ensure your preparation). Free valet parking service is available.   Special note: Every effort is made to have your procedure done on time. Please understand that emergencies sometimes delay scheduled procedures.  2. Diet: Do not eat solid foods after midnight.  The patient may have clear liquids until 5am upon the day of the procedure.  3. Labs: You will need to have blood drawn on Friday, July 15 at [place] [address], [place]  Open: 7am - 6pm, Sat 8am - 12 noon   Phone: [phone]. You do not need to be fasting.  4. Medication instructions  in preparation for your procedure:   Contrast Allergy: No  Stop taking, Lisinopril (Zestril or Prinivil)  Tuesday, July 19,  Do not take Hydrochlorothiazide the morning of your procedure   Do not take Diabetes Med Glucophage (Metformin) on the day of the procedure and HOLD 48 HOURS AFTER THE PROCEDURE.  On the morning of your procedure, take your Aspirin and any morning medicines NOT listed above.  You may use sips of water.  5. Plan for one night stay--bring personal belongings. 6. Bring a current list of your medications and current insurance cards. 7. You MUST have a responsible person to drive you home. 8. Someone MUST be with you the first 24 hours after you arrive home or your discharge will be delayed. 9. Please wear clothes that are easy to get on and off and wear slip-on shoes.  Thank you for allowing [place] to care for you!   -- [facility]   Signed, Erma Heritage, PA-C  10/24/2020 1:28 PM    [place] [address] [place],  [address] Phone: [phone] Fax: [phone]

## 2020-10-23 NOTE — Progress Notes (Signed)
CBC/BMET orders placed- Abnormal Myoview/Heart Cath

## 2020-10-23 NOTE — Patient Instructions (Signed)
Medication Instructions:  Your physician recommends that you continue on your current medications as directed. Please refer to the Current Medication list given to you today.  Start Aspirin 81 mg Daily   *If you need a refill on your cardiac medications before your next appointment, please call your pharmacy*   Lab Work: NONE  If you have labs (blood work) drawn today and your tests are completely normal, you will receive your results only by: [place] (if you have MyChart) OR A paper copy in the mail If you have any lab test that is abnormal or we need to change your treatment, we will call you to review the results.   Testing/Procedures: Your physician has requested that you have a cardiac catheterization. Cardiac catheterization is used to diagnose and/or treat various heart conditions. Doctors may recommend this procedure for a number of different reasons. The most common reason is to evaluate chest pain. Chest pain can be a symptom of coronary artery disease (CAD), and cardiac catheterization can show whether plaque is narrowing or blocking your heart's arteries. This procedure is also used to evaluate the valves, as well as measure the blood flow and oxygen levels in different parts of your heart. For further information please visit HugeFiesta.tn. Please follow instruction sheet, as given.    Follow-Up: At [facility], you and your health needs are our priority.  As part of our continuing mission to provide you with exceptional heart care, we have created designated Provider Care Teams.  These Care Teams include your primary Cardiologist (physician) and Advanced Practice Providers (APPs -  Physician Assistants and Nurse Practitioners) who all work together to provide you with the care you need, when you need it.  We recommend signing up for the patient portal called "MyChart".  Sign up information is provided on this After Visit Summary.  MyChart is used to connect with  patients for Virtual Visits (Telemedicine).  Patients are able to view lab/test results, encounter notes, upcoming appointments, etc.  Non-urgent messages can be sent to your provider as well.   To learn more about what you can do with MyChart, go to [email or web address].    Your next appointment:   3 week(s)  The format for your next appointment:   In Person  Provider:   Jenkins Rouge, MD or Ricky Pho, PA-C   Other Instructions Thank you for choosing [facility]!     [place] [address] Dept: [phone] Loc: [phone]  Ricky Blackwell  10/23/2020  You are scheduled for a Cardiac Catheterization on Tuesday, July 19 with Dr. Glenetta Blackwell.  1. Please arrive at the [facility] (Main Entrance A) at [facility]: [address] [place], [place] [address] at 10:00 AM (This time is two hours before your procedure to ensure your preparation). Free valet parking service is available.   Special note: Every effort is made to have your procedure done on time. Please understand that emergencies sometimes delay scheduled procedures.  2. Diet: Do not eat solid foods after midnight.  The patient may have clear liquids until 5am upon the day of the procedure.  3. Labs: You will need to have blood drawn on Friday, July 15 at [place] [address], [place]  Open: 7am - 6pm, Sat 8am - 12 noon   Phone: [phone]. You do not need to be fasting.  4. Medication instructions in preparation for  your procedure:   Contrast Allergy: No  Stop taking, Lisinopril (Zestril or Prinivil) Tuesday, July 19,  Do not take Hydrochlorothiazide the morning of your procedure   Do not take Diabetes Med Glucophage (Metformin) on the day of the procedure and HOLD 48 HOURS AFTER THE PROCEDURE.  On the morning of your procedure, take your Aspirin and any morning  medicines NOT listed above.  You may use sips of water.  5. Plan for one night stay--bring personal belongings. 6. Bring a current list of your medications and current insurance cards. 7. You MUST have a responsible person to drive you home. 8. Someone MUST be with you the first 24 hours after you arrive home or your discharge will be delayed. 9. Please wear clothes that are easy to get on and off and wear slip-on shoes.  Thank you for allowing [place] to care for you!   --  Invasive Cardiovascular services

## 2020-10-23 NOTE — H&P (View-Only) (Signed)
Cardiology Office Note    Date:  10/24/2020   ID:  Ricky Blackwell, DOB 1960-05-17, MRN [id number]  PCP:  Adaline Sill, NP  Cardiologist: Jenkins Rouge, MD    Chief Complaint  Patient presents with   Follow-up    Abnormal Stress Test    History of Present Illness:    Ricky Blackwell is a 60 y.o. male with past medical history of Crohn's disease, Type 2 DM and HTN who presents to the office today for follow-up of his abnormal stress test.  He was examined by Dr. Johnsie Cancel on 10/14/2020 for evaluation of chest discomfort which was not necessarily associated with exertion but could be worse with the stress or anxiety. He did have known acid reflux and some solid food dysphagia and GI had recommended cardiac evaluation prior to his EGD. His pain was overall felt to be atypical but given his cardiac risk factors, an Exercise Myoview was recommended for further evaluation. 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 Johnsie Cancel recommended a cardiac catheterization for definitive evaluation.  In talking with the patient today, he reports still having intermittent episodes of chest pain. He had an episode on Tuesday while at work and EMS was called out but he did not require transport. Took TUMS and NTG with minimal improvement initially then symptoms resolved. Was not triggered by food consumption but prior episodes of chest pain have been at times. He does report dyspnea on exertion. No specific orthopnea, PND or pitting edema.    Past Medical History:  Diagnosis Date   Anal stenosis    Arthritis    Colitis    COVID-19 04/2020   Crohn's disease ([place]) 1993   H/O hiatal hernia    History of kidney stones    Hypertension    Irritable bowel syndrome    Pneumonia    hx   Rectal bleeding    Unspecified constipation     Past Surgical History:  Procedure Laterality Date    ABDOMINAL HERNIA REPAIR  2010   BACK SURGERY  2000   BIOPSY  05/21/2020   Procedure: BIOPSY;  Surgeon: Daneil Dolin, MD;  [facility];  Service: Endoscopy;;   COLONOSCOPY  05/2004   Dr. [facility]- crohns    COLONOSCOPY  08/20/2008   Dr. Gala Romney- normal distal rectum, proximal rectum, scattered 1-2 mm aphthous ulcers, these extended into the distal sigmoid.   COLONOSCOPY N/A 10/03/2014   [name]: findings c/w mildly active ileocolonic crohn's with scattered aphthous ulcers in the distal sigmoid colon, descending colon, ascending colon, terminal ileum. Transverse colon was normal.   COLONOSCOPY WITH PROPOFOL N/A 05/25/2017   Procedure: COLONOSCOPY WITH PROPOFOL;  Surgeon: Daneil Dolin, MD;  scattered ulcers and erythema involving the rectum colon and terminal ileum consistent with Crohn's disease, one benign 4 mm sessile polyp, s/p frequent segmental biopsies.  Pathology with chronic mildly active ileitis and mildly active colitis in ascending colon, sigmoid colon, and rectum.  Repeat in 2 years.   COLONOSCOPY WITH PROPOFOL N/A 05/21/2020    Surgeon: Daneil Dolin, MD;  IBD with skipped involvement of the colon endoscopically consistent with Crohn's colitis s/p segmental biopsy.  Normal-appearing distal TI, ulcerated ileocecal valve.  Pathology with mildly active chronic colitis in the ileocecal valve, moderately active chronic colitis in the descending colon, severely active chronic colitis with ulceration in the sigmoid  colon.    KNEE SURGERY Right 2010   Left Arthoscopic   NECK SURGERY     NECK SURGERY N/A 2005   metal plate in neck   POLYPECTOMY  05/25/2017   Procedure: POLYPECTOMY;  Surgeon: Daneil Dolin, MD;  [facility];  Service: Endoscopy;;  Sigmoid colon (CS)   TOTAL KNEE ARTHROPLASTY Left 02/22/2013   Procedure: LEFT TOTAL KNEE ARTHROPLASTY;  Surgeon: Yvette Rack., MD;  Location: [facility];  Service: Orthopedics;  Laterality: Left;    Current  Medications: Outpatient Medications Prior to Visit  Medication Sig Dispense Refill   cyclobenzaprine (FLEXERIL) 10 MG tablet Take 10 mg by mouth 3 (three) times daily as needed for muscle spasms.     hydrochlorothiazide (MICROZIDE) 12.5 MG capsule Take 12.5 mg by mouth daily.     lisinopril (PRINIVIL,ZESTRIL) 10 MG tablet Take 10 mg by mouth daily.   0   metFORMIN (GLUCOPHAGE) 500 MG tablet Take 500 mg by mouth every evening.     nitroGLYCERIN (NITROSTAT) 0.4 MG SL tablet Place 1 tablet (0.4 mg total) under the tongue every 5 (five) minutes as needed for chest pain. 25 tablet 3   oxyCODONE-acetaminophen (PERCOCET) 5-325 MG tablet Take 1 tablet by mouth every 4 (four) hours as needed for severe pain. 10 tablet 0   predniSONE (DELTASONE) 10 MG tablet Take 2 tablets by mouth daily for 7 days, then 1 tablet by mouth daily for 7 days, then 1/2 tablet by mouth daily for 7 days then stop. 30 tablet 0   tamsulosin (FLOMAX) 0.4 MG CAPS capsule Take 0.4 mg by mouth daily after supper.  10   No facility-administered medications prior to visit.     Allergies:   Toradol [ketorolac tromethamine]   Social History   Socioeconomic History   Marital status: Married    Spouse name: Not on file   Number of children: Not on file   Years of education: Not on file   Highest education level: Not on file  Occupational History   Not on file  Tobacco Use   Smoking status: Former    Packs/day: 2.00    Years: 10.00    Pack years: 20.00    Types: Cigarettes    Quit date: 02/13/1987    Years since quitting: 33.7   Smokeless tobacco: Never  Vaping Use   Vaping Use: Never used  Substance and Sexual Activity   Alcohol use: No   Drug use: No   Sexual activity: Yes    Birth control/protection: None  Other Topics Concern   Not on file  Social History Narrative   Not on file   Social Determinants of Health   Financial Resource Strain: Not on file  Food Insecurity: Not on file  Transportation Needs: Not  on file  Physical Activity: Not on file  Stress: Not on file  Social Connections: Not on file     Family History:  The patient's family history includes Alcoholism in his brother; Arthritis in his brother and sister; COPD in his sister; Cancer - Lung in his father; Colitis in his sister; Colon polyps in his father; Diabetes in his mother; Diabetes type II in his sister; Heart failure in his father and mother; Hypertension in his sister; Irregular heart beat in his brother; Sleep apnea in his sister.   Review of Systems:    Please see the history of present illness.     All other systems reviewed and are otherwise negative except  as noted above.   Physical Exam:    VS:  BP 128/80   Pulse 76   Ht 5' 11.5" (1.816 m)   Wt (!) 314 lb (142.4 kg)   SpO2 97%   BMI 43.18 kg/m    General: Pleasant obese male appearing in no acute distress. Head: Normocephalic, atraumatic. Neck: No carotid bruits. JVD not elevated.  Lungs: Respirations regular and unlabored, without wheezes or rales.  Heart: Regular rate and rhythm. No S3 or S4.  No murmur, no rubs, or gallops appreciated. Abdomen: Appears non-distended. No obvious abdominal masses. Msk:  Strength and tone appear normal for age. No obvious joint deformities or effusions. Extremities: No clubbing or cyanosis. No pitting edema.  Distal pedal pulses are 2+ bilaterally. Neuro: Alert and oriented X 3. Moves all extremities spontaneously. No focal deficits noted. Psych:  Responds to questions appropriately with a normal affect. Skin: No rashes or lesions noted  Wt Readings from Last 3 Encounters:  10/23/20 (!) 314 lb (142.4 kg)  10/14/20 (!) 314 lb (142.4 kg)  08/17/20 (!) 319 lb (144.7 kg)     Studies/Labs Reviewed:   EKG:  EKG is not ordered today. EKG from 10/19/2020 (from NST) is reviewed and shows NSR, HR 70 with TWI along Leads III, V3 and V4.   Recent Labs: 06/30/2020: ALT 18 10/23/2020: BUN 17; Creatinine, Ser 0.90; Hemoglobin  14.5; Platelets 207; Potassium 4.2; Sodium 134   Lipid Panel No results found for: CHOL, TRIG, HDL, CHOLHDL, VLDL, LDLCALC, LDLDIRECT  Additional studies/ records that were reviewed today include:   NST: 10/2020 Blood pressure demonstrated a normal response to exercise. There was no ST segment deviation noted during stress. The left ventricular ejection fraction is normal (55-65%). 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 This is a low risk study. Duke treadmill score of 6.5 supports low risk for major cardiac events.  Assessment:    1. Chest pain, unspecified type   2. Abnormal stress test   3. Essential hypertension   4. Dysphagia, unspecified type      Plan:   In order of problems listed above:  1. Chest Pain/Abnormal Stress Test - He continues to have intermittent episodes of chest pain and some are associated with food consumption while others are not. Recent NST showed findings consistent with prior inferior infarct with mild peri-infarct ischemia and Dr. Johnsie Cancel recommended a cardiac catheterization for definitive evaluation. - The patient understands that risks include but are not limited to stroke (1 in 1000), death (1 in 20), kidney failure [usually temporary] (1 in 500), bleeding (1 in 200), allergic reaction [possibly serious] (1 in 200). Will schedule for next week. Given the physical nature of his job, a work note was provided to remain out his catheterization. Will start ASA 70m daily. Will request labs from PCP as he is not currently on statin therapy. We reviewed he should proceed to the ED in the interim if he has recurrent pain that does not resolve with SL NTG x3.   2. HTN - BP is well-controlled at 128/80 during today's visit. Remains on HCTZ 12.5109mdaily and Lisinopril 1058maily.   3. Dysphagia - EGD is currently on hold given his abnormal stress test and pending catheterization.    Medication  Adjustments/Labs and Tests Ordered: Current medicines are reviewed at length with the patient today.  Concerns regarding medicines are outlined above.  Medication changes, Labs and Tests ordered today are listed in the Patient Instructions below.  Patient Instructions  Medication Instructions:  Your physician recommends that you continue on your current medications as directed. Please refer to the Current Medication list given to you today.  Start Aspirin 81 mg Daily   *If you need a refill on your cardiac medications before your next appointment, please call your pharmacy*   Lab Work: NONE  If you have labs (blood work) drawn today and your tests are completely normal, you will receive your results only by: [place] (if you have MyChart) OR A paper copy in the mail If you have any lab test that is abnormal or we need to change your treatment, we will call you to review the results.   Testing/Procedures: Your physician has requested that you have a cardiac catheterization. Cardiac catheterization is used to diagnose and/or treat various heart conditions. Doctors may recommend this procedure for a number of different reasons. The most common reason is to evaluate chest pain. Chest pain can be a symptom of coronary artery disease (CAD), and cardiac catheterization can show whether plaque is narrowing or blocking your heart's arteries. This procedure is also used to evaluate the valves, as well as measure the blood flow and oxygen levels in different parts of your heart. For further information please visit [email or web address]. Please follow instruction sheet, as given.    Follow-Up: At [facility], you and your health needs are our priority.  As part of our continuing mission to provide you with exceptional heart care, we have created designated Provider Care Teams.  These Care Teams include your primary Cardiologist (physician) and Advanced Practice Providers (APPs -  Physician  Assistants and Nurse Practitioners) who all work together to provide you with the care you need, when you need it.  We recommend signing up for the patient portal called "MyChart".  Sign up information is provided on this After Visit Summary.  MyChart is used to connect with patients for Virtual Visits (Telemedicine).  Patients are able to view lab/test results, encounter notes, upcoming appointments, etc.  Non-urgent messages can be sent to your provider as well.   To learn more about what you can do with MyChart, go to [email or web address].    Your next appointment:   3 week(s)  The format for your next appointment:   In Person  Provider:   Jenkins Rouge, MD or Bernerd Pho, PA-C   Other Instructions Thank you for choosing [facility]!     [address] [place] [address] Dept: [phone] Loc: [phone]  JAQWAN WIEBER  10/23/2020  You are scheduled for a Cardiac Catheterization on Tuesday, July 19 with Dr. Glenetta Hew.  1. Please arrive at the [facility] (Main Entrance A) at [facility]: [address] [place], [place] [address] at 10:00 AM (This time is two hours before your procedure to ensure your preparation). Free valet parking service is available.   Special note: Every effort is made to have your procedure done on time. Please understand that emergencies sometimes delay scheduled procedures.  2. Diet: Do not eat solid foods after midnight.  The patient may have clear liquids until 5am upon the day of the procedure.  3. Labs: You will need to have blood drawn on Friday, July 15 at [facility] [address], [place]  Open: 7am - 6pm, Sat 8am - 12 noon   Phone: [phone]. You do not need to be fasting.  4. Medication instructions  in preparation for your procedure:   Contrast Allergy: No  Stop taking, Lisinopril (Zestril or Prinivil)  Tuesday, July 19,  Do not take Hydrochlorothiazide the morning of your procedure   Do not take Diabetes Med Glucophage (Metformin) on the day of the procedure and HOLD 48 HOURS AFTER THE PROCEDURE.  On the morning of your procedure, take your Aspirin and any morning medicines NOT listed above.  You may use sips of water.  5. Plan for one night stay--bring personal belongings. 6. Bring a current list of your medications and current insurance cards. 7. You MUST have a responsible person to drive you home. 8. Someone MUST be with you the first 24 hours after you arrive home or your discharge will be delayed. 9. Please wear clothes that are easy to get on and off and wear slip-on shoes.  Thank you for allowing [place] to care for you!   -- [facility]   Signed, Erma Heritage, PA-C  10/24/2020 1:28 PM    [facility] [address] [place], [place] [address] Phone: [phone] Fax: [phone]

## 2020-10-24 ENCOUNTER — Encounter: Payer: Self-pay | Admitting: Student

## 2020-10-26 ENCOUNTER — Telehealth: Payer: Self-pay | Admitting: *Deleted

## 2020-10-26 NOTE — Telephone Encounter (Signed)
Mailbox full, unable to leave message

## 2020-10-26 NOTE — Telephone Encounter (Signed)
Pt contacted pre-catheterization scheduled at [facility] for: Tuesday October 28, 2018 12:30 PM Verified arrival time and place: [facility]) at: 10:30 AM   No solid food after midnight prior to cath, clear liquids until 5 AM day of procedure.  Hold: HCTZ-AM of procedure Metformin-day of procedure and 48 hours post procedure  Except hold medications AM meds can be  taken pre-cath with sips of water including: aspirin 81 mg   Confirmed patient has responsible adult to drive home post procedure and be with patient first 24 hours after arriving home:  You are allowed ONE visitor in the waiting room during the time you are at the hospital for your procedure. Both you and your visitor must wear a mask once you enter the hospital.   Patient reports does not currently have any symptoms concerning for COVID-19 and no household members with COVID-19 like illness.   Call placed to patient to review procedure instructions, mailbox full, unable to leave message.

## 2020-10-27 ENCOUNTER — Encounter (HOSPITAL_COMMUNITY)
Admission: RE | Disposition: A | Discharge status: Home / Self Care | Payer: Self-pay | Source: Home / Self Care | Attending: Cardiology

## 2020-10-27 ENCOUNTER — Other Ambulatory Visit: Payer: Self-pay

## 2020-10-27 ENCOUNTER — Ambulatory Visit (HOSPITAL_COMMUNITY)
Admission: RE | Admit: 2020-10-27 | Discharge: 2020-10-27 | Disposition: A | Discharge status: Home / Self Care | Payer: Commercial Managed Care - PPO | Attending: Cardiology | Admitting: Cardiology

## 2020-10-27 DIAGNOSIS — Z885 Allergy status to narcotic agent status: Secondary | ICD-10-CM | POA: Diagnosis not present

## 2020-10-27 DIAGNOSIS — R1310 Dysphagia, unspecified: Secondary | ICD-10-CM | POA: Diagnosis not present

## 2020-10-27 DIAGNOSIS — Z87891 Personal history of nicotine dependence: Secondary | ICD-10-CM | POA: Diagnosis not present

## 2020-10-27 DIAGNOSIS — I10 Essential (primary) hypertension: Secondary | ICD-10-CM | POA: Diagnosis not present

## 2020-10-27 DIAGNOSIS — I200 Unstable angina: Secondary | ICD-10-CM

## 2020-10-27 DIAGNOSIS — Z8249 Family history of ischemic heart disease and other diseases of the circulatory system: Secondary | ICD-10-CM | POA: Diagnosis not present

## 2020-10-27 DIAGNOSIS — R9439 Abnormal result of other cardiovascular function study: Secondary | ICD-10-CM | POA: Diagnosis present

## 2020-10-27 DIAGNOSIS — K5090 Crohn's disease, unspecified, without complications: Secondary | ICD-10-CM | POA: Insufficient documentation

## 2020-10-27 DIAGNOSIS — Z79899 Other long term (current) drug therapy: Secondary | ICD-10-CM | POA: Insufficient documentation

## 2020-10-27 DIAGNOSIS — E119 Type 2 diabetes mellitus without complications: Secondary | ICD-10-CM | POA: Insufficient documentation

## 2020-10-27 DIAGNOSIS — E785 Hyperlipidemia, unspecified: Secondary | ICD-10-CM | POA: Insufficient documentation

## 2020-10-27 DIAGNOSIS — Z7984 Long term (current) use of oral hypoglycemic drugs: Secondary | ICD-10-CM | POA: Diagnosis not present

## 2020-10-27 HISTORY — PX: LEFT HEART CATH AND CORONARY ANGIOGRAPHY: CATH118249

## 2020-10-27 LAB — GLUCOSE, CAPILLARY: Glucose-Capillary: 147 mg/dL — ABNORMAL HIGH (ref 70–99)

## 2020-10-27 SURGERY — LEFT HEART CATH AND CORONARY ANGIOGRAPHY
Anesthesia: LOCAL

## 2020-10-27 MED ORDER — SODIUM CHLORIDE 0.9 % IV SOLN: 250.0000 mL | INTRAVENOUS | Status: DC | PRN

## 2020-10-27 MED ORDER — HEPARIN (PORCINE) IN NACL 1000-0.9 UT/500ML-% IV SOLN
INTRAVENOUS | Status: DC | PRN
  Administered 2020-10-27 (×2): 500 mL

## 2020-10-27 MED ORDER — HYDRALAZINE HCL 20 MG/ML IJ SOLN: 10.0000 mg | INTRAMUSCULAR | Status: DC | PRN

## 2020-10-27 MED ORDER — AMLODIPINE BESYLATE 2.5 MG PO TABS: 2.5000 mg | ORAL_TABLET | Freq: Every day | ORAL | 11 refills | Status: DC

## 2020-10-27 MED ORDER — SODIUM CHLORIDE 0.9% FLUSH: 3.0000 mL | INTRAVENOUS | Status: DC | PRN

## 2020-10-27 MED ORDER — FENTANYL CITRATE (PF) 100 MCG/2ML IJ SOLN
INTRAMUSCULAR | Status: AC
  Filled 2020-10-27: qty 2

## 2020-10-27 MED ORDER — HEPARIN SODIUM (PORCINE) 1000 UNIT/ML IJ SOLN
INTRAMUSCULAR | Status: AC
  Filled 2020-10-27: qty 1

## 2020-10-27 MED ORDER — SODIUM CHLORIDE 0.9 % WEIGHT BASED INFUSION: 1.0000 mL/kg/h | INTRAVENOUS | Status: DC

## 2020-10-27 MED ORDER — MIDAZOLAM HCL 2 MG/2ML IJ SOLN
INTRAMUSCULAR | Status: DC | PRN
  Administered 2020-10-27: 1 mg via INTRAVENOUS

## 2020-10-27 MED ORDER — SODIUM CHLORIDE 0.9% FLUSH: 3.0000 mL | Freq: Two times a day (BID) | INTRAVENOUS | Status: DC

## 2020-10-27 MED ORDER — ASPIRIN 81 MG PO CHEW: 81.0000 mg | CHEWABLE_TABLET | ORAL | Status: DC

## 2020-10-27 MED ORDER — LIDOCAINE HCL (PF) 1 % IJ SOLN
INTRAMUSCULAR | Status: AC
  Filled 2020-10-27: qty 30

## 2020-10-27 MED ORDER — ACETAMINOPHEN 325 MG PO TABS: 650.0000 mg | ORAL_TABLET | ORAL | Status: DC | PRN

## 2020-10-27 MED ORDER — LIDOCAINE HCL (PF) 1 % IJ SOLN
INTRAMUSCULAR | Status: DC | PRN
  Administered 2020-10-27: 2 mL

## 2020-10-27 MED ORDER — MIDAZOLAM HCL 2 MG/2ML IJ SOLN
INTRAMUSCULAR | Status: AC
  Filled 2020-10-27: qty 2

## 2020-10-27 MED ORDER — VERAPAMIL HCL 2.5 MG/ML IV SOLN
INTRAVENOUS | Status: AC
  Filled 2020-10-27: qty 2

## 2020-10-27 MED ORDER — LABETALOL HCL 5 MG/ML IV SOLN: 10.0000 mg | INTRAVENOUS | Status: DC | PRN

## 2020-10-27 MED ORDER — IOHEXOL 350 MG/ML SOLN
INTRAVENOUS | Status: DC | PRN
  Administered 2020-10-27: 80 mL

## 2020-10-27 MED ORDER — ONDANSETRON HCL 4 MG/2ML IJ SOLN: 4.0000 mg | Freq: Four times a day (QID) | INTRAMUSCULAR | Status: DC | PRN

## 2020-10-27 MED ORDER — SODIUM CHLORIDE 0.9 % WEIGHT BASED INFUSION
3.0000 mL/kg/h | INTRAVENOUS | Status: AC
  Administered 2020-10-27: 3 mL/kg/h via INTRAVENOUS

## 2020-10-27 MED ORDER — SODIUM CHLORIDE 0.9 % IV SOLN: INTRAVENOUS | Status: DC

## 2020-10-27 MED ORDER — VERAPAMIL HCL 2.5 MG/ML IV SOLN
INTRAVENOUS | Status: DC | PRN
  Administered 2020-10-27: 10 mL via INTRA_ARTERIAL

## 2020-10-27 MED ORDER — FENTANYL CITRATE (PF) 100 MCG/2ML IJ SOLN
INTRAMUSCULAR | Status: DC | PRN
  Administered 2020-10-27: 25 ug via INTRAVENOUS

## 2020-10-27 MED ORDER — HEPARIN SODIUM (PORCINE) 1000 UNIT/ML IJ SOLN
INTRAMUSCULAR | Status: DC | PRN
  Administered 2020-10-27: 7000 [IU] via INTRAVENOUS

## 2020-10-27 SURGICAL SUPPLY — 12 items
CATH INFINITI 5 FR 3DRC (CATHETERS) ×2 IMPLANT
CATH INFINITI 5FR ANG PIGTAIL (CATHETERS) ×2 IMPLANT
CATH OPTITORQUE TIG 4.0 5F (CATHETERS) ×2 IMPLANT
DEVICE RAD COMP TR BAND LRG (VASCULAR PRODUCTS) ×2 IMPLANT
GLIDESHEATH SLEND SS 6F .021 (SHEATH) ×2 IMPLANT
GUIDEWIRE INQWIRE 1.5J.035X260 (WIRE) ×1 IMPLANT
INQWIRE 1.5J .035X260CM (WIRE) ×2
KIT HEART LEFT (KITS) ×2 IMPLANT
PACK CARDIAC CATHETERIZATION (CUSTOM PROCEDURE TRAY) ×2 IMPLANT
SHEATH PROBE COVER 6X72 (BAG) ×2 IMPLANT
TRANSDUCER W/STOPCOCK (MISCELLANEOUS) ×2 IMPLANT
TUBING CIL FLEX 10 FLL-RA (TUBING) ×2 IMPLANT

## 2020-10-27 NOTE — Brief Op Note (Signed)
   BRIEF CARDIAC CATHETERIZATION REPORT  10/27/2020  4:10 PM   SURGEON:  Surgeon(s) and Role:    * Leonie Man, MD - Primary   PROCEDURE:  Procedure(s): LEFT HEART CATH AND CORONARY ANGIOGRAPHY (N/A)  PATIENT:  Ricky Blackwell  60 y.o. obese male with hypertension, hyperlipidemia, Crohn's disease, DM-2 evaluated by Dr. Johnsie Cancel on 10/14/2020 for chest discomfort partially at rest but also with stress and postprandial.  He was evaluated with exercise Myoview that showed normal EKG but inferior infarct with mild peri-infarct ischemia.  Based on symptoms and possible rest ischemia, he was referred for cardiac catheterization and possible PCI.  PRE-OPERATIVE DIAGNOSIS: Progressive angina; abnormal myoview suggesting inferior infarct  POST-OPERATIVE DIAGNOSIS:   False Positive Nuclear Stress Test  Angiographically Normal Coronary Arteries with Codominant system and Ramus Intermedius. Normal LVEDP with low normal EF roughly 50%-mild global HK.  Time Out: Verified patient identification, verified procedure, site/side was marked, verified correct patient position, special equipment/implants available, medications/allergies/relevent history reviewed, required imaging and test results available. Performed.  Access:  RIGHT Radial Artery: 6 Fr sheath -- Seldinger technique using Micropuncture Kit -- Direct ultrasound guidance used.  Permanent image obtained and placed on chart. -- 10 mL radial cocktail IA; 7000 Units IV Heparin  Left Heart Catheterization: 5Fr Catheters advanced or exchanged over a J-wire under direct fluoroscopic guidance into the ascending aorta; TIG 4.0 catheter advanced first.  * LV Hemodynamics (LV Gram): Angled Pigtail Catheter * Left Coronary Artery Cineangiography: TIG 4.0 Catheter  * Right Coronary Artery Cineangiography: 3DRC Catheter   Review of initial angiography revealed: Angiographically normal coronary arteries  Preparations are made for addition of  amlodipine to treat potential Prinzmetal angina  Upon completion of Angiogaphy, the catheter was removed completely out of the body over a wire, without complication.   Radial sheath removed in the Cardiac Catheterization lab with TR Band placed for hemostasis.  TR Band: 1600 Hours; 13 mL air  MEDICATIONS SQ Lidocaine 3 mL Radial Cocktail: 3 mg Verapmil in 10 mL NS Heparin: 7000 units  EBL:  <17 mL COMPLICATIONS: None  COUNTS:  YES  DICTATION: .Note written in EPIC  PLAN OF CARE: Discharge to home after PACU; added amlodipine to treat possible spasm says most of her symptoms are resting pain.  PATIENT DISPOSITION:  PACU - hemodynamically stable.   Delay start of Pharmacological VTE agent (>24hrs) due to surgical blood loss or risk of bleeding: not applicable   Glenetta Hew, MD

## 2020-10-27 NOTE — Interval H&P Note (Signed)
History and Physical Interval Note:  10/27/2020 3:29 PM  Ricky Blackwell  has presented today for surgery, with the diagnosis of progressive angina and abnormal myoview.  The various methods of treatment have been discussed with the patient and family. After consideration of risks, benefits and other options for treatment, the patient has consented to  Procedure(s): LEFT HEART CATH AND CORONARY ANGIOGRAPHY (N/A)  PERCUTANEOUS CORONARY INTERVENTION  as a surgical intervention.  The patient's history has been reviewed, patient examined, no change in status, stable for surgery.  I have reviewed the patient's chart and labs.  Questions were answered to the patient's satisfaction.    Cath Lab Visit (complete for each Cath Lab visit)  Clinical Evaluation Leading to the Procedure:   ACS: No.  Non-ACS:    Anginal Classification: CCS IV  Anti-ischemic medical therapy: Minimal Therapy (1 class of medications)  Non-Invasive Test Results: Intermediate-risk stress test findings: cardiac mortality 1-3%/year / Equivocal results  Prior CABG: No previous CABG    Glenetta Hew

## 2020-10-27 NOTE — Telephone Encounter (Signed)
Pt at hospital for cath.

## 2020-10-28 ENCOUNTER — Encounter (HOSPITAL_COMMUNITY): Payer: Self-pay | Admitting: Cardiology

## 2020-10-29 ENCOUNTER — Ambulatory Visit: Payer: Commercial Managed Care - PPO | Admitting: Cardiovascular Disease

## 2020-11-02 ENCOUNTER — Ambulatory Visit: Payer: Commercial Managed Care - PPO | Admitting: Gastroenterology

## 2020-11-02 NOTE — Progress Notes (Signed)
CARDIOLOGY CONSULT NOTE       Patient ID: Ricky Blackwell MRN: [id number] DOB/AGE: [birth year] 60 y.o.  Admit date: (Not on file) Referring Physician: Aliene Altes PA Primary Physician: Adaline Sill, NP Primary Cardiologist: New Reason for Consultation: Chest Pain   HPI:  60 y.o. with advanced Crohn's disease Recently started on Humira. Seen by GI 08/17/20 and complained of central chest discomfort. Not exertional can be constant. Worse with stress and anxiety Does have some GERD and solid food dysphagia GI wanted cardiac evaluation prior to EGD. He has no history of cardiac issues. No vascular disease Previous smoker with DM and HTN.    Pain is atypical Has two types One for last 6 months sharp non exertional use to last seconds now minutes Nothing makes it better   2nd pain is more GI related to food difficulty swallowing and spasm like   He cut his left arm with box knife earlier in week Has had tetanus shot February   Chronic venous disease in legs not helped by obesity   Myovue done 10/19/20 showed ? Inferior infarct with peri infarct ischemia EF normal  Cath done 10/27/20 normal no CAD normal filling pressures false positive myovue  Has f/u with GI [name] in August  Clear to have EGD      ROS All other systems reviewed and negative except as noted above  Past Medical History:  Diagnosis Date   Anal stenosis    Arthritis    Colitis    COVID-19 04/2020   Crohn's disease ([place]) 1993   H/O hiatal hernia    History of kidney stones    Hypertension    Irritable bowel syndrome    Pneumonia    hx   Rectal bleeding    Unspecified constipation     Family History  Problem Relation Age of Onset   Diabetes Mother    Heart failure Mother    Heart failure Father    Colon polyps Father    Cancer - Lung Father    COPD Sister    Diabetes type II Sister    Hypertension Sister    Sleep apnea Sister    Arthritis Sister    Colitis Sister        Maybe?   Irregular  heart beat Brother    Alcoholism Brother    Arthritis Brother    Colon cancer Neg Hx    Inflammatory bowel disease Neg Hx     Social History   Socioeconomic History   Marital status: Married    Spouse name: Not on file   Number of children: Not on file   Years of education: Not on file   Highest education level: Not on file  Occupational History   Not on file  Tobacco Use   Smoking status: Former    Packs/day: 2.00    Years: 10.00    Pack years: 20.00    Types: Cigarettes    Quit date: 02/13/1987    Years since quitting: 33.7   Smokeless tobacco: Never  Vaping Use   Vaping Use: Never used  Substance and Sexual Activity   Alcohol use: No   Drug use: No   Sexual activity: Yes    Birth control/protection: None  Other Topics Concern   Not on file  Social History Narrative   Not on file   Social Determinants of Health   Financial Resource Strain: Not on file  Food Insecurity: Not on file  Transportation Needs: Not  on file  Physical Activity: Not on file  Stress: Not on file  Social Connections: Not on file  Intimate Partner Violence: Not on file    Past Surgical History:  Procedure Laterality Date   ABDOMINAL HERNIA REPAIR  2010   [name]   BIOPSY  05/21/2020   Procedure: BIOPSY;  Surgeon: Daneil Dolin, MD;  [facility];  Service: Endoscopy;;   COLONOSCOPY  05/2004   [facility]- crohns    COLONOSCOPY  08/20/2008   Dr. Gala Romney- normal distal rectum, proximal rectum, scattered 1-2 mm aphthous ulcers, these extended into the distal sigmoid.   COLONOSCOPY N/A 10/03/2014   [name]: findings c/w mildly active ileocolonic crohn's with scattered aphthous ulcers in the distal sigmoid colon, descending colon, ascending colon, terminal ileum. Transverse colon was normal.   COLONOSCOPY WITH PROPOFOL N/A 05/25/2017   Procedure: COLONOSCOPY WITH PROPOFOL;  Surgeon: Daneil Dolin, MD;  scattered ulcers and erythema involving the rectum colon and  terminal ileum consistent with Crohn's disease, one benign 4 mm sessile polyp, s/p frequent segmental biopsies.  Pathology with chronic mildly active ileitis and mildly active colitis in ascending colon, sigmoid colon, and rectum.  Repeat in 2 years.   COLONOSCOPY WITH PROPOFOL N/A 05/21/2020    Surgeon: Daneil Dolin, MD;  IBD with skipped involvement of the colon endoscopically consistent with Crohn's colitis s/p segmental biopsy.  Normal-appearing distal TI, ulcerated ileocecal valve.  Pathology with mildly active chronic colitis in the ileocecal valve, moderately active chronic colitis in the descending colon, severely active chronic colitis with ulceration in the sigmoid colon.    KNEE SURGERY Right 2010   Left Arthoscopic   LEFT HEART CATH AND CORONARY ANGIOGRAPHY N/A 10/27/2020   Procedure: LEFT HEART CATH AND CORONARY ANGIOGRAPHY;  Surgeon: Leonie Man, MD;  Location: [facility];  Service: Cardiovascular;  Laterality: N/A;   NECK SURGERY     NECK SURGERY N/A 2005   metal plate in neck   POLYPECTOMY  05/25/2017   Procedure: POLYPECTOMY;  Surgeon: Daneil Dolin, MD;  [facility];  Service: Endoscopy;;  Sigmoid colon (CS)   TOTAL KNEE ARTHROPLASTY Left 02/22/2013   Procedure: LEFT TOTAL KNEE ARTHROPLASTY;  Surgeon: Yvette Rack., MD;  Location: [facility];  Service: Orthopedics;  Laterality: Left;      Current Outpatient Medications:    amLODipine (NORVASC) 2.5 MG tablet, Take 1 tablet (2.5 mg total) by mouth daily., Disp: 30 tablet, Rfl: 11   cyclobenzaprine (FLEXERIL) 10 MG tablet, Take 10 mg by mouth 3 (three) times daily as needed for muscle spasms., Disp: , Rfl:    hydrochlorothiazide (MICROZIDE) 12.5 MG capsule, Take 12.5 mg by mouth daily., Disp: , Rfl:    lisinopril (PRINIVIL,ZESTRIL) 10 MG tablet, Take 10 mg by mouth daily. , Disp: , Rfl: 0   metFORMIN (GLUCOPHAGE) 500 MG tablet, Take 500 mg by mouth every evening., Disp: , Rfl:    nitroGLYCERIN (NITROSTAT)  0.4 MG SL tablet, Place 1 tablet (0.4 mg total) under the tongue every 5 (five) minutes as needed for chest pain., Disp: 25 tablet, Rfl: 3   oxyCODONE-acetaminophen (PERCOCET) 5-325 MG tablet, Take 1 tablet by mouth every 4 (four) hours as needed for severe pain., Disp: 10 tablet, Rfl: 0   predniSONE (DELTASONE) 10 MG tablet, Take 2 tablets by mouth daily for 7 days, then 1 tablet by mouth daily for 7 days, then 1/2 tablet by mouth daily  for 7 days then stop., Disp: 30 tablet, Rfl: 0   tamsulosin (FLOMAX) 0.4 MG CAPS capsule, Take 0.4 mg by mouth daily after supper., Disp: , Rfl: 10    Physical Exam: Blood pressure 130/70, pulse 94, height 5' 11.5" (1.816 m), weight (!) 141.6 kg, SpO2 95 %.    Affect appropriate Obese male  HEENT: normal Neck supple with no adenopathy JVP normal no bruits no thyromegaly Lungs clear with no wheezing and good diaphragmatic motion Heart:  S1/S2 no murmur, no rub, gallop or click PMI normal Abdomen: benighn, BS positve, no tenderness, no AAA no bruit.  No HSM or HJR Distal pulses intact with no bruits Plus one edema with chronic venous stasis changes  Neuro non-focal Post left TKR  Right radial cath site healed well mild bruising    Labs:   Lab Results  Component Value Date   WBC 7.7 10/23/2020   HGB 14.5 10/23/2020   HCT 43.3 10/23/2020   MCV 92.1 10/23/2020   PLT 207 10/23/2020   No results for input(s): NA, K, CL, CO2, BUN, CREATININE, CALCIUM, PROT, BILITOT, ALKPHOS, ALT, AST, GLUCOSE in the last 168 hours.  Invalid input(s): LABALBU No results found for: CKTOTAL, CKMB, CKMBINDEX, TROPONINI No results found for: CHOL No results found for: HDL No results found for: LDLCALC No results found for: TRIG No results found for: CHOLHDL No results found for: LDLDIRECT    Radiology: CARDIAC CATHETERIZATION  Result Date: 10/27/2020 Formatting of this result is different from the original.   The left ventricular systolic function is low -normal".   The left ventricular ejection fraction is 50-55% by visual estimate.   LV end diastolic pressure is normal.   Angiographically Normal Coronary Arteries.   Codominant System with Ramus Intermedius SUMMARY False Positive Nuclear Stress Test Angiographically Normal Coronary Arteries with Codominant system and Ramus Intermedius. Normal LVEDP with low normal EF roughly 50%-mild global HK.   NM Myocar Multi W/Spect W/Wall Motion / EF  Result Date: 10/19/2020  Blood pressure demonstrated a normal response to exercise.  There was no ST segment deviation noted during stress.  The left ventricular ejection fraction is normal (55-65%).  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  This is a low risk study. Duke treadmill score of 6.5 supports low risk for major cardiac events.     EKG: 05/19/20 SR LAD poor R wave progression LVH   ASSESSMENT AND PLAN:   Chest Pain: atypical may be related to GERD/dysphagia. False positive myovue with normal cath 10/27/20 HTN:  Well controlled.  Continue current medications and low sodium Dash type diet.   DM:  Discussed low carb diet.  Target hemoglobin A1c is 6.5 or less.  Continue current medications. Colitis :  since 1993 major factor in poor quality of life. Started on Humira has had prednisone taper and vancomycin recently will need EGD for dysphagia after cardiac testing   F/U with cardiology PRN   Signed: Jenkins Rouge 11/04/2020, 3:51 PM

## 2020-11-04 ENCOUNTER — Encounter: Payer: Self-pay | Admitting: Cardiovascular Disease

## 2020-11-04 ENCOUNTER — Ambulatory Visit: Payer: Commercial Managed Care - PPO | Admitting: Cardiovascular Disease

## 2020-11-04 ENCOUNTER — Other Ambulatory Visit: Payer: Self-pay

## 2020-11-04 VITALS — BP 130/70 | HR 94 | Ht 71.5 in | Wt 312.2 lb

## 2020-11-04 DIAGNOSIS — E118 Type 2 diabetes mellitus with unspecified complications: Secondary | ICD-10-CM

## 2020-11-04 DIAGNOSIS — I10 Essential (primary) hypertension: Secondary | ICD-10-CM

## 2020-11-04 DIAGNOSIS — R079 Chest pain, unspecified: Secondary | ICD-10-CM | POA: Diagnosis not present

## 2020-11-04 NOTE — Patient Instructions (Signed)
Medication Instructions:  Your physician recommends that you continue on your current medications as directed. Please refer to the Current Medication list given to you today.  *If you need a refill on your cardiac medications before your next appointment, please call your pharmacy*   Lab Work: NONE   If you have labs (blood work) drawn today and your tests are completely normal, you will receive your results only by: [facility] (if you have MyChart) OR A paper copy in the mail If you have any lab test that is abnormal or we need to change your treatment, we will call you to review the results.   Testing/Procedures: NONE    Follow-Up: At [facility], you and your health needs are our priority.  As part of our continuing mission to provide you with exceptional heart care, we have created designated Provider Care Teams.  These Care Teams include your primary Cardiologist (physician) and Advanced Practice Providers (APPs -  Physician Assistants and Nurse Practitioners) who all work together to provide you with the care you need, when you need it.  We recommend signing up for the patient portal called "MyChart".  Sign up information is provided on this After Visit Summary.  MyChart is used to connect with patients for Virtual Visits (Telemedicine).  Patients are able to view lab/test results, encounter notes, upcoming appointments, etc.  Non-urgent messages can be sent to your provider as well.   To learn more about what you can do with MyChart, go to [email or web address].    Your next appointment:    As Needed   The format for your next appointment:   In Person  Provider:   Jenkins Rouge, MD   Other Instructions Thank you for choosing [facility]!

## 2020-11-25 NOTE — Progress Notes (Signed)
Referring Provider: Adaline Sill, NP Primary Care Physician:  Adaline Sill, NP Primary GI Physician: Dr. Gala Romney  Chief Complaint  Patient presents with   Crohn's Disease    F/u on humira   change in bowels    Reports Ricky Blackwell can go 1-2 days w/o BM, straining when Ricky Blackwell does    HPI:   Ricky Blackwell is a 60 y.o. male with a history of  ileocolonic Crohn's disease, diagnosed in 1993, recently started on Humira due to recurrent flares on Lialda, C. difficile April 2022 treated with vancomycin, occasional GERD symptoms, and reported solid food dysphagia in May 2022.  Ricky Blackwell is presenting today for follow-up.  At the time of his last office visit in May, Ricky Blackwell was on Entocort, but still having significant diarrhea with associated rectal bleeding, mild RLQ and LLQ abdominal pain 2 to 4 days a week lasting a couple hours.   Reported a boil on his buttock that was tender.  On exam, Ricky Blackwell had a 5 to 6 cm round lesion that was consistent with a skin abscess.  Occasional nausea without vomiting.  Rare GERD, but reported solid food dysphagia.  Also reported central chest discomfort associated with stress and anxiety, increasing in frequency.  Due to reports of worsening diarrhea, plan to update labs and recheck stool studies, if negative, start Humira.  For skin abscess on buttock, recommended supportive measures with hot compress/warm water soaks.  Hold off on antibiotics.  Recommended follow-up with PCP if symptoms worsen or do not improve over the next week.  Regarding dysphagia, patient needed EGD, but recommended cardiac evaluation of reported chest pain prior to scheduling.  Stool studies were negative.  CBC and BMP with no significant abnormalities. CRP elevated 17.9, sed rate 9.  Due to ongoing diarrhea, patient was started on prednisone taper and planned to transition to Humira. Patient received his first injection of Humira on 6/23.  Ricky Blackwell was continued on prednisone 30 mg daily until Ricky Blackwell completed his  second Humira injection on July 6, then start 3 week taper.   Ricky Blackwell had evaluation of chest pain by cardiology: Myovue done 10/19/20 showed ? Inferior infarct with peri infarct ischemia EF normal  Cath done 10/27/20 normal no CAD normal filling pressures false positive myovue.  Cleared to have EGD.   Today:   Crohn's: Feeling much improved on Humira.  Stopped prednisone about 1 month ago. Abdominal pain and rectal bleeding have resolved.  Bowel movements are much improved.  Ricky Blackwell is actually having a little constipation now.  States Ricky Blackwell can skip a day here and there between bowel movements, associated straining.  When Ricky Blackwell does have a bowel movement, his stools are formed which is something Ricky Blackwell has not experienced in many years.  Some associated abdominal cramping prior to a bowel movement if Ricky Blackwell is feeling constipated.  Abdominal pain resolves once Ricky Blackwell has a bowel movement.   Ricky Blackwell is having some trouble with his right knee and is taking a pain pill every now and then which may also be influencing his constipation.  Ricky Blackwell is limiting this is much as possible.    Most recent dose of Humira was yesterday.  States Ricky Blackwell takes it every 2 weeks on Wednesday evening.  No NSAIDs aside from 81 mg aspirin.   No fever, chills, cold or flulike symptoms.  Vaccinations: Thinks Ricky Blackwell had a tetanus shot last year. Ricky Blackwell gets a flu shot every year. Does not think Ricky Blackwell is ever received a pneumonia  vaccine. No immunity to hepatitis A or B.   Dysphagia:  Continues with intermittent solid food dysphagia.  Once every few weeks.  Continues with central chest pain.  Intermittent.  Seems to be more frequent.  Feels like a soreness.  Can stay for hours.  Does not seem to be associated with meals.  Ricky Blackwell has tried Tums and did not notice any improvement.  Typical reflux symptoms are very rare.  Past Medical History:  Diagnosis Date   Anal stenosis    Arthritis    Colitis    COVID-19 04/2020   Crohn's disease ([place]) 1993   H/O hiatal  hernia    History of kidney stones    Hypertension    Irritable bowel syndrome    Pneumonia    hx   Rectal bleeding    Unspecified constipation     Past Surgical History:  Procedure Laterality Date   ABDOMINAL HERNIA REPAIR  2010   BACK SURGERY  2000   BIOPSY  05/21/2020   Procedure: BIOPSY;  Surgeon: Daneil Dolin, MD;  [facility];  Service: Endoscopy;;   COLONOSCOPY  05/2004   [facility]. [facility]- crohns    COLONOSCOPY  08/20/2008   Dr. Gala Romney- normal distal rectum, proximal rectum, scattered 1-2 mm aphthous ulcers, these extended into the distal sigmoid.   COLONOSCOPY N/A 10/03/2014   [name]: findings c/w mildly active ileocolonic crohn's with scattered aphthous ulcers in the distal sigmoid colon, descending colon, ascending colon, terminal ileum. Transverse colon was normal.   COLONOSCOPY WITH PROPOFOL N/A 05/25/2017   Procedure: COLONOSCOPY WITH PROPOFOL;  Surgeon: Daneil Dolin, MD;  scattered ulcers and erythema involving the rectum colon and terminal ileum consistent with Crohn's disease, one benign 4 mm sessile polyp, s/p frequent segmental biopsies.  Pathology with chronic mildly active ileitis and mildly active colitis in ascending colon, sigmoid colon, and rectum.  Repeat in 2 years.   COLONOSCOPY WITH PROPOFOL N/A 05/21/2020    Surgeon: Daneil Dolin, MD;  IBD with skipped involvement of the colon endoscopically consistent with Crohn's colitis s/p segmental biopsy.  Normal-appearing distal TI, ulcerated ileocecal valve.  Pathology with mildly active chronic colitis in the ileocecal valve, moderately active chronic colitis in the descending colon, severely active chronic colitis with ulceration in the sigmoid colon.    KNEE SURGERY Right 2010   Left Arthoscopic   LEFT HEART CATH AND CORONARY ANGIOGRAPHY N/A 10/27/2020   Procedure: LEFT HEART CATH AND CORONARY ANGIOGRAPHY;  Surgeon: Leonie Man, MD;  Location: [facility];  Service: Cardiovascular;   Laterality: N/A;   NECK SURGERY     NECK SURGERY N/A 2005   metal plate in neck   POLYPECTOMY  05/25/2017   Procedure: POLYPECTOMY;  Surgeon: Daneil Dolin, MD;  [facility];  Service: Endoscopy;;  Sigmoid colon (CS)   TOTAL KNEE ARTHROPLASTY Left 02/22/2013   Procedure: LEFT TOTAL KNEE ARTHROPLASTY;  Surgeon: Yvette Rack., MD;  Location: [facility];  Service: Orthopedics;  Laterality: Left;    Current Outpatient Medications  Medication Sig Dispense Refill   Adalimumab (HUMIRA [place]) Inject into the skin. Every 2 weeks     amLODipine (NORVASC) 2.5 MG tablet Take 1 tablet (2.5 mg total) by mouth daily. 30 tablet 11   cyclobenzaprine (FLEXERIL) 10 MG tablet Take 10 mg by mouth 3 (three) times daily as needed for muscle spasms.     hydrochlorothiazide (MICROZIDE) 12.5 MG capsule Take 12.5 mg by  mouth daily.     lisinopril (PRINIVIL,ZESTRIL) 10 MG tablet Take 10 mg by mouth daily.   0   metFORMIN (GLUCOPHAGE) 500 MG tablet Take 500 mg by mouth every evening.     nitroGLYCERIN (NITROSTAT) 0.4 MG SL tablet Place 1 tablet (0.4 mg total) under the tongue every 5 (five) minutes as needed for chest pain. 25 tablet 3   omeprazole (PRILOSEC) 20 MG capsule Take 1 capsule (20 mg total) by mouth daily before breakfast. 30 capsule 3   oxyCODONE-acetaminophen (PERCOCET) 5-325 MG tablet Take 1 tablet by mouth every 4 (four) hours as needed for severe pain. 10 tablet 0   tamsulosin (FLOMAX) 0.4 MG CAPS capsule Take 0.4 mg by mouth daily after supper.  10   No current facility-administered medications for this visit.    Allergies as of 11/26/2020 - Review Complete 11/26/2020  Allergen Reaction Noted   Toradol [ketorolac tromethamine]  02/25/2020    Family History  Problem Relation Age of Onset   Diabetes Mother    Heart failure Mother    Heart failure Father    Colon polyps Father    Cancer - Lung Father    COPD Sister    Diabetes type II Sister    Hypertension Sister    Sleep apnea  Sister    Arthritis Sister    Colitis Sister        Maybe?   Irregular heart beat Brother    Alcoholism Brother    Arthritis Brother    Colon cancer Neg Hx    Inflammatory bowel disease Neg Hx     Social History   Socioeconomic History   Marital status: Married    Spouse name: Not on file   Number of children: Not on file   Years of education: Not on file   Highest education level: Not on file  Occupational History   Not on file  Tobacco Use   Smoking status: Former    Packs/day: 2.00    Years: 10.00    Pack years: 20.00    Types: Cigarettes    Quit date: 02/13/1987    Years since quitting: 33.8   Smokeless tobacco: Never  Vaping Use   Vaping Use: Never used  Substance and Sexual Activity   Alcohol use: No   Drug use: No   Sexual activity: Yes    Birth control/protection: None  Other Topics Concern   Not on file  Social History Narrative   Not on file   Social Determinants of Health   Financial Resource Strain: Not on file  Food Insecurity: Not on file  Transportation Needs: Not on file  Physical Activity: Not on file  Stress: Not on file  Social Connections: Not on file    Review of Systems: Gen: Denies fever, chills, cold or flulike symptoms, presyncope, syncope. CV: Denies palpitations.  Admits to central chest pain (see HPI). Resp: Denies dyspnea or cough. GI: See HPI Derm: Denies rash Psych: Denies depression, anxiety Heme: See HPI  Physical Exam: BP 126/65   Pulse 82   Temp (!) 97.1 F (36.2 C)   Ht 6' (1.829 m)   Wt (!) 318 lb (144.2 kg)   BMI 43.13 kg/m  General:  Alert and oriented. No distress noted. Pleasant and cooperative.  Head:  Normocephalic and atraumatic. Eyes:  Conjuctiva clear without scleral icterus. Heart:  S1, S2 present without murmurs appreciated. Lungs:  Clear to auscultation bilaterally. No wheezes, rales, or rhonchi. No distress.  Abdomen:  +  BS, soft, and non-distended.  Mild TTP in the epigastric area.  No rebound  or guarding. No HSM or masses noted. Msk:  Symmetrical without gross deformities. Normal posture. Extremities:  Without edema. Neurologic:  Alert and  oriented x4 Psych: Normal mood and affect.    Assessment: 60 y.o. male with a history of  ileocolonic Crohn's disease, diagnosed in 1993, recently started on Humira due to recurrent flares on Lialda, C. difficile April 2022 treated with vancomycin, occasional GERD symptoms, and reported solid food dysphagia in May 2022 as well as increasing frequency of central CP and referred to cardiology, presenting today for follow-up.  Ileocolonic Crohn's disease: Much improved on Humira which was started on 6/23.  Ricky Blackwell has been completely off of prednisone for about 3 weeks.  Abdominal pain, diarrhea, and rectal bleeding have stopped.  Stools are now formed which Ricky Blackwell has not experienced for years.  Actually, dealing with mild intermittent constipation, likely influenced by occasional pain pill.  Some abdominal cramping if constipated that improves after a bowel movement. I have recommended trial of MiraLAX for management of constipation.  Last colonoscopy in February 2022 with evidence of active disease in the ileocecal valve, descending colon, and sigmoid colon.  If Ricky Blackwell continues to be in clinical remission, would recommend repeating a colonoscopy in 6-12 months to evaluate for histologic remission.  Recent CBC, BMP 7/15 with normal renal function, CBC within normal limits.  We will need to update HFP as well as a vitamin D level at this time.  We also discussed vaccinations.  Ricky Blackwell will need hepatitis A and B vaccination.  I am requesting his vaccine records from PCP for review.  Further recommendations to follow.   Dysphagia/atypical chest pain: Intermittent solid food dysphagia.  Denies any routine typical GERD symptoms, but Ricky Blackwell does have atypical chest pain.  Ricky Blackwell has been evaluated by cardiology with a normal cardiac cath.  Query whether Ricky Blackwell has atypical GERD which may be  contributing to his dysphagia with possible esophageal web, ring, or stricture.  I have recommended low-dose PPI and EGD for further evaluation.   Plan:  Proceed with EGD +/- dilation with propofol with Dr. Gala Romney in the near future. The risks, benefits, and alternatives have been discussed with the patient in detail. The patient states understanding and desires to proceed. ASA III No morning diabetes medications day of procedure. HFP, vitamin D 25-hydroxy. Start omeprazole 20 mg daily 30 minutes before breakfast. Start MiraLAX 1 capful (17 g) daily in 8 ounces of water.  Requested patient let me know if Ricky Blackwell is not satisfied with bowel movements in 2 to 4 weeks. Continue Humira every 2 weeks. Request vaccination records from PCP for review. Patient will need hepatitis A and B vaccination.  We will send prescription once I confirm if other vaccinations are needed. Follow-up after EGD.    Aliene Altes, PA-C [facility] 11/26/2020

## 2020-11-26 ENCOUNTER — Ambulatory Visit: Payer: Commercial Managed Care - PPO | Admitting: Gastroenterology

## 2020-11-26 ENCOUNTER — Telehealth: Payer: Self-pay

## 2020-11-26 ENCOUNTER — Other Ambulatory Visit: Payer: Self-pay

## 2020-11-26 ENCOUNTER — Encounter: Payer: Self-pay | Admitting: Gastroenterology

## 2020-11-26 ENCOUNTER — Telehealth: Payer: Self-pay | Admitting: Gastroenterology

## 2020-11-26 VITALS — BP 126/65 | HR 82 | Temp 97.1°F | Ht 72.0 in | Wt 318.0 lb

## 2020-11-26 DIAGNOSIS — R0789 Other chest pain: Secondary | ICD-10-CM | POA: Diagnosis not present

## 2020-11-26 DIAGNOSIS — K5080 Crohn's disease of both small and large intestine without complications: Secondary | ICD-10-CM | POA: Diagnosis not present

## 2020-11-26 DIAGNOSIS — R1310 Dysphagia, unspecified: Secondary | ICD-10-CM | POA: Diagnosis not present

## 2020-11-26 DIAGNOSIS — K5900 Constipation, unspecified: Secondary | ICD-10-CM

## 2020-11-26 LAB — HEPATIC FUNCTION PANEL
AG Ratio: 2 (calc) (ref 1.0–2.5)
ALT: 28 U/L (ref 9–46)
AST: 18 U/L (ref 10–35)
Albumin: 4.1 g/dL (ref 3.6–5.1)
Alkaline phosphatase (APISO): 42 U/L (ref 35–144)
Bilirubin, Direct: 0.1 mg/dL (ref 0.0–0.2)
Globulin: 2.1 g/dL (calc) (ref 1.9–3.7)
Indirect Bilirubin: 0.3 mg/dL (calc) (ref 0.2–1.2)
Total Bilirubin: 0.4 mg/dL (ref 0.2–1.2)
Total Protein: 6.2 g/dL (ref 6.1–8.1)

## 2020-11-26 LAB — VITAMIN D 25 HYDROXY (VIT D DEFICIENCY, FRACTURES): Vit D, 25-Hydroxy: 21 ng/mL — ABNORMAL LOW (ref 30–100)

## 2020-11-26 MED ORDER — OMEPRAZOLE 20 MG PO CPDR: 20.0000 mg | DELAYED_RELEASE_CAPSULE | Freq: Every day | ORAL | 3 refills | Status: DC

## 2020-11-26 NOTE — Telephone Encounter (Signed)
Will call pt to schedule EGD/-/+DIL w/Propofol ASA 3 w/Dr. Gala Romney when his next schedule is available.

## 2020-11-26 NOTE — Patient Instructions (Signed)
Please have blood work completed at [facility]  We will arrange for you to have an upper endoscopy with possible stretching of your esophagus in the near future with Dr. Gala Romney.  I suspect your chest pain may be secondary to atypical acid reflux.  Start omeprazole 20 mg daily 30 minutes before breakfast.  For constipation, start MiraLAX 1 capful (17 g) daily in 8 ounces of water. After 2 to 4 weeks, if you are not satisfied with your bowel movements, please let me know.  Continue Humira injection every 2 weeks.  We will be in contact with you regarding the vaccinations you need.  We will plan to see you back in the office after your upper endoscopy.  Do not hesitate to call if you have any questions or concerns prior to next visit!   It was great to see you today!  I am glad you are feeling much better!  Aliene Altes, PA-C [facility]

## 2020-11-26 NOTE — Telephone Encounter (Signed)
Requested from PCP

## 2020-11-26 NOTE — Telephone Encounter (Signed)
Ricky Blackwell, please request vaccination records from PCP.

## 2020-11-29 NOTE — Telephone Encounter (Signed)
Noted  

## 2020-12-02 ENCOUNTER — Other Ambulatory Visit: Payer: Self-pay | Admitting: Gastroenterology

## 2020-12-02 ENCOUNTER — Encounter: Payer: Self-pay | Admitting: Gastroenterology

## 2020-12-02 DIAGNOSIS — Z79899 Other long term (current) drug therapy: Secondary | ICD-10-CM

## 2020-12-02 DIAGNOSIS — K50819 Crohn's disease of both small and large intestine with unspecified complications: Secondary | ICD-10-CM

## 2020-12-07 ENCOUNTER — Telehealth: Payer: Self-pay | Admitting: Internal Medicine

## 2020-12-07 NOTE — Telephone Encounter (Signed)
VM is full. We are waiting to get vaccination records from PCP. Once those are received we can recommend what vaccinations are needed.

## 2020-12-07 NOTE — Telephone Encounter (Signed)
PATIENT CALLED AND SAID THAT HE IS SUPPOSED TO HAVE SOME VACCINATIONS AND HIS PCP WANTED HIM TO TELL THE OFFICE TO FAX THEM THERE TO HIS PCP

## 2020-12-09 NOTE — Telephone Encounter (Signed)
Unable to leave message due to vm being full.

## 2020-12-10 NOTE — Telephone Encounter (Signed)
Called pt, VM full. Unable to leave VM

## 2020-12-11 ENCOUNTER — Telehealth: Payer: Self-pay

## 2020-12-11 NOTE — Telephone Encounter (Signed)
Pre-op appt 02/02/21. Appt letter mailed with procedure instructions.

## 2020-12-11 NOTE — Telephone Encounter (Signed)
Spoke to pt, EGD/-/+DIL w/Propofol w/Dr. Gala Romney scheduled for 02/05/21 at 10:45am. Orders entered.  PA for EGD/DIL submitted via [facility] website. Case pending. Case ID: [id number].

## 2020-12-18 ENCOUNTER — Other Ambulatory Visit: Payer: Self-pay

## 2020-12-18 NOTE — Telephone Encounter (Signed)
No PA required for EGD/DIL.

## 2020-12-23 ENCOUNTER — Telehealth: Payer: Self-pay | Admitting: Gastroenterology

## 2020-12-23 NOTE — Telephone Encounter (Signed)
Received documentation from PCP regarding patient's vaccine record.  This only includes influenza vaccine given in 2018 and 2019, Moderna booster in January 2022, and Tdap in November 2021.  I suspect this is not his complete vaccine record.  Vaccine recommendations for Ricky Blackwell in the setting of Crohn's Disease: 1.  Seasonal non-live influenza vaccine (annually). 2.  Pneumococcal vaccine.  He will need to discuss the specific vaccine with his primary care provider.  Unclear if he has had any prior vaccination.  Additionally, there are 2 new pneumonia vaccines available.  Depending on any prior vaccination, this will determine what additional vaccine is needed. 3.  Hepatitis B vaccine. 4.  Hepatitis A vaccine.  I recommend he discuss receiving these vaccinations with his primary care provider so PCP can arrange for him. Please mail the above recommendations to patient so he can take the list with him when he discusses with PCP.

## 2020-12-24 NOTE — Telephone Encounter (Signed)
Pt was made aware and verbalized understanding. Mailed list to patient.

## 2020-12-24 NOTE — Telephone Encounter (Signed)
VM full, mailed list to patient with instructions to take list to PCP to obtain vaccines.

## 2021-02-02 ENCOUNTER — Encounter (HOSPITAL_COMMUNITY): Payer: Self-pay

## 2021-02-02 ENCOUNTER — Other Ambulatory Visit: Payer: Self-pay

## 2021-02-02 ENCOUNTER — Encounter (HOSPITAL_COMMUNITY)
Admission: RE | Admit: 2021-02-02 | Discharge: 2021-02-02 | Disposition: A | Discharge status: Home / Self Care | Payer: Commercial Managed Care - PPO | Source: Ambulatory Visit | Attending: Internal Medicine | Admitting: Internal Medicine

## 2021-02-02 VITALS — BP 144/85 | HR 69 | Temp 98.4°F | Resp 18 | Ht 72.0 in | Wt 315.0 lb

## 2021-02-02 DIAGNOSIS — Z01812 Encounter for preprocedural laboratory examination: Secondary | ICD-10-CM | POA: Diagnosis not present

## 2021-02-02 DIAGNOSIS — E119 Type 2 diabetes mellitus without complications: Secondary | ICD-10-CM | POA: Insufficient documentation

## 2021-02-02 DIAGNOSIS — K625 Hemorrhage of anus and rectum: Secondary | ICD-10-CM | POA: Diagnosis not present

## 2021-02-02 HISTORY — DX: Type 2 diabetes mellitus without complications: E11.9

## 2021-02-02 HISTORY — DX: Angina pectoris, unspecified: I20.9

## 2021-02-02 LAB — CBC WITH DIFFERENTIAL/PLATELET
Abs Immature Granulocytes: 0.01 10*3/uL (ref 0.00–0.07)
Basophils Absolute: 0 10*3/uL (ref 0.0–0.1)
Basophils Relative: 1 %
Eosinophils Absolute: 0.1 10*3/uL (ref 0.0–0.5)
Eosinophils Relative: 2 %
HCT: 40.7 % (ref 39.0–52.0)
Hemoglobin: 13.9 g/dL (ref 13.0–17.0)
Immature Granulocytes: 0 %
Lymphocytes Relative: 32 %
Lymphs Abs: 2 10*3/uL (ref 0.7–4.0)
MCH: 32.3 pg (ref 26.0–34.0)
MCHC: 34.2 g/dL (ref 30.0–36.0)
MCV: 94.4 fL (ref 80.0–100.0)
Monocytes Absolute: 0.6 10*3/uL (ref 0.1–1.0)
Monocytes Relative: 10 %
Neutro Abs: 3.4 10*3/uL (ref 1.7–7.7)
Neutrophils Relative %: 55 %
Platelets: 236 10*3/uL (ref 150–400)
RBC: 4.31 MIL/uL (ref 4.22–5.81)
RDW: 12.2 % (ref 11.5–15.5)
WBC: 6.1 10*3/uL (ref 4.0–10.5)
nRBC: 0 % (ref 0.0–0.2)

## 2021-02-02 LAB — COMPREHENSIVE METABOLIC PANEL
ALT: 33 U/L (ref 0–44)
AST: 23 U/L (ref 15–41)
Albumin: 4.2 g/dL (ref 3.5–5.0)
Alkaline Phosphatase: 41 U/L (ref 38–126)
Anion gap: 9 (ref 5–15)
BUN: 15 mg/dL (ref 6–20)
CO2: 27 mmol/L (ref 22–32)
Calcium: 9.2 mg/dL (ref 8.9–10.3)
Chloride: 104 mmol/L (ref 98–111)
Creatinine, Ser: 0.75 mg/dL (ref 0.61–1.24)
GFR, Estimated: >60 mL/min (ref >60)
Glucose, Bld: 101 mg/dL — ABNORMAL HIGH (ref 70–99)
Potassium: 3.5 mmol/L (ref 3.5–5.1)
Sodium: 140 mmol/L (ref 135–145)
Total Bilirubin: 0.6 mg/dL (ref 0.3–1.2)
Total Protein: 7.2 g/dL (ref 6.5–8.1)

## 2021-02-02 NOTE — Patient Instructions (Signed)
Ricky Blackwell  02/02/2021     @PREFPERIOPPHARMACY @   Your procedure is scheduled on 02/05/2021.   Report to [facility] at 0900 A.M.   Call this number if you have problems the morning of surgery:  [phone]   Remember:  Follow the diet instructions given to you by the office.    Take these medicines the morning of surgery with A SIP OF WATER           amlodipine, flexeril(if needed), oxycodone (if needed). Flomax.     Do not wear jewelry, make-up or nail polish.  Do not wear lotions, powders, or perfumes, or deodorant.  Do not shave 48 hours prior to surgery.  Men may shave face and neck.  Do not bring valuables to the hospital.  [facility] is not responsible for any belongings or valuables.  Contacts, dentures or bridgework may not be worn into surgery.  Leave your suitcase in the car.  After surgery it may be brought to your room.  For patients admitted to the hospital, discharge time will be determined by your treatment team.  Patients discharged the day of surgery will not be allowed to drive home and must have someone with them for 24 hours.    Special instructions:   DO NOT smoke tobacco or vape for 24 hours before your procedure.  Please read over the following fact sheets that you were given. Anesthesia Post-op Instructions and Care and Recovery After Surgery      Upper Endoscopy, Adult, Care After This sheet gives you information about how to care for yourself after your procedure. Your health care provider may also give you more specific instructions. If you have problems or questions, contact your health care provider. What can I expect after the procedure? After the procedure, it is common to have: A sore throat. Mild stomach pain or discomfort. Bloating. Nausea. Follow these instructions at home:  Follow instructions from your health care provider about what to eat or drink after your procedure. Return to your normal activities as  told by your health care provider. Ask your health care provider what activities are safe for you. Take over-the-counter and prescription medicines only as told by your health care provider. If you were given a sedative during the procedure, it can affect you for several hours. Do not drive or operate machinery until your health care provider says that it is safe. Keep all follow-up visits as told by your health care provider. This is important. Contact a health care provider if you have: A sore throat that lasts longer than one day. Trouble swallowing. Get help right away if: You vomit blood or your vomit looks like coffee grounds. You have: A fever. Bloody, black, or tarry stools. A severe sore throat or you cannot swallow. Difficulty breathing. Severe pain in your chest or abdomen. Summary After the procedure, it is common to have a sore throat, mild stomach discomfort, bloating, and nausea. If you were given a sedative during the procedure, it can affect you for several hours. Do not drive or operate machinery until your health care provider says that it is safe. Follow instructions from your health care provider about what to eat or drink after your procedure. Return to your normal activities as told by your health care provider. This information is not intended to replace advice given to you by your health care provider. Make sure you discuss any questions you have with your  health care provider. Document Revised: 03/26/2019 Document Reviewed: 08/28/2017 Elsevier Patient Education  2022 [place]. Esophageal Dilatation Esophageal dilatation, also called esophageal dilation, is a procedure to widen or open a blocked or narrowed part of the esophagus. The esophagus is the part of the body that moves food and liquid from the mouth to the stomach. You may need this procedure if: You have a buildup of scar tissue in your esophagus that makes it difficult, painful, or impossible to  swallow. This can be caused by gastroesophageal reflux disease (GERD). You have cancer of the esophagus. There is a problem with how food moves through your esophagus. In some cases, you may need this procedure repeated at a later time to dilate the esophagus gradually. Tell a health care provider about: Any allergies you have. All medicines you are taking, including vitamins, herbs, eye drops, creams, and over-the-counter medicines. Any problems you or family members have had with anesthetic medicines. Any blood disorders you have. Any surgeries you have had. Any medical conditions you have. Any antibiotic medicines you are required to take before dental procedures. Whether you are pregnant or may be pregnant. What are the risks? Generally, this is a safe procedure. However, problems may occur, including: Bleeding due to a tear in the lining of the esophagus. A hole, or perforation, in the esophagus. What happens before the procedure? Ask your health care provider about: Changing or stopping your regular medicines. This is especially important if you are taking diabetes medicines or blood thinners. Taking medicines such as aspirin and ibuprofen. These medicines can thin your blood. Do not take these medicines unless your health care provider tells you to take them. Taking over-the-counter medicines, vitamins, herbs, and supplements. Follow instructions from your health care provider about eating or drinking restrictions. Plan to have a responsible adult take you home from the hospital or clinic. Plan to have a responsible adult care for you for the time you are told after you leave the hospital or clinic. This is important. What happens during the procedure? You may be given a medicine to help you relax (sedative). A numbing medicine may be sprayed into the back of your throat, or you may gargle the medicine. Your health care provider may perform the dilatation using various surgical  instruments, such as: Simple dilators. This instrument is carefully placed in the esophagus to stretch it. Guided wire bougies. This involves using an endoscope to insert a wire into the esophagus. A dilator is passed over this wire to enlarge the esophagus. Then the wire is removed. Balloon dilators. An endoscope with a small balloon is inserted into the esophagus. The balloon is inflated to stretch the esophagus and open it up. The procedure may vary among health care providers and hospitals. What can I expect after the procedure? Your blood pressure, heart rate, breathing rate, and blood oxygen level will be monitored until you leave the hospital or clinic. Your throat may feel slightly sore and numb. This will get better over time. You will not be allowed to eat or drink until your throat is no longer numb. When you are able to drink, urinate, and sit on the edge of the bed without nausea or dizziness, you may be able to return home. Follow these instructions at home: Take over-the-counter and prescription medicines only as told by your health care provider. If you were given a sedative during the procedure, it can affect you for several hours. Do not drive or operate machinery until your  health care provider says that it is safe. Plan to have a responsible adult care for you for the time you are told. This is important. Follow instructions from your health care provider about any eating or drinking restrictions. Do not use any products that contain nicotine or tobacco, such as cigarettes, e-cigarettes, and chewing tobacco. If you need help quitting, ask your health care provider. Keep all follow-up visits. This is important. Contact a health care provider if: You have a fever. You have pain that is not relieved by medicine. Get help right away if: You have chest pain. You have trouble breathing. You have trouble swallowing. You vomit blood. You have black, tarry, or bloody  stools. These symptoms may represent a serious problem that is an emergency. Do not wait to see if the symptoms will go away. Get medical help right away. Call your local emergency services (911 in the [place] Do not drive yourself to the hospital. Summary Esophageal dilatation, also called esophageal dilation, is a procedure to widen or open a blocked or narrowed part of the esophagus. Plan to have a responsible adult take you home from the hospital or clinic. For this procedure, a numbing medicine may be sprayed into the back of your throat, or you may gargle the medicine. Do not drive or operate machinery until your health care provider says that it is safe. This information is not intended to replace advice given to you by your health care provider. Make sure you discuss any questions you have with your health care provider. Document Revised: 08/14/2019 Document Reviewed: 08/14/2019 Elsevier Patient Education  [place] After This sheet gives you information about how to care for yourself after your procedure. Your health care provider may also give you more specific instructions. If you have problems or questions, contact your health care provider. What can I expect after the procedure? After the procedure, it is common to have: Tiredness. Forgetfulness about what happened after the procedure. Impaired judgment for important decisions. Nausea or vomiting. Some difficulty with balance. Follow these instructions at home: For the time period you were told by your health care provider:   Rest as needed. Do not participate in activities where you could fall or become injured. Do not drive or use machinery. Do not drink alcohol. Do not take sleeping pills or medicines that cause drowsiness. Do not make important decisions or sign legal documents. Do not take care of children on your own. Eating and drinking Follow the diet that is recommended by  your health care provider. Drink enough fluid to keep your urine pale yellow. If you vomit: Drink water, juice, or soup when you can drink without vomiting. Make sure you have little or no nausea before eating solid foods. General instructions Have a responsible adult stay with you for the time you are told. It is important to have someone help care for you until you are awake and alert. Take over-the-counter and prescription medicines only as told by your health care provider. If you have sleep apnea, surgery and certain medicines can increase your risk for breathing problems. Follow instructions from your health care provider about wearing your sleep device: Anytime you are sleeping, including during daytime naps. While taking prescription pain medicines, sleeping medicines, or medicines that make you drowsy. Avoid smoking. Keep all follow-up visits as told by your health care provider. This is important. Contact a health care provider if: You keep feeling nauseous or you keep vomiting. You  feel light-headed. You are still sleepy or having trouble with balance after 24 hours. You develop a rash. You have a fever. You have redness or swelling around the IV site. Get help right away if: You have trouble breathing. You have new-onset confusion at home. Summary For several hours after your procedure, you may feel tired. You may also be forgetful and have poor judgment. Have a responsible adult stay with you for the time you are told. It is important to have someone help care for you until you are awake and alert. Rest as told. Do not drive or operate machinery. Do not drink alcohol or take sleeping pills. Get help right away if you have trouble breathing, or if you suddenly become confused. This information is not intended to replace advice given to you by your health care provider. Make sure you discuss any questions you have with your health care provider. Document Revised: 12/12/2019  Document Reviewed: 02/28/2019 Elsevier Patient Education  2022 [employer].

## 2021-02-05 ENCOUNTER — Ambulatory Visit (HOSPITAL_COMMUNITY): Payer: Commercial Managed Care - PPO | Admitting: Anesthesiology

## 2021-02-05 ENCOUNTER — Encounter (HOSPITAL_COMMUNITY)
Admission: RE | Disposition: A | Discharge status: Home / Self Care | Payer: Self-pay | Source: Ambulatory Visit | Attending: Internal Medicine

## 2021-02-05 ENCOUNTER — Ambulatory Visit (HOSPITAL_COMMUNITY)
Admission: RE | Admit: 2021-02-05 | Discharge: 2021-02-05 | Disposition: A | Discharge status: Home / Self Care | Payer: Commercial Managed Care - PPO | Source: Ambulatory Visit | Attending: Internal Medicine | Admitting: Internal Medicine

## 2021-02-05 DIAGNOSIS — Z7984 Long term (current) use of oral hypoglycemic drugs: Secondary | ICD-10-CM | POA: Diagnosis not present

## 2021-02-05 DIAGNOSIS — K5090 Crohn's disease, unspecified, without complications: Secondary | ICD-10-CM | POA: Insufficient documentation

## 2021-02-05 DIAGNOSIS — R1314 Dysphagia, pharyngoesophageal phase: Secondary | ICD-10-CM | POA: Insufficient documentation

## 2021-02-05 DIAGNOSIS — Z87891 Personal history of nicotine dependence: Secondary | ICD-10-CM | POA: Insufficient documentation

## 2021-02-05 DIAGNOSIS — R1310 Dysphagia, unspecified: Secondary | ICD-10-CM | POA: Diagnosis not present

## 2021-02-05 DIAGNOSIS — Z79899 Other long term (current) drug therapy: Secondary | ICD-10-CM | POA: Insufficient documentation

## 2021-02-05 DIAGNOSIS — Z885 Allergy status to narcotic agent status: Secondary | ICD-10-CM | POA: Insufficient documentation

## 2021-02-05 DIAGNOSIS — E119 Type 2 diabetes mellitus without complications: Secondary | ICD-10-CM | POA: Diagnosis not present

## 2021-02-05 DIAGNOSIS — Z7982 Long term (current) use of aspirin: Secondary | ICD-10-CM | POA: Diagnosis not present

## 2021-02-05 DIAGNOSIS — Z8616 Personal history of COVID-19: Secondary | ICD-10-CM | POA: Insufficient documentation

## 2021-02-05 HISTORY — PX: MALONEY DILATION: SHX5535

## 2021-02-05 HISTORY — PX: ESOPHAGOGASTRODUODENOSCOPY (EGD) WITH PROPOFOL: SHX5813

## 2021-02-05 LAB — GLUCOSE, CAPILLARY: Glucose-Capillary: 129 mg/dL — ABNORMAL HIGH (ref 70–99)

## 2021-02-05 SURGERY — ESOPHAGOGASTRODUODENOSCOPY (EGD) WITH PROPOFOL
Anesthesia: Monitor Anesthesia Care

## 2021-02-05 MED ORDER — MIDAZOLAM HCL 2 MG/2ML IJ SOLN
INTRAMUSCULAR | Status: DC | PRN
  Administered 2021-02-05: 2 mg via INTRAVENOUS

## 2021-02-05 MED ORDER — LIDOCAINE HCL (PF) 2 % IJ SOLN
INTRAMUSCULAR | Status: AC
  Filled 2021-02-05: qty 5

## 2021-02-05 MED ORDER — PROPOFOL 500 MG/50ML IV EMUL
INTRAVENOUS | Status: DC | PRN
  Administered 2021-02-05: 200 ug/kg/min via INTRAVENOUS

## 2021-02-05 MED ORDER — PROPOFOL 10 MG/ML IV BOLUS
INTRAVENOUS | Status: DC | PRN
  Administered 2021-02-05: 100 mg via INTRAVENOUS

## 2021-02-05 MED ORDER — LACTATED RINGERS IV SOLN: INTRAVENOUS | Status: DC

## 2021-02-05 MED ORDER — MIDAZOLAM HCL 2 MG/2ML IJ SOLN
INTRAMUSCULAR | Status: AC
  Filled 2021-02-05: qty 2

## 2021-02-05 NOTE — H&P (Signed)
@LOGO @   Primary Care Physician:  Adaline Sill, NP Primary Gastroenterologist:  Dr. Gala Romney  Pre-Procedure History & Physical: HPI:  Ricky Blackwell is a 60 y.o. male here for Further evaluation of esophageal dysphagia.   Some chest pain not felt to be cardiac in origin with recent negative cardiac work-up..  Crohn's doing excellent on Humira.  Past Medical History:  Diagnosis Date   Anal stenosis    Anginal pain ([place])    Arthritis    Colitis    COVID-19 04/2020   Crohn's disease ([place]) 1993   Diabetes mellitus without complication ([place])    H/O hiatal hernia    History of kidney stones    Hypertension    Irritable bowel syndrome    Pneumonia    hx   Rectal bleeding    Unspecified constipation     Past Surgical History:  Procedure Laterality Date   ABDOMINAL HERNIA REPAIR  2010   BACK SURGERY  2000   BIOPSY  05/21/2020   Procedure: BIOPSY;  Surgeon: Daneil Dolin, MD;  [facility];  Service: Endoscopy;;   CARDIAC CATHETERIZATION     COLONOSCOPY  05/2004   [facility]- crohns    COLONOSCOPY  08/20/2008   Dr. Gala Romney- normal distal rectum, proximal rectum, scattered 1-2 mm aphthous ulcers, these extended into the distal sigmoid.   COLONOSCOPY N/A 10/03/2014   Sabra Sessler: findings c/w mildly active ileocolonic crohn's with scattered aphthous ulcers in the distal sigmoid colon, descending colon, ascending colon, terminal ileum. Transverse colon was normal.   COLONOSCOPY WITH PROPOFOL N/A 05/25/2017   Procedure: COLONOSCOPY WITH PROPOFOL;  Surgeon: Daneil Dolin, MD;  scattered ulcers and erythema involving the rectum colon and terminal ileum consistent with Crohn's disease, one benign 4 mm sessile polyp, s/p frequent segmental biopsies.  Pathology with chronic mildly active ileitis and mildly active colitis in ascending colon, sigmoid colon, and rectum.  Repeat in 2 years.   COLONOSCOPY WITH PROPOFOL N/A 05/21/2020    Surgeon: Daneil Dolin, MD;  IBD with  skipped involvement of the colon endoscopically consistent with Crohn's colitis s/p segmental biopsy.  Normal-appearing distal TI, ulcerated ileocecal valve.  Pathology with mildly active chronic colitis in the ileocecal valve, moderately active chronic colitis in the descending colon, severely active chronic colitis with ulceration in the sigmoid colon.    JOINT REPLACEMENT Left    knee   KNEE SURGERY Right 2010   Left Arthoscopic   LEFT HEART CATH AND CORONARY ANGIOGRAPHY N/A 10/27/2020   Procedure: LEFT HEART CATH AND CORONARY ANGIOGRAPHY;  Surgeon: Leonie Man, MD;  Location: [facility];  Service: Cardiovascular;  Laterality: N/A;   NECK SURGERY     NECK SURGERY N/A 2005   metal plate in neck   POLYPECTOMY  05/25/2017   Procedure: POLYPECTOMY;  Surgeon: Daneil Dolin, MD;  Location: [facility];  Service: Endoscopy;;  Sigmoid colon (CS)   TOTAL KNEE ARTHROPLASTY Left 02/22/2013   Procedure: LEFT TOTAL KNEE ARTHROPLASTY;  Surgeon: Yvette Rack., MD;  Location: [facility];  Service: Orthopedics;  Laterality: Left;    Prior to Admission medications   Medication Sig Start Date End Date Taking? Authorizing Provider  Adalimumab (HUMIRA PEN) 40 MG/0.4ML PNKT Inject 40 mg into the skin every 14 (fourteen) days. Wednesdays   Yes [provider]  amLODipine (NORVASC) 2.5 MG tablet Take 1 tablet (2.5 mg total) by mouth daily. 10/27/20 10/27/21 Yes Leonie Man,  MD  aspirin EC 81 MG tablet Take 81 mg by mouth daily. Swallow whole.   Yes [provider]  cholecalciferol (VITAMIN D3) 25 MCG (1000 UNIT) tablet Take 1,000 Units by mouth daily.   Yes [provider]  cyclobenzaprine (FLEXERIL) 10 MG tablet Take 10 mg by mouth 3 (three) times daily as needed for muscle spasms.   Yes [provider]  hydrochlorothiazide (MICROZIDE) 12.5 MG capsule Take 12.5 mg by mouth daily.   Yes [provider]  lisinopril (PRINIVIL,ZESTRIL) 10 MG tablet Take  10 mg by mouth daily.  03/23/17  Yes [provider]  metFORMIN (GLUCOPHAGE) 500 MG tablet Take 500 mg by mouth daily with supper.   Yes [provider]  oxyCODONE-acetaminophen (PERCOCET) 10-325 MG tablet Take 1 tablet by mouth every 6 (six) hours as needed for pain.   Yes [provider]  tamsulosin (FLOMAX) 0.4 MG CAPS capsule Take 0.4 mg by mouth daily after breakfast. 03/28/17  Yes [provider]  nitroGLYCERIN (NITROSTAT) 0.4 MG SL tablet Place 1 tablet (0.4 mg total) under the tongue every 5 (five) minutes as needed for chest pain. 10/14/20 01/12/21  Josue Hector, MD    Allergies as of 12/11/2020 - Review Complete 11/26/2020  Allergen Reaction Noted   Toradol [ketorolac tromethamine]  02/25/2020    Family History  Problem Relation Age of Onset   Diabetes Mother    Heart failure Mother    Heart failure Father    Colon polyps Father    Cancer - Lung Father    COPD Sister    Diabetes type II Sister    Hypertension Sister    Sleep apnea Sister    Arthritis Sister    Colitis Sister        Maybe?   Irregular heart beat Brother    Alcoholism Brother    Arthritis Brother    Colon cancer Neg Hx    Inflammatory bowel disease Neg Hx     Social History   Socioeconomic History   Marital status: Married    Spouse name: Not on file   Number of children: Not on file   Years of education: Not on file   Highest education level: Not on file  Occupational History   Not on file  Tobacco Use   Smoking status: Former    Packs/day: 2.00    Years: 10.00    Pack years: 20.00    Types: Cigarettes    Quit date: 02/13/1987    Years since quitting: 34.0   Smokeless tobacco: Never  Vaping Use   Vaping Use: Never used  Substance and Sexual Activity   Alcohol use: No   Drug use: No   Sexual activity: Yes    Birth control/protection: None  Other Topics Concern   Not on file  Social History Narrative   Not on file   Social Determinants of Health    Financial Resource Strain: Not on file  Food Insecurity: Not on file  Transportation Needs: Not on file  Physical Activity: Not on file  Stress: Not on file  Social Connections: Not on file  Intimate Partner Violence: Not on file    Review of Systems: See HPI, otherwise negative ROS  Physical Exam: BP 130/68   Pulse 64   Temp 98 F (36.7 C) (Oral)   Resp 18   SpO2 97%  General:   Alert,  Well-developed, well-nourished, pleasant and cooperative in NAD Neck:  Supple; no masses or thyromegaly.  No significant cervical adenopathy. Lungs:  Clear throughout to auscultation.   No wheezes, crackles, or rhonchi. No acute distress. Heart:  Regular rate and rhythm; no murmurs, clicks, rubs,  or gallops. Abdomen: Non-distended, normal bowel sounds.  Soft and nontender without appreciable mass or hepatosplenomegaly.  Pulses:  Normal pulses noted. Extremities:  Without clubbing or edema.  Impression/Plan:    59 year old gentleman with longstanding GERD now with esophageal dysphagia.  Here for EGD with possible esophageal dilation as feasible/appropriate per plan. The risks, benefits, limitations, alternatives and imponderables have been reviewed with the patient. Potential for esophageal dilation, biopsy, etc. have also been reviewed.  Questions have been answered. All parties agreeable.      Notice: This dictation was prepared with Dragon dictation along with smaller phrase technology. Any transcriptional errors that result from this process are unintentional and may not be corrected upon review.

## 2021-02-05 NOTE — Transfer of Care (Signed)
Immediate Anesthesia Transfer of Care Note  Patient: Ricky Blackwell  Procedure(s) Performed: ESOPHAGOGASTRODUODENOSCOPY (EGD) WITH PROPOFOL [place]  Patient Location: PACU  Anesthesia Type:General  Level of Consciousness: awake, alert , oriented and patient cooperative  Airway & Oxygen Therapy: Patient Spontanous Breathing  Post-op Assessment: Report given to RN, Post -op Vital signs reviewed and stable and Patient moving all extremities X 4  Post vital signs: Reviewed and stable  Last Vitals:  Vitals Value Taken Time  BP 101/57 02/05/21 1005  Temp 36.7 C 02/05/21 1005  Pulse 67 02/05/21 1005  Resp 14 02/05/21 1005  SpO2 95 % 02/05/21 1005    Last Pain:  Vitals:   02/05/21 1011  TempSrc:   PainSc: 0-No pain      Patients Stated Pain Goal: 5 (60/47/99 8721)  Complications: No notable events documented.

## 2021-02-05 NOTE — Anesthesia Preprocedure Evaluation (Addendum)
Anesthesia Evaluation  Patient identified by MRN, date of birth, ID band Patient awake    Reviewed: Allergy & Precautions, NPO status , Patient's Chart, lab work & pertinent test results  Airway Mallampati: II  TM Distance: >3 FB Neck ROM: Full    Dental  (+) Dental Advisory Given, Missing   Pulmonary pneumonia, former smoker,    Pulmonary exam normal breath sounds clear to auscultation       Cardiovascular hypertension, Pt. on medications + angina Normal cardiovascular exam Rhythm:Regular Rate:Normal     Neuro/Psych negative neurological ROS  negative psych ROS   GI/Hepatic Neg liver ROS, hiatal hernia, GERD  ,  Endo/Other  diabetes, Well Controlled, Type 2, Oral Hypoglycemic AgentsMorbid obesity  Renal/GU negative Renal ROS     Musculoskeletal  (+) Arthritis , Osteoarthritis,    Abdominal   Peds  Hematology   Anesthesia Other Findings   Reproductive/Obstetrics                            Anesthesia Physical Anesthesia Plan  ASA: 3  Anesthesia Plan:    Post-op Pain Management:    Induction:   PONV Risk Score and Plan: TIVA  Airway Management Planned: Nasal Cannula and Natural Airway  Additional Equipment:   Intra-op Plan:   Post-operative Plan:   Informed Consent: I have reviewed the patients History and Physical, chart, labs and discussed the procedure including the risks, benefits and alternatives for the proposed anesthesia with the patient or authorized representative who has indicated his/her understanding and acceptance.     Dental advisory given  Plan Discussed with: CRNA and Surgeon  Anesthesia Plan Comments:         Anesthesia Quick Evaluation

## 2021-02-05 NOTE — Discharge Instructions (Signed)
EGD Discharge instructions Please read the instructions outlined below and refer to this sheet in the next few weeks. These discharge instructions provide you with general information on caring for yourself after you leave the hospital. Your doctor may also give you specific instructions. While your treatment has been planned according to the most current medical practices available, unavoidable complications occasionally occur. If you have any problems or questions after discharge, please call your doctor. ACTIVITY You may resume your regular activity but move at a slower pace for the next 24 hours.  Take frequent rest periods for the next 24 hours.  Walking will help expel (get rid of) the air and reduce the bloated feeling in your abdomen.  No driving for 24 hours (because of the anesthesia (medicine) used during the test).  You may shower.  Do not sign any important legal documents or operate any machinery for 24 hours (because of the anesthesia used during the test).  NUTRITION Drink plenty of fluids.  You may resume your normal diet.  Begin with a light meal and progress to your normal diet.  Avoid alcoholic beverages for 24 hours or as instructed by your caregiver.  MEDICATIONS You may resume your normal medications unless your caregiver tells you otherwise.  WHAT YOU CAN EXPECT TODAY You may experience abdominal discomfort such as a feeling of fullness or "gas" pains.  FOLLOW-UP Your doctor will discuss the results of your test with you.  SEEK IMMEDIATE MEDICAL ATTENTION IF ANY OF THE FOLLOWING OCCUR: Excessive nausea (feeling sick to your stomach) and/or vomiting.  Severe abdominal pain and distention (swelling).  Trouble swallowing.  Temperature over 101 F (37.8 C).  Rectal bleeding or vomiting of blood.    Your upper GI tract appeared normal today.  Your esophagus was   Stretched.  Increase omeprazole to 40 mg daily-prescription provided  Office visit with [place] in 3 months  Aliene Altes)   at patient request, I called wife at [phone]- -line repeatedly busy

## 2021-02-05 NOTE — Progress Notes (Signed)
  February 05, 2021  Patient: Ricky Blackwell  Date of Birth: Nov 16, 1960  Date of Visit: 02/05/2021    To Whom It May Concern:  [name] was seen and treated in our Short stay surgical department on 02/05/2021.  Ricky Blackwell   May return to work on 02/08/2021  Sincerely,  Richarda Osmond, RN

## 2021-02-05 NOTE — Anesthesia Postprocedure Evaluation (Signed)
Anesthesia Post Note  Patient: Ricky Blackwell  Procedure(s) Performed: ESOPHAGOGASTRODUODENOSCOPY (EGD) WITH PROPOFOL [place]  Patient location during evaluation: Phase II Anesthesia Type: General Level of consciousness: awake and alert and oriented Pain management: pain level controlled Vital Signs Assessment: post-procedure vital signs reviewed and stable Respiratory status: spontaneous breathing, nonlabored ventilation and respiratory function stable Cardiovascular status: blood pressure returned to baseline and stable Postop Assessment: no apparent nausea or vomiting Anesthetic complications: no   No notable events documented.   Last Vitals:  Vitals:   02/05/21 0854 02/05/21 1005  BP: 130/68 (!) 101/57  Pulse: 64 67  Resp: 18 14  Temp: 36.7 C 36.7 C  SpO2: 97% 95%    Last Pain:  Vitals:   02/05/21 1011  TempSrc:   PainSc: 0-No pain                 Zadrian Mccauley C Vitoria Conyer

## 2021-02-05 NOTE — Op Note (Signed)
[facility] Patient Name: Ricky Blackwell Procedure Date: 02/05/2021 9:33 AM MRN: [id number] Date of Birth: 11/09/1960 Attending MD: Norvel Richards , MD CSN: [id number] Age: 60 Admit Type: Outpatient Procedure:                Upper GI endoscopy Indications:              Dysphagia Providers:                Norvel Richards, MD, Lurline Del, RN, Thomas Hoff., Technician Referring MD:              Medicines:                Propofol per Anesthesia Complications:            No immediate complications. Estimated Blood Loss:     Estimated blood loss: none. Estimated blood loss:                            none. Procedure:                Pre-Anesthesia Assessment:                           - Prior to the procedure, a History and Physical                            was performed, and patient medications and                            allergies were reviewed. The patient's tolerance of                            previous anesthesia was also reviewed. The risks                            and benefits of the procedure and the sedation                            options and risks were discussed with the patient.                            All questions were answered, and informed consent                            was obtained. Prior Anticoagulants: The patient has                            taken no previous anticoagulant or antiplatelet                            agents. ASA Grade Assessment: III - A patient with                            severe  systemic disease. After reviewing the risks                            and benefits, the patient was deemed in                            satisfactory condition to undergo the procedure.                           After obtaining informed consent, the endoscope was                            passed under direct vision. Throughout the                            procedure, the patient's blood pressure, pulse,  and                            oxygen saturations were monitored continuously. The                            [id number]-H190 ([id number]) scope was introduced through the                            mouth, and advanced to the second part of duodenum.                            The upper GI endoscopy was accomplished without                            difficulty. The patient tolerated the procedure                            well. Scope In: 9:56:28 AM Scope Out: 10:01:05 AM Total Procedure Duration: 0 hours 4 minutes 37 seconds  Findings:      The examined esophagus was normal.      The entire examined stomach was normal.      The duodenal bulb and second portion of the duodenum were normal. The       scope was withdrawn. Dilation was performed with a Maloney dilator with       no resistance at 56 Fr. The scope was withdrawn. Dilation was performed       with a Maloney dilator with mild resistance at 44 Fr. The dilation site       was examined following endoscope reinsertion and showed no change.       Estimated blood loss: none. Impression:               - Normal esophagus. Dilated.                           - Normal stomach.                           - Normal duodenal bulb and second portion of the  duodenum.                           - No specimens collected. Moderate Sedation:      Moderate (conscious) sedation was personally administered by an       anesthesia professional. The following parameters were monitored: oxygen       saturation, heart rate, blood pressure, respiratory rate, EKG, adequacy       of pulmonary ventilation, and response to care. Recommendation:           - Patient has a contact number available for                            emergencies. The signs and symptoms of potential                            delayed complications were discussed with the                            patient. Return to normal activities tomorrow.                             Written discharge instructions were provided to the                            patient.                           - Resume previous diet.                           - Continue present medications but increase                            omeprazole to 40 mg daily?"new prescription                            provided..                           - Return to my office in 3 months. Procedure Code(s):        --- Professional ---                           [phone], Esophagogastroduodenoscopy, flexible,                            transoral; diagnostic, including collection of                            specimen(s) by brushing or washing, when performed                            (separate procedure)                           [id number], Dilation of esophagus, by unguided sound or  bougie, single or multiple passes Diagnosis Code(s):        --- Professional ---                           R13.10, Dysphagia, unspecified CPT copyright 2019 American Medical Association. All rights reserved. The codes documented in this report are preliminary and upon coder review may  be revised to meet current compliance requirements. Cristopher Estimable. Tyrie Porzio, MD Norvel Richards, MD 02/05/2021 10:14:48 AM This report has been signed electronically. Number of Addenda: 0

## 2021-02-09 ENCOUNTER — Encounter (HOSPITAL_COMMUNITY): Payer: Self-pay | Admitting: Internal Medicine

## 2021-05-21 ENCOUNTER — Telehealth: Payer: Self-pay | Admitting: *Deleted

## 2021-05-21 NOTE — Telephone Encounter (Signed)
Received call from Gav from [facility].  He informed me that they have RX on file for Humira.  They were following up to see if it needed to be filled.  They had spoke to pt on 04/22/2021 and he informed them he was receiving Humira from another pharmacy ([facility]).    Tammy and I called [facility] and representative informed us that pt has RX good until 10/19/2021.    Called [facility] back and informed Sharee Pimple to cancel RX and informed her of information listed above.  Called pt as well and informed him to continue his RX from [facility] since it is good until 10/19/2021.  Pt voiced understanding.

## 2021-06-11 ENCOUNTER — Telehealth: Payer: Self-pay | Admitting: *Deleted

## 2021-06-11 NOTE — Telephone Encounter (Signed)
I already phoned the Blackwell before seeing it is Ricky Blackwell but the Blackwell has a white scaly lookig bumps like rash on his arms, stomach, back X 3 weeks which is getting worse. He stated it only starting itching X past week. Itches so bad he cannot sleep. Seen PCP was advised of a benadryl type cream which isn't helping. Did not mention to me that it could be from Humira but mentioned it to [name]. I see where [name] sent to [facility] as well. ?

## 2021-06-11 NOTE — Telephone Encounter (Signed)
Spoke with patient.  Reports 3 weeks ago, he started to break out in a rash.  Describes it as a penny size red circles that are white, scaly, flaky on top.  Has a lot of itching.  Called PCP and was prescribed loratadine 10 mg and Benadryl on 2/20.  This is not helping.  No history of psoriasis.  No changes in laundry detergent.  No perioral edema or shortness of breath.  Last dose of Humira was Wednesday. ? 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  Recommended office visit for further evaluation.  Dr. Gala Romney has openings on Tuesday morning.  Patient will like to be seen at 8:00 AM.  Advised he could use some over-the-counter emollients to see if this would help some of his itching. ? ?Stacey:  ?Please schedule patient to see Dr. Gala Romney on Tuesday, 3/7 at 8 AM. ? ?Routing to Maynard as well who may be able to go ahead and get patient scheduled. ?

## 2021-06-11 NOTE — Telephone Encounter (Signed)
Patient called in. Developed rash x 3 weeks now. On humira x few months now. Itching getting rush. PCP advised to take benadryl. Taking x 1 week but not helping. Call him at [phone] ?

## 2021-06-14 NOTE — Telephone Encounter (Signed)
Noted. Thanks.

## 2021-06-15 ENCOUNTER — Ambulatory Visit: Payer: Commercial Managed Care - PPO | Admitting: Internal Medicine

## 2021-06-15 ENCOUNTER — Other Ambulatory Visit: Payer: Self-pay

## 2021-06-15 ENCOUNTER — Encounter: Payer: Self-pay | Admitting: Internal Medicine

## 2021-06-15 VITALS — BP 148/80 | HR 77 | Temp 97.3°F | Ht 72.0 in | Wt 330.6 lb

## 2021-06-15 DIAGNOSIS — K50811 Crohn's disease of both small and large intestine with rectal bleeding: Secondary | ICD-10-CM | POA: Diagnosis not present

## 2021-06-15 DIAGNOSIS — K5080 Crohn's disease of both small and large intestine without complications: Secondary | ICD-10-CM

## 2021-06-15 DIAGNOSIS — Z79899 Other long term (current) drug therapy: Secondary | ICD-10-CM

## 2021-06-15 NOTE — Patient Instructions (Signed)
Humira trough and antibody level ? ?Serum CRP and fecal calprotectin ? ?No change in your medical regimen for the time being. ? ?I am uncertain whether or not your rash has anything to do with your therapy for inflammatory bowel disease. ? ?As discussed, we will send you to Dr. Barnabas Lister call the local dermatologist.  To get his opinion as to the cause of the rash. ? ?Further recommendations to follow in the near future. ?

## 2021-06-15 NOTE — Progress Notes (Signed)
Primary Care Physician:  Adaline Sill, NP Primary Gastroenterologist:  Dr. Gala Romney  Pre-Procedure History & Physical: HPI:  Ricky Blackwell is a 61 y.o. male here for follow-up of inflammatory bowel disease/Crohn's ileocolitis with concerns of a rash.  Patient has been on Humira for about 6 months and is doing great from a GI standpoint.  Normal bowel function -  no urgency, no rectal bleeding, no abdominal pain.  He is gaining weight (and is significantly obese). 1 month ago, he developed a scaly rash on his arms and lower back.  Severely pruritic.  Prescribed loratadine and Benadryl by PCP last week.  No history of psoriasis.  Only skin issues previously have been some itching and redness in his intertriginous areas-groin and axilla.  He is never seen a dermatologist. He has had 2 doses of Humira since rash started.  Significant degenerative joint disease -left knee replacement previously.  Significant DJD right knee for which he support appliance.  Denies joint issues elsewhere.  Although, he has a history of well-documented ileocolonic Crohn's disease, colonoscopy 1 year ago demonstrated inflammatory changes of the rectum and spotty distribution throughout the colon to the ileocecal valve.  Histologically, rectum appeared normal.  Significant sigmoid colitis and mild colitis at the ileocecal valve.  The terminal ileum appeared endoscopically normal (on Lialda at that time).         Past Medical History:  Diagnosis Date   Anal stenosis    Anginal pain ([place])    Arthritis    Colitis    COVID-19 04/2020   Crohn's disease ([place]) 1993   Diabetes mellitus without complication ([place])    H/O hiatal hernia    History of kidney stones    Hypertension    Irritable bowel syndrome    Pneumonia    hx   Rectal bleeding    Unspecified constipation     Past Surgical History:  Procedure Laterality Date   ABDOMINAL HERNIA REPAIR  2010   BACK SURGERY  2000   BIOPSY  05/21/2020    Procedure: BIOPSY;  Surgeon: Daneil Dolin, MD;  [facility];  Service: Endoscopy;;   CARDIAC CATHETERIZATION     COLONOSCOPY  05/2004   [facility]- crohns    COLONOSCOPY  08/20/2008   Dr. Gala Romney- normal distal rectum, proximal rectum, scattered 1-2 mm aphthous ulcers, these extended into the distal sigmoid.   COLONOSCOPY N/A 10/03/2014   Rindy Kollman: findings c/w mildly active ileocolonic crohn's with scattered aphthous ulcers in the distal sigmoid colon, descending colon, ascending colon, terminal ileum. Transverse colon was normal.   COLONOSCOPY WITH PROPOFOL N/A 05/25/2017   Procedure: COLONOSCOPY WITH PROPOFOL;  Surgeon: Daneil Dolin, MD;  scattered ulcers and erythema involving the rectum colon and terminal ileum consistent with Crohn's disease, one benign 4 mm sessile polyp, s/p frequent segmental biopsies.  Pathology with chronic mildly active ileitis and mildly active colitis in ascending colon, sigmoid colon, and rectum.  Repeat in 2 years.   COLONOSCOPY WITH PROPOFOL N/A 05/21/2020    Surgeon: Daneil Dolin, MD;  IBD with skipped involvement of the colon endoscopically consistent with Crohn's colitis s/p segmental biopsy.  Normal-appearing distal TI, ulcerated ileocecal valve.  Pathology with mildly active chronic colitis in the ileocecal valve, moderately active chronic colitis in the descending colon, severely active chronic colitis with ulceration in the sigmoid colon.    ESOPHAGOGASTRODUODENOSCOPY (EGD) WITH PROPOFOL N/A 02/05/2021   Procedure: ESOPHAGOGASTRODUODENOSCOPY (EGD) WITH PROPOFOL;  Surgeon:  Angla Delahunt, Cristopher Estimable, MD;  [facility];  Service: Endoscopy;  Laterality: N/A;  10:45am   JOINT REPLACEMENT Left    knee   KNEE SURGERY Right 2010   Left Arthoscopic   LEFT HEART CATH AND CORONARY ANGIOGRAPHY N/A 10/27/2020   Procedure: LEFT HEART CATH AND CORONARY ANGIOGRAPHY;  Surgeon: Leonie Man, MD;  Location: [facility];  Service:  Cardiovascular;  Laterality: N/A;   MALONEY DILATION N/A 02/05/2021   Procedure: Venia Minks DILATION;  Surgeon: Daneil Dolin, MD;  [facility];  Service: Endoscopy;  Laterality: N/A;   NECK SURGERY     NECK SURGERY N/A 2005   metal plate in neck   POLYPECTOMY  05/25/2017   Procedure: POLYPECTOMY;  Surgeon: Daneil Dolin, MD;  [facility];  Service: Endoscopy;;  Sigmoid colon (CS)   TOTAL KNEE ARTHROPLASTY Left 02/22/2013   Procedure: LEFT TOTAL KNEE ARTHROPLASTY;  Surgeon: Yvette Rack., MD;  Location: [facility];  Service: Orthopedics;  Laterality: Left;    Prior to Admission medications   Medication Sig Start Date End Date Taking? Authorizing Provider  Adalimumab (HUMIRA PEN) 40 MG/0.4ML PNKT Inject 40 mg into the skin every 14 (fourteen) days. Wednesdays   Yes [provider]  amLODipine (NORVASC) 2.5 MG tablet Take 1 tablet (2.5 mg total) by mouth daily. 10/27/20 10/27/21 Yes Leonie Man, MD  aspirin EC 81 MG tablet Take 81 mg by mouth daily. Swallow whole.   Yes [provider]  cholecalciferol (VITAMIN D3) 25 MCG (1000 UNIT) tablet Take 1,000 Units by mouth daily.   Yes [provider]  cyclobenzaprine (FLEXERIL) 10 MG tablet Take 10 mg by mouth 3 (three) times daily as needed for muscle spasms.   Yes [provider]  hydrochlorothiazide (MICROZIDE) 12.5 MG capsule Take 12.5 mg by mouth daily.   Yes [provider]  lisinopril (PRINIVIL,ZESTRIL) 10 MG tablet Take 10 mg by mouth daily.  03/23/17  Yes [provider]  metFORMIN (GLUCOPHAGE) 500 MG tablet Take 500 mg by mouth daily with supper.   Yes [provider]  nitroGLYCERIN (NITROSTAT) 0.4 MG SL tablet Place 1 tablet (0.4 mg total) under the tongue every 5 (five) minutes as needed for chest pain. 10/14/20 06/15/21 Yes Josue Hector, MD  oxyCODONE-acetaminophen (PERCOCET) 10-325 MG tablet Take 1 tablet by mouth every 6 (six) hours as needed for  pain.   Yes [provider]  tamsulosin (FLOMAX) 0.4 MG CAPS capsule Take 0.4 mg by mouth daily after breakfast. 03/28/17  Yes [provider]    Allergies as of 06/15/2021 - Review Complete 06/15/2021  Allergen Reaction Noted   Toradol [ketorolac tromethamine] Other (See Comments) 02/25/2020    Family History  Problem Relation Age of Onset   Diabetes Mother    Heart failure Mother    Heart failure Father    Colon polyps Father    Cancer - Lung Father    COPD Sister    Diabetes type II Sister    Hypertension Sister    Sleep apnea Sister    Arthritis Sister    Colitis Sister        Maybe?   Irregular heart beat Brother    Alcoholism Brother    Arthritis Brother    Colon cancer Neg Hx    Inflammatory bowel disease Neg Hx     Social History   Socioeconomic History   Marital status: Married    Spouse  name: Not on file   Number of children: Not on file   Years of education: Not on file   Highest education level: Not on file  Occupational History   Not on file  Tobacco Use   Smoking status: Former    Packs/day: 2.00    Years: 10.00    Pack years: 20.00    Types: Cigarettes    Quit date: 02/13/1987    Years since quitting: 34.3   Smokeless tobacco: Never  Vaping Use   Vaping Use: Never used  Substance and Sexual Activity   Alcohol use: No   Drug use: No   Sexual activity: Yes    Birth control/protection: None  Other Topics Concern   Not on file  Social History Narrative   Not on file   Social Determinants of Health   Financial Resource Strain: Not on file  Food Insecurity: Not on file  Transportation Needs: Not on file  Physical Activity: Not on file  Stress: Not on file  Social Connections: Not on file  Intimate Partner Violence: Not on file    Review of Systems: See HPI, otherwise negative ROS  Physical Exam: BP (!) 148/80 (BP Location: Right Arm, Patient Position: Sitting, Cuff Size: Large)    Pulse 77    Temp (!) 97.3 F (36.3  C) (Temporal)    Ht 6' (1.829 m)    Wt (!) 330 lb 9.6 oz (150 kg)    SpO2 93%    BMI 44.84 kg/m  General:   Alert,  Well-developed, well-nourished, pleasant and cooperative in NAD Skin: Some excoriations of both upper extremities with focal areas of scaly psoriaiform appearing lesions.  See representative photograph. Neck:  Supple; no masses or thyromegaly. No significant cervical adenopathy. Lungs:  Clear throughout to auscultation.   No wheezes, crackles, or rhonchi. No acute distress. Heart:  Regular rate and rhythm; no murmurs, clicks, rubs,  or gallops. Abdomen: Significantly obese.  Soft and entirely nontender.  No obvious mass organomegaly  Normal pulses noted. Extremities:  Without clubbing or edema.  New support-right.  Impression/Plan: Morbidly obese 61 year old gentleman with inflammatory bowel disease.  Phenotype historically consistent with Crohn's ileocolitis.  Some discordant features at colonoscopy 1 year ago;   No doubt inflammatory bowel disease.  He has responded extremely well to adalimumab.  Now with a rash.  I have suspicions may be a variant of psoriasis.  However, other possibilities exist.  Clinically, doing very well from the standpoint of his inflammatory bowel disease.  His knee issues are more likely related to osteoarthritis and not IBD related arthropathy.  Before we consider switching out to another biologic, like an IL12, IL 23 agent, would like to get the opinion of a dermatologist.  Dysphagia-improved-status post empiric Maloney dilation last year (normal-appearing esophagus)  Recommendations:  Adalimumab trough and antibody level  Serum CRP and fecal calprotectin  No change in medical regimen for the time being.  However, we may need to pivot on his biological therapy depending on input from dermatology.  Dermatology referral, Dr. Nevada Crane to get his opinion regarding rash.  Weight loss recommended.  Low impact exercise and caloric restricted diet  reviewed.  Further recommendations to follow in the near future      Notice: This dictation was prepared with Dragon dictation along with smaller phrase technology. Any transcriptional errors that result from this process are unintentional and may not be corrected upon review.

## 2021-06-16 ENCOUNTER — Other Ambulatory Visit: Payer: Self-pay

## 2021-06-16 DIAGNOSIS — R21 Rash and other nonspecific skin eruption: Secondary | ICD-10-CM

## 2021-06-21 ENCOUNTER — Telehealth: Payer: Self-pay | Admitting: Internal Medicine

## 2021-06-21 NOTE — Telephone Encounter (Signed)
Called pt, gave # to Dr. Juel Burrow office ?

## 2021-06-21 NOTE — Telephone Encounter (Signed)
Patient was told that if he had not heard from the dermatologist we referred him to, to give [place] a call.  He has yet to hear from them    ?

## 2021-06-25 LAB — CALPROTECTIN, FECAL: Calprotectin, Fecal: 152 ug/g — ABNORMAL HIGH (ref 0–120)

## 2021-06-30 LAB — SERIAL MONITORING

## 2021-07-01 ENCOUNTER — Telehealth: Payer: Self-pay

## 2021-07-01 LAB — ADALIMUMAB+AB (SERIAL MONITOR)
Adalimumab Drug Level: 5.3 ug/mL
Anti-Adalimumab Antibody: <25 ng/mL

## 2021-07-01 LAB — C-REACTIVE PROTEIN: CRP: 3 mg/L (ref 0–10)

## 2021-07-01 NOTE — Telephone Encounter (Signed)
Pt was made aware of results. Pt was seen by Dr. Nevada Crane this morning regarding lesions. Pt stated that Dr. Nevada Crane took a biopsy and gave him Diprolene ointment to put on the lesions. Dr. Nevada Crane has requested that the labs that was done here in the office be sent to him for him to review as well. Pt will contact us and let us know what the results to the biopsy are and whether or not the ointment helps with the lesions.  ?

## 2021-07-01 NOTE — Telephone Encounter (Signed)
Cc'ed labs to Dr Juel Burrow office ?

## 2021-07-01 NOTE — Telephone Encounter (Signed)
-----   Message from Daneil Dolin, MD sent at 07/01/2021  8:00 AM EDT ----- ?So,  he has no antibodies and his drug level is detectable although a bit low.  He is doing well from the standpoint of his IBD.  The real question is whether or not the skin rash is significant and is related to psoriasis.  I wonder how he is doing.  Has he seen a dermatologist?  If rash settles down I do not think we need to change his medication.  Need more information. ? ?

## 2021-07-01 NOTE — Telephone Encounter (Signed)
Noted  

## 2021-07-07 ENCOUNTER — Other Ambulatory Visit: Payer: Self-pay | Admitting: Internal Medicine

## 2021-07-07 NOTE — Telephone Encounter (Signed)
Last ov 06/15/21 ?

## 2021-08-12 ENCOUNTER — Telehealth: Payer: Self-pay | Admitting: *Deleted

## 2021-08-12 NOTE — Telephone Encounter (Signed)
Received approval letter from [employer] for Humira. Sent to the scan center.  ?

## 2021-09-28 ENCOUNTER — Other Ambulatory Visit: Payer: Self-pay | Admitting: Cardiology

## 2021-11-01 ENCOUNTER — Telehealth: Payer: Self-pay | Admitting: *Deleted

## 2021-11-01 NOTE — Telephone Encounter (Signed)
Called [facility] spoke to Wisdom and she informed me that the received prescriber information form. They are waiting to hear from the insurance company.

## 2021-11-02 NOTE — Telephone Encounter (Signed)
Form to be scanned to patient's chart.

## 2021-11-03 ENCOUNTER — Other Ambulatory Visit: Payer: Self-pay

## 2021-11-03 MED ORDER — HUMIRA (2 PEN) 40 MG/0.4ML ~~LOC~~ AJKT: 40.0000 mg | AUTO-INJECTOR | SUBCUTANEOUS | 11 refills | Status: DC

## 2021-11-09 ENCOUNTER — Other Ambulatory Visit: Payer: Self-pay | Admitting: Internal Medicine

## 2021-11-26 ENCOUNTER — Other Ambulatory Visit: Payer: Self-pay | Admitting: Cardiovascular Disease

## 2021-11-27 IMAGING — CT CT RENAL STONE PROTOCOL
2 of 4 series · 16 of 46 positions shown, 18 images · non-contrast
Comparison: CT Abdomen and Pelvis 09/19/2019.

CLINICAL DATA: 59-year-old male with left flank pain radiating to
the lower abdomen for 4 days. History of Crohn disease.

EXAM:
CT ABDOMEN AND PELVIS WITHOUT CONTRAST
TECHNIQUE: Multidetector CT imaging of the abdomen and pelvis was performed
following the standard protocol without IV contrast.

[Series 2: axial st · axial · 0.98mm/px · z∈[-388,+32]mm · 13 of 96 slices shown, 15 images]
[im 6/96  soft-tissue]
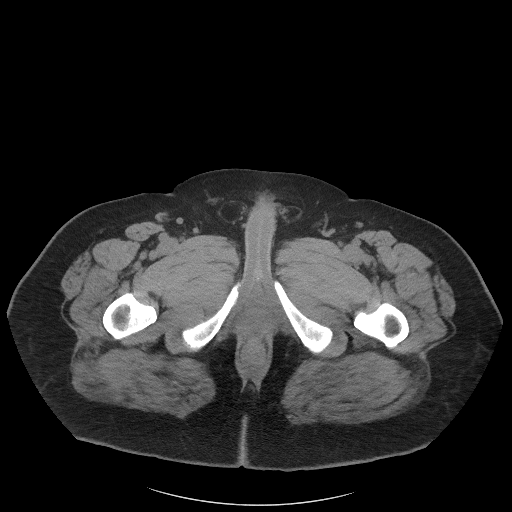
[im 6/96  bone]
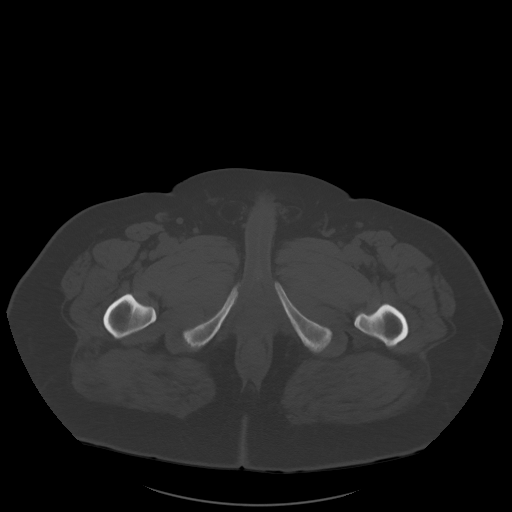
[im 11/96  soft-tissue]
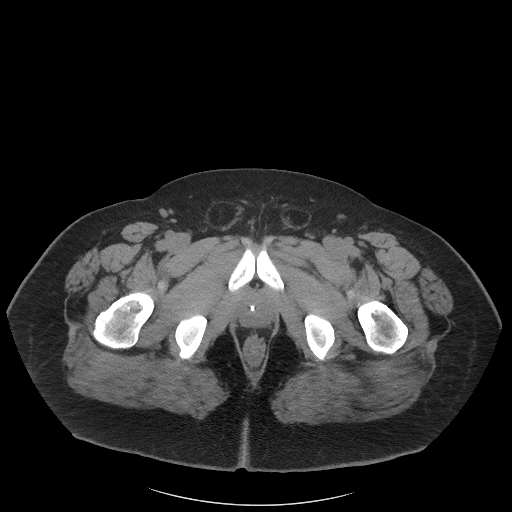
[im 22/96  soft-tissue]
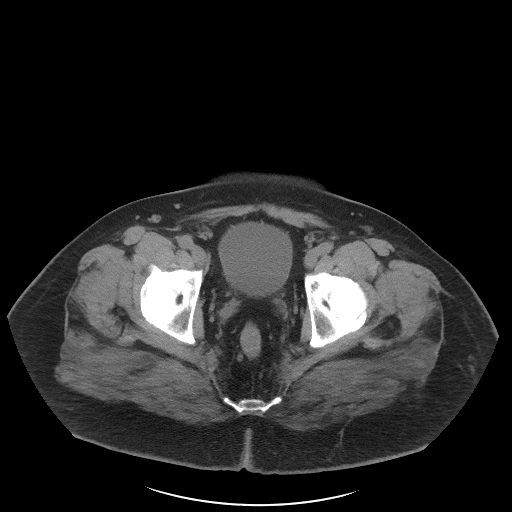
[im 27/96  soft-tissue]
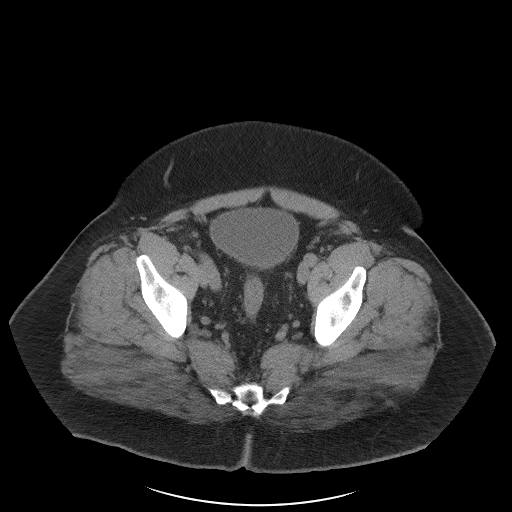
[im 32/96  soft-tissue]
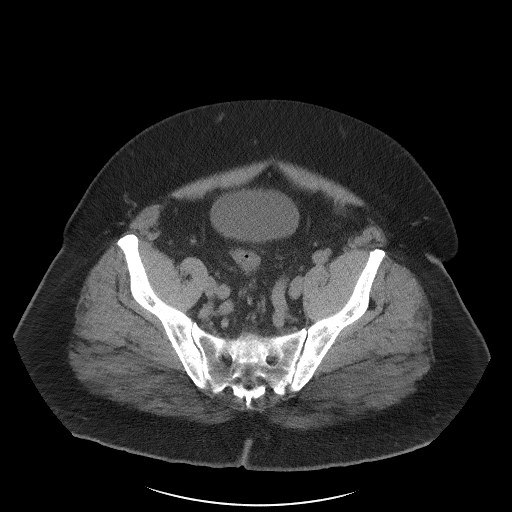
[im 43/96  soft-tissue]
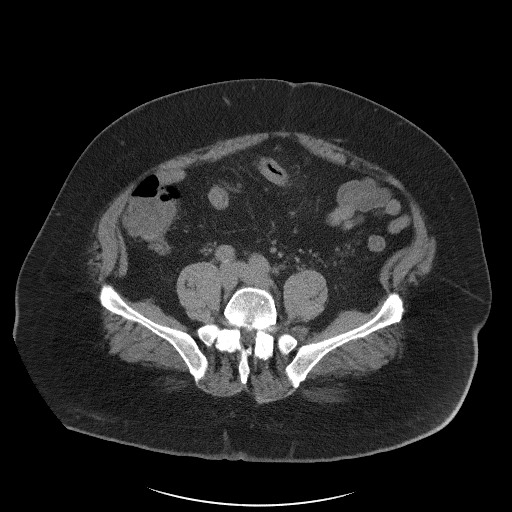
[im 48/96  soft-tissue]
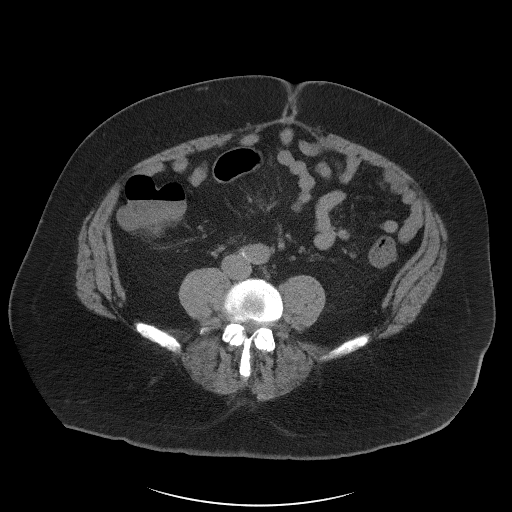
[im 53/96  soft-tissue]
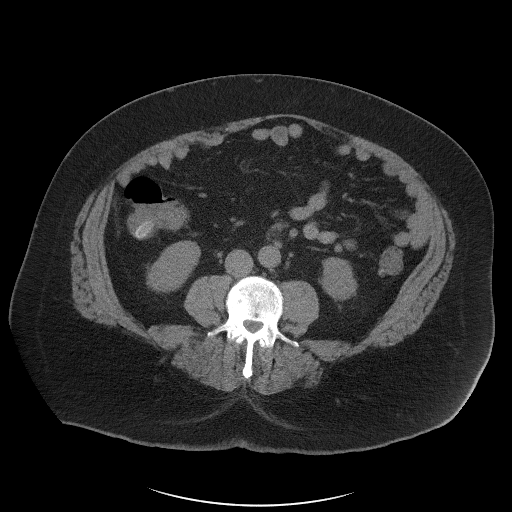
[im 64/96  soft-tissue]
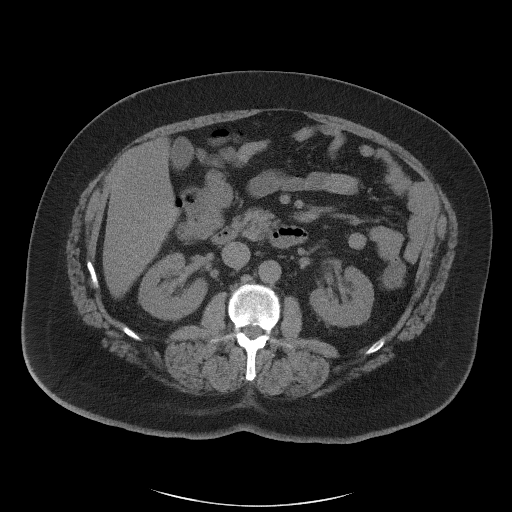
[im 64/96  bone]
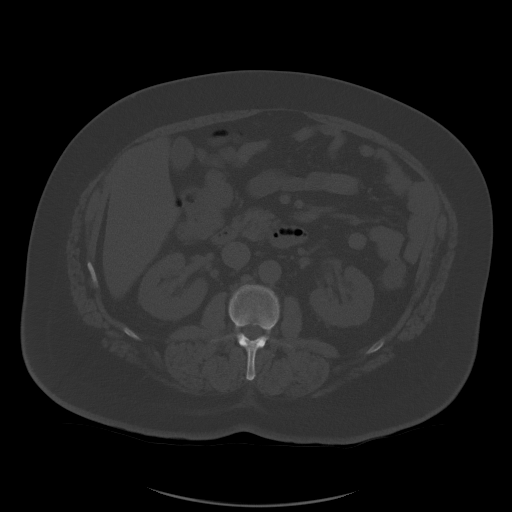
[im 69/96  soft-tissue]
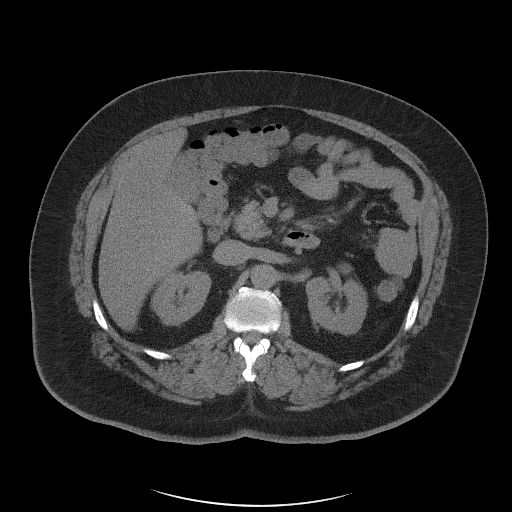
[im 74/96  soft-tissue]
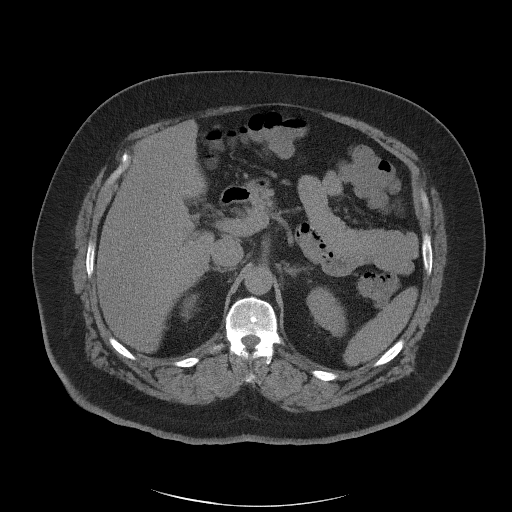
[im 85/96  soft-tissue]
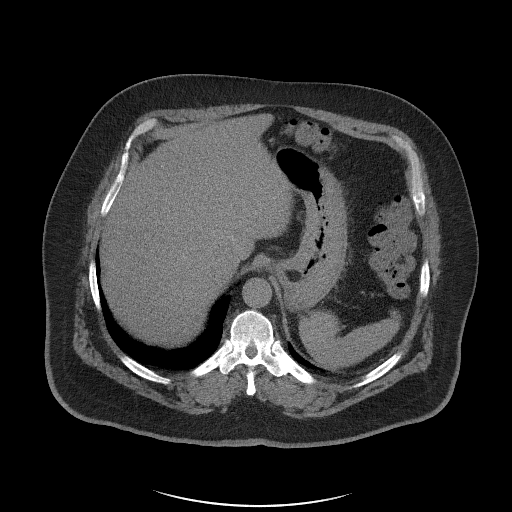
[im 90/96  soft-tissue]
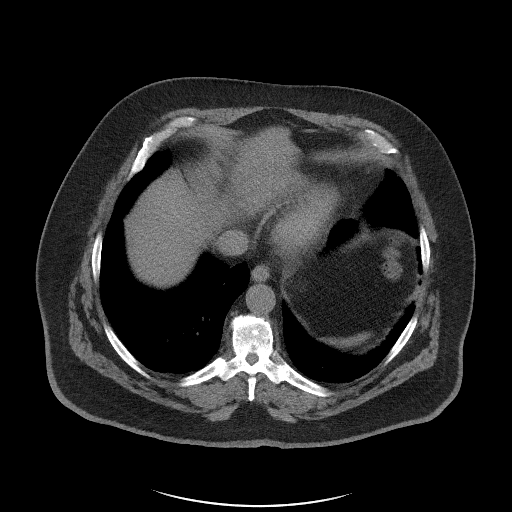

[Series 5: coronal st · coronal · 0.93mm/px · 3 of 108 slices shown]
[im 36/108  soft-tissue]
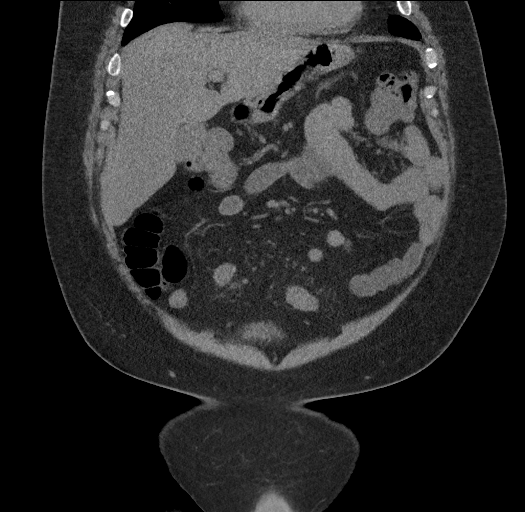
[im 48/108  soft-tissue]
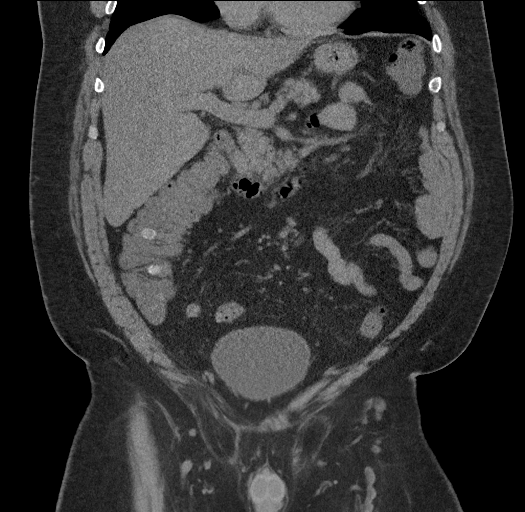
[im 60/108  soft-tissue]
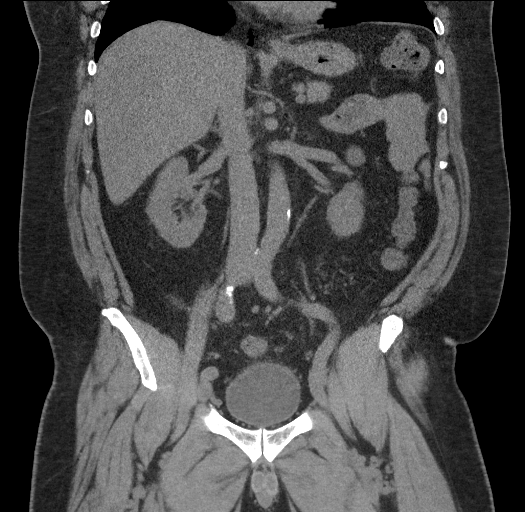

[16 of 46 positions shown; findings below may reference images not displayed]

FINDINGS: Lower chest: Stable mild lung base atelectasis or scarring. No
cardiomegaly. No pericardial or pleural effusion.

Hepatobiliary: Probable hepatic steatosis but otherwise negative
noncontrast liver and gallbladder.

Pancreas: Negative.

Spleen: Negative.

Adrenals/Urinary Tract: Normal adrenal glands. Punctate right
nephrolithiasis. Noncontrast kidneys otherwise appears stable and
negative; small benign left renal midpole cyst again suspected. No
hydronephrosis. The left ureter is decompressed and negative to the
bladder. Mildly distended but otherwise unremarkable urinary bladder
today. Incidental pelvic phleboliths.

Stomach/Bowel: Decompressed rectum with stable small perirectal
lymph nodes since last year (series 2, images 75 and 76). The
sigmoid colon remains decompressed similar to last year. The mid
sigmoid is featureless on series 2, image 55, but with no convincing
mesenteric stranding. Mild diverticulosis of the descending colon.
Upstream retained fluid and stool elsewhere. Normal appendix on
series 2, image 62. No convincing acute large bowel inflammation.

Terminal ileum is within normal limits. No dilated or inflamed small
bowel loops. Decompressed stomach. Small gastric cardia lipoma
suspected and stable on series 2, image 12, likely inconsequential.
Negative duodenum. No free air, free fluid, or mesenteric stranding.

Vascular/Lymphatic: Mild Aortoiliac calcified atherosclerosis.
Normal caliber abdominal aorta. Vascular patency is not evaluated in
the absence of IV contrast.

Small lymph nodes in the small bowel and distal large bowel
mesentery similar to those about the rectum, stable and likely
postinflammatory in this clinical setting.

Reproductive: Small fat containing inguinal hernias suggested on
series 2, image 87, otherwise negative.

Other: No pelvic free fluid.

Musculoskeletal: Chronic lumbar disc and endplate degeneration with
vacuum disc. No acute osseous abnormality identified.
IMPRESSION: 1. Featureless appearance of the sigmoid colon which could be the
sequelae of inflammatory bowel disease in this setting. But no
active bowel inflammation is identified.

2. Punctate nephrolithiasis with no obstructive uropathy.

3. No other acute or inflammatory process identified in the
non-contrast abdomen or pelvis.
Probable hepatic steatosis.   Mild aortic atherosclerosis.

## 2022-01-24 ENCOUNTER — Other Ambulatory Visit: Payer: Self-pay | Admitting: Cardiovascular Disease

## 2022-02-28 ENCOUNTER — Other Ambulatory Visit: Payer: Self-pay | Admitting: Cardiovascular Disease

## 2022-03-28 ENCOUNTER — Other Ambulatory Visit: Payer: Self-pay | Admitting: Cardiovascular Disease

## 2022-04-28 ENCOUNTER — Telehealth: Payer: Self-pay | Admitting: *Deleted

## 2022-04-28 NOTE — Telephone Encounter (Signed)
NOTED

## 2022-04-28 NOTE — Telephone Encounter (Signed)
[facility] called and needs pt insurances informations to cover Humira. I gave them the information that is on file. They stated that is not what they have and need to speak with pt. I called the pt and left a message for him to call back. This is the number he needs to call [phone].

## 2022-05-03 ENCOUNTER — Telehealth: Payer: Self-pay

## 2022-05-03 NOTE — Telephone Encounter (Signed)
PA for Humira was submitted. Waiting for insurance response.

## 2022-05-04 NOTE — Telephone Encounter (Signed)
PA for Humira Pen has been approved from 08/14/2022 through 04/10/2098. Approval letter to be scanned into patient's chart.

## 2022-05-24 ENCOUNTER — Other Ambulatory Visit: Payer: Self-pay | Admitting: Cardiovascular Disease

## 2022-06-27 ENCOUNTER — Other Ambulatory Visit: Payer: Self-pay | Admitting: Cardiovascular Disease

## 2022-07-05 ENCOUNTER — Other Ambulatory Visit: Payer: Self-pay | Admitting: Cardiovascular Disease

## 2022-07-27 ENCOUNTER — Other Ambulatory Visit (HOSPITAL_COMMUNITY): Payer: Self-pay | Admitting: Orthopedic Surgery

## 2022-07-27 DIAGNOSIS — M25511 Pain in right shoulder: Secondary | ICD-10-CM

## 2022-08-04 ENCOUNTER — Telehealth: Payer: Self-pay | Admitting: Cardiovascular Disease

## 2022-08-04 NOTE — Telephone Encounter (Signed)
Patient called to say that he got his PCP to refill his prescription, so he dont need an appt with Dr. Eden Emms. Please advise

## 2022-08-04 NOTE — Telephone Encounter (Signed)
Number listed for CB has no VM  Left message on home number that patient may f/u with his pcp   He was only PRN with [facility]

## 2022-08-18 ENCOUNTER — Ambulatory Visit (HOSPITAL_COMMUNITY): Payer: Commercial Managed Care - PPO

## 2022-08-18 ENCOUNTER — Encounter (HOSPITAL_COMMUNITY): Payer: Self-pay

## 2022-09-08 ENCOUNTER — Encounter: Payer: Self-pay | Admitting: Physical Therapy

## 2022-09-08 ENCOUNTER — Other Ambulatory Visit: Payer: Self-pay

## 2022-09-08 ENCOUNTER — Ambulatory Visit: Payer: Commercial Managed Care - PPO | Attending: Orthopedic Surgery | Admitting: Physical Therapy

## 2022-09-08 DIAGNOSIS — M25611 Stiffness of right shoulder, not elsewhere classified: Secondary | ICD-10-CM | POA: Diagnosis present

## 2022-09-08 DIAGNOSIS — G8929 Other chronic pain: Secondary | ICD-10-CM | POA: Diagnosis present

## 2022-09-08 DIAGNOSIS — M25511 Pain in right shoulder: Secondary | ICD-10-CM | POA: Diagnosis present

## 2022-09-08 NOTE — Therapy (Signed)
OUTPATIENT PHYSICAL THERAPY SHOULDER EVALUATION   Patient Name: Ricky Blackwell MRN: [id number] DOB:[birth year], 62 y.o., male Today's Date: 09/08/2022  END OF SESSION:  PT End of Session - 09/08/22 1608     Visit Number 1    Number of Visits 8    Date for PT Re-Evaluation 11/03/22    Authorization Type FOTO    PT Start Time 0316    PT Stop Time 0404    PT Time Calculation (min) 48 min    Activity Tolerance Patient tolerated treatment well    Behavior During Therapy [facility] for tasks assessed/performed             Past Medical History:  Diagnosis Date   Anal stenosis    Anginal pain (HCC)    Arthritis    Colitis    COVID-19 04/2020   Crohn's disease (HCC) 1993   Diabetes mellitus without complication (HCC)    H/O hiatal hernia    History of kidney stones    Hypertension    Irritable bowel syndrome    Pneumonia    hx   Rectal bleeding    Unspecified constipation    Past Surgical History:  Procedure Laterality Date   ABDOMINAL HERNIA REPAIR  2010   BACK SURGERY  2000   BIOPSY  05/21/2020   Procedure: BIOPSY;  Surgeon: Corbin Ade, MD;  Location: [facility];  Service: Endoscopy;;   CARDIAC CATHETERIZATION     COLONOSCOPY  05/2004   Dr. Madilyn [facility]or[facility]- crohns    COLONOSCOPY  08/20/2008   Dr. Jena Gauss- normal distal rectum, proximal rectum, scattered 1-2 mm aphthous ulcers, these extended into the distal sigmoid.   COLONOSCOPY N/A 10/03/2014   [name]: findings c/w mildly active ileocolonic crohn's with scattered aphthous ulcers in the distal sigmoid colon, descending colon, ascending colon, terminal ileum. Transverse colon was normal.   COLONOSCOPY WITH PROPOFOL N/A 05/25/2017   Procedure: COLONOSCOPY WITH PROPOFOL;  Surgeon: Corbin Ade, MD;  scattered ulcers and erythema involving the rectum colon and terminal ileum consistent with Crohn's disease, one benign 4 mm sessile polyp, s/p frequent segmental biopsies.  Pathology with chronic mildly active ileitis  and mildly active colitis in ascending colon, sigmoid colon, and rectum.  Repeat in 2 years.   COLONOSCOPY WITH PROPOFOL N/A 05/21/2020    Surgeon: Corbin Ade, MD;  IBD with skipped involvement of the colon endoscopically consistent with Crohn's colitis s/p segmental biopsy.  Normal-appearing distal TI, ulcerated ileocecal valve.  Pathology with mildly active chronic colitis in the ileocecal valve, moderately active chronic colitis in the descending colon, severely active chronic colitis with ulceration in the sigmoid colon.    ESOPHAGOGASTRODUODENOSCOPY (EGD) WITH PROPOFOL N/A 02/05/2021   Procedure: ESOPHAGOGASTRODUODENOSCOPY (EGD) WITH PROPOFOL;  Surgeon: Corbin Ade, MD;  Location: [facility];  Service: Endoscopy;  Laterality: N/A;  10:45am   JOINT REPLACEMENT Left    knee   KNEE SURGERY Right 2010   Left Arthoscopic   LEFT HEART CATH AND CORONARY ANGIOGRAPHY N/A 10/27/2020   Procedure: LEFT HEART CATH AND CORONARY ANGIOGRAPHY;  Surgeon: Marykay Lex, MD;  [facility] INVASIVE CV LAB;  Service: Cardiovascular;  Laterality: N/A;   MALONEY DILATION N/A 02/05/2021   Procedure: Elease Hashimoto DILATION;  Surgeon: Corbin Ade, MD;  Location: [facility];  Service: Endoscopy;  Laterality: N/A;   NECK SURGERY     NECK SURGERY N/A 2005   metal plate in neck   POLYPECTOMY  05/25/2017  Procedure: POLYPECTOMY;  Surgeon: Corbin Ade, MD;  Location: [facility];  Service: Endoscopy;;  Sigmoid colon (CS)   TOTAL KNEE ARTHROPLASTY Left 02/22/2013   Procedure: LEFT TOTAL KNEE ARTHROPLASTY;  Surgeon: Thera Flake., MD;  Location: [facility] OR;  Service: Orthopedics;  Laterality: Left;   Patient Active Problem List   Diagnosis Date Noted   Abnormal nuclear stress test    Progressive angina (HCC)    Dysphagia 08/17/2020   Cutaneous abscess of buttock 08/17/2020   Gastroesophageal reflux disease 08/17/2020   Atypical chest pain 10/17/2019   LLQ pain 09/19/2019   Diarrhea 09/19/2019    Crohn's disease of both small and large intestine without complication (HCC) 04/06/2016   Mucosal abnormality of colon    Crohn's colitis (HCC) 09/09/2014   Crohn's disease (HCC) 06/26/2008   COLITIS 06/26/2008   Constipation 06/26/2008   ANAL STENOSIS 06/26/2008   RECTAL BLEEDING 06/26/2008   IRRITABLE BOWEL SYNDROME, HX OF 06/26/2008    REFERRING PROVIDER: Teryl Lucy MD  REFERRING DIAG: Right shoulder impingement and cervicalgia   THERAPY DIAG:  Chronic right shoulder pain  Stiffness of right shoulder joint  Rationale for Evaluation and Treatment: Rehabilitation  ONSET DATE: ~9 months.  SUBJECTIVE:                                                                                                                                                                                      SUBJECTIVE STATEMENT: The patient presents to the clinic with c/o right shoulder pain that will radiate into the left side of his neck.  This problem began about 9 months ago.  Attempts at raising his right arm overhead is very painful.  He is unable to sleep on his right side.  Keeping arm by side decreases pain.  His pain is rated at a 6/10 today but can be severe with certain movements.    PERTINENT HISTORY: DM, ACDF.  PAIN:  Are you having pain? Yes: NPRS scale: 6/10 Pain location: Right shoulder. Pain description: Ache. Aggravating factors: As above. Relieving factors: As above.  PRECAUTIONS: None  WEIGHT BEARING RESTRICTIONS: No  FALLS:  Has patient fallen in last 6 months? No  LIVING ENVIRONMENT: Lives with: lives with their spouse Lives in: House/apartment Has following equipment at home: None  OCCUPATION: He works at [employer]  PLOF: Independent  PATIENT GOALS:Use right UE without pain.  NEXT MD VISIT:   OBJECTIVE:   DIAGNOSTIC FINDINGS:  Patient said he had an MRI "but it wasn't bad enough to have surgery."  PATIENT SURVEYS:  FOTO .  POSTURE: Forward  head and rounded shoulders.  UPPER EXTREMITY ROM:  Active right shoulder antigravity flexion to 135 degrees and painful (left is 150 degrees).  He exhibits full ER and behind back to L5-S1 region. Bilateral active cervical rotation to 70 degrees.  UPPER EXTREMITY MMT: With elbow by side his right shoulder ER/IR is 4+/5.  Deltoid strength 4 to 4+/5.  SHOULDER SPECIAL TESTS: Impingement tests: Neer impingement test: positive , Hawkins/Kennedy impingement test: positive , and Painful arc test: positive  Biceps assessment: Speed's test: positive  Normal bilateral UE DTR's.  PALPATION:  Tender to palpation over patient's right anterior deltoid, bicipital groove (pain will radiate into muscle belly of biceps) and acromial ridge/middle deltoid region.  Some mild tenderness over his right UT.   TODAY'S TREATMENT:                                                                                                                                         DATE: HMP and IFC at 80-150 Hz on 40% scan x 20 minutes to patient's right shoulder. Patient tolerated treatment without complaint with normal modality response following removal of modality.  PATIENT EDUCATION: Education details: Yellow theraband resisted right shoulder ER.  Discussed TENS usage (he has one) and short duration ice massage. Person educated: Patient Education method: Explanation, Demonstration, and Handouts Education comprehension: verbalized understanding and returned demonstration  HOME EXERCISE PROGRAM: PROGRAM HOME EXERCISE PROGRAM Created by [place] Rawley Harju May 30th, 2024 View at [email or web address] using code: [email or web address]  Page 1 of 1 1 Exercise ELASTIC BAND SHOULDER EXTERNAL ROTATION - ER While holding an elastic band at your side with your elbow bent, start with your hand near your stomach and then pull the band away. Keep your elbow at your side the entire time. Repeat 15 Times Hold 2 Seconds Complete 2 Sets Perform 2  Times a Day  ASSESSMENT:  CLINICAL IMPRESSION: The patient presents to OPPT with c/o right shoulder pain that can become very severe with certain movements and attempts at raising his right arm overhead.  He has limited active right shoulder flexion.  He demonstrated positive impingement testing and a positive right Speed's test.  He is tender to palpation over his right anterior deltoid, bicipital groove (pain will radiate into muscle belly of biceps) and acromial ridge/middle deltoid region.  Some mild tenderness over his right UT.  His FOTO limitation score is a 50.  Patient will benefit from skilled physical therapy intervention to address pain and deficits.  OBJECTIVE IMPAIRMENTS: decreased activity tolerance, decreased ROM, decreased strength, increased muscle spasms, and pain.   ACTIVITY LIMITATIONS: carrying, lifting, and reach over head  PERSONAL FACTORS: Time since onset of injury/illness/exacerbation and 1-2 comorbidities: ACDF, DM  are also affecting patient's functional outcome.   REHAB POTENTIAL: Good  CLINICAL DECISION MAKING: Stable/uncomplicated  EVALUATION COMPLEXITY: Low   GOALS:  SHORT TERM GOALS: Target date: 09/22/22  Ind with an initial HEP. Goal status: INITIAL  LONG TERM GOALS:  Target date: 11/03/22  Ind with an advanced HEP.  Goal status: INITIAL  2.  Active right shoulder flexion to 150 degrees so the patient can easily reach overhead with pain not > 3/10. Goal status: INITIAL  3.  Increase right shoulder strength to a solid 5/5 to increase stability for performance of functional activities.  Goal status: INITIAL  4.  Perform ADL's with pain not > 3/10.  Goal status: INITIAL  PLAN:  PT FREQUENCY: 1x/week..Patient requesting one visit a week.  PT DURATION: 8 weeks  PLANNED INTERVENTIONS: Therapeutic exercises, Therapeutic activity, Patient/Family education, Self Care, Dry Needling, Electrical stimulation, Cryotherapy, Moist heat, Vasopneumatic  device, Ultrasound, and Manual therapy  PLAN FOR NEXT SESSION: Combo e'stim/US, RW4, STW/M.  Please review HEP.   Cassandria Drew, [place], PT 09/08/2022, 4:42 PM

## 2022-09-15 ENCOUNTER — Ambulatory Visit: Payer: Commercial Managed Care - PPO | Attending: Orthopedic Surgery

## 2022-09-15 DIAGNOSIS — M25611 Stiffness of right shoulder, not elsewhere classified: Secondary | ICD-10-CM | POA: Insufficient documentation

## 2022-09-15 DIAGNOSIS — M25511 Pain in right shoulder: Secondary | ICD-10-CM | POA: Diagnosis present

## 2022-09-15 DIAGNOSIS — G8929 Other chronic pain: Secondary | ICD-10-CM | POA: Insufficient documentation

## 2022-09-15 NOTE — Therapy (Signed)
OUTPATIENT PHYSICAL THERAPY SHOULDER EVALUATION   Patient Name: Ricky Blackwell MRN: [id number] DOB:1960/05/27, 62 y.o., male Today's Date: 09/15/2022  END OF SESSION:  PT End of Session - 09/15/22 1519     Visit Number 2    Number of Visits 8    Date for PT Re-Evaluation 11/03/22    Authorization Type FOTO    PT Start Time 1515    PT Stop Time 1615    PT Time Calculation (min) 60 min    Activity Tolerance Patient tolerated treatment well    Behavior During Therapy [facility] for tasks assessed/performed             Past Medical History:  Diagnosis Date   Anal stenosis    Anginal pain (HCC)    Arthritis    Colitis    COVID-19 04/2020   Crohn's disease (HCC) 1993   Diabetes mellitus without complication (HCC)    H/O hiatal hernia    History of kidney stones    Hypertension    Irritable bowel syndrome    Pneumonia    hx   Rectal bleeding    Unspecified constipation    Past Surgical History:  Procedure Laterality Date   ABDOMINAL HERNIA REPAIR  2010   BACK SURGERY  2000   BIOPSY  05/21/2020   Procedure: BIOPSY;  Surgeon: Corbin Ade, MD;  [facility];  Service: Endoscopy;;   CARDIAC CATHETERIZATION     COLONOSCOPY  05/2004   Dr. Madilyn FiremanDebor[facility]- crohns    COLONOSCOPY  08/20/2008   Dr. Jena Gauss- normal distal rectum, proximal rectum, scattered 1-2 mm aphthous ulcers, these extended into the distal sigmoid.   COLONOSCOPY N/A 10/03/2014   [name]: findings c/w mildly active ileocolonic crohn's with scattered aphthous ulcers in the distal sigmoid colon, descending colon, ascending colon, terminal ileum. Transverse colon was normal.   COLONOSCOPY WITH PROPOFOL N/A 05/25/2017   Procedure: COLONOSCOPY WITH PROPOFOL;  Surgeon: Corbin Ade, MD;  scattered ulcers and erythema involving the rectum colon and terminal ileum consistent with Crohn's disease, one benign 4 mm sessile polyp, s/p frequent segmental biopsies.  Pathology with chronic mildly active ileitis and  mildly active colitis in ascending colon, sigmoid colon, and rectum.  Repeat in 2 years.   COLONOSCOPY WITH PROPOFOL N/A 05/21/2020    Surgeon: Corbin Ade, MD;  IBD with skipped involvement of the colon endoscopically consistent with Crohn's colitis s/p segmental biopsy.  Normal-appearing distal TI, ulcerated ileocecal valve.  Pathology with mildly active chronic colitis in the ileocecal valve, moderately active chronic colitis in the descending colon, severely active chronic colitis with ulceration in the sigmoid colon.    ESOPHAGOGASTRODUODENOSCOPY (EGD) WITH PROPOFOL N/A 02/05/2021   Procedure: ESOPHAGOGASTRODUODENOSCOPY (EGD) WITH PROPOFOL;  Surgeon: Corbin Ade, MD;  [facility];  Service: Endoscopy;  Laterality: N/A;  10:45am   JOINT REPLACEMENT Left    knee   KNEE SURGERY Right 2010   Left Arthoscopic   LEFT HEART CATH AND CORONARY ANGIOGRAPHY N/A 10/27/2020   Procedure: LEFT HEART CATH AND CORONARY ANGIOGRAPHY;  Surgeon: Marykay Lex, MD;  Location: [facility] INVASIVE CV LAB;  Service: Cardiovascular;  Laterality: N/A;   MALONEY DILATION N/A 02/05/2021   Procedure: Elease Hashimoto DILATION;  Surgeon: Corbin Ade, MD;  [facility];  Service: Endoscopy;  Laterality: N/A;   NECK SURGERY     NECK SURGERY N/A 2005   metal plate in neck   POLYPECTOMY  05/25/2017  Procedure: POLYPECTOMY;  Surgeon: Corbin Ade, MD;  [facility];  Service: Endoscopy;;  Sigmoid colon (CS)   TOTAL KNEE ARTHROPLASTY Left 02/22/2013   Procedure: LEFT TOTAL KNEE ARTHROPLASTY;  Surgeon: Thera Flake., MD;  Location: [facility];  Service: Orthopedics;  Laterality: Left;   Patient Active Problem List   Diagnosis Date Noted   Abnormal nuclear stress test    Progressive angina (HCC)    Dysphagia 08/17/2020   Cutaneous abscess of buttock 08/17/2020   Gastroesophageal reflux disease 08/17/2020   Atypical chest pain 10/17/2019   LLQ pain 09/19/2019   Diarrhea 09/19/2019    Crohn's disease of both small and large intestine without complication (HCC) 04/06/2016   Mucosal abnormality of colon    Crohn's colitis (HCC) 09/09/2014   Crohn's disease (HCC) 06/26/2008   COLITIS 06/26/2008   Constipation 06/26/2008   ANAL STENOSIS 06/26/2008   RECTAL BLEEDING 06/26/2008   IRRITABLE BOWEL SYNDROME, HX OF 06/26/2008    REFERRING PROVIDER: Teryl Lucy MD  REFERRING DIAG: Right shoulder impingement and cervicalgia   THERAPY DIAG:  Chronic right shoulder pain  Stiffness of right shoulder joint  Rationale for Evaluation and Treatment: Rehabilitation  ONSET DATE: ~9 months.  SUBJECTIVE:                                                                                                                                                                                      SUBJECTIVE STATEMENT: Pt arrives for today's treatment session denying any shoulder pain, but does report increased low back pain today.  PERTINENT HISTORY: DM, ACDF.  PAIN:  Are you having pain? Yes: NPRS scale: 7/10 Pain location: low back Pain description: Ache. Aggravating factors: As above. Relieving factors: As above.  PRECAUTIONS: None  WEIGHT BEARING RESTRICTIONS: No  FALLS:  Has patient fallen in last 6 months? No  LIVING ENVIRONMENT: Lives with: lives with their spouse Lives in: House/apartment Has following equipment at home: None  OCCUPATION: He works at [employer]  PLOF: Independent  PATIENT GOALS:Use right UE without pain.  NEXT MD VISIT:   OBJECTIVE:   DIAGNOSTIC FINDINGS:  Patient said he had an MRI "but it wasn't bad enough to have surgery."  PATIENT SURVEYS:  FOTO .  POSTURE: Forward head and rounded shoulders.  UPPER EXTREMITY ROM:   Active right shoulder antigravity flexion to 135 degrees and painful (left is 150 degrees).  He exhibits full ER and behind back to L5-S1 region. Bilateral active cervical rotation to 70 degrees.  UPPER  EXTREMITY MMT: With elbow by side his right shoulder ER/IR is 4+/5.  Deltoid strength 4 to 4+/5.  SHOULDER SPECIAL TESTS:  Impingement tests: Neer impingement test: positive , Hawkins/Kennedy impingement test: positive , and Painful arc test: positive  Biceps assessment: Speed's test: positive  Normal bilateral UE DTR's.  PALPATION:  Tender to palpation over patient's right anterior deltoid, bicipital groove (pain will radiate into muscle belly of biceps) and acromial ridge/middle deltoid region.  Some mild tenderness over his right UT.   TODAY'S TREATMENT:                                                                                                                                         DATE:                                     EXERCISE LOG  Exercise Repetitions and Resistance Comments  Nustep Lvl 3 x 15 mins   Wall Push Ups    Rows Red x 20 reps   Extension Red x 20 reps   IR Red x 20 reps   Protraction  Red x 20 reps    Blank cell = exercise not performed today   Manual Therapy Soft Tissue Mobilization: right bicep tendon and anterior deltoid, STW/M to right bicep tendon and anterior deltoid to decrease pain and tone with pt seated for comfort    Modalities  Date:  Unattended Estim: Shoulder, IFC 80-150 Hz, 15 mins, Pain Vaso: Shoulder, 34 degrees; low pressure, 15 mins, Pain    PATIENT EDUCATION: Education details: Yellow theraband resisted right shoulder ER.  Discussed TENS usage (he has one) and short duration ice massage. Person educated: Patient Education method: Explanation, Demonstration, and Handouts Education comprehension: verbalized understanding and returned demonstration  HOME EXERCISE PROGRAM: PROGRAM HOME EXERCISE PROGRAM Created by [place] Applegate May 30th, 2024 View at [email or web address] using code: [email or web address]  Page 1 of 1 1 Exercise ELASTIC BAND SHOULDER EXTERNAL ROTATION - ER While holding an elastic band at your side with your elbow  bent, start with your hand near your stomach and then pull the band away. Keep your elbow at your side the entire time. Repeat 15 Times Hold 2 Seconds Complete 2 Sets Perform 2 Times a Day  [email or web address]  Access Code: [email or web address]  ASSESSMENT:  CLINICAL IMPRESSION: Pt arrives for today's treatment session reporting 7/10 low back and minimal right shoulder pain.  Pt instructed in standing resisted right shoulder tband exercises today with min cues for proper technique and posture.  Pt requiring verbal and tactile cues to keep elbow at side for IR.  STW/M performed to right anterior deltoid and bicep tendon to decrease pain and tone.  Normal responses to estim and vaso noted upon removal.  Pt reported decreased shoulder pain at completion of today's treatment session. Pt given printed HEP and red and green tband for home use.  Pt plans to go on hold for a few weeks and  will call the facility to let us know how to proceed.  OBJECTIVE IMPAIRMENTS: decreased activity tolerance, decreased ROM, decreased strength, increased muscle spasms, and pain.   ACTIVITY LIMITATIONS: carrying, lifting, and reach over head  PERSONAL FACTORS: Time since onset of injury/illness/exacerbation and 1-2 comorbidities: ACDF, DM  are also affecting patient's functional outcome.   REHAB POTENTIAL: Good  CLINICAL DECISION MAKING: Stable/uncomplicated  EVALUATION COMPLEXITY: Low   GOALS:  SHORT TERM GOALS: Target date: 09/22/22  Ind with an initial HEP. Goal status: INITIAL  LONG TERM GOALS: Target date: 11/03/22  Ind with an advanced HEP.  Goal status: INITIAL  2.  Active right shoulder flexion to 150 degrees so the patient can easily reach overhead with pain not > 3/10. Goal status: INITIAL  3.  Increase right shoulder strength to a solid 5/5 to increase stability for performance of functional activities.  Goal status: INITIAL  4.  Perform ADL's with pain not > 3/10.  Goal status:  INITIAL  PLAN:  PT FREQUENCY: 1x/week..Patient requesting one visit a week.  PT DURATION: 8 weeks  PLANNED INTERVENTIONS: Therapeutic exercises, Therapeutic activity, Patient/Family education, Self Care, Dry Needling, Electrical stimulation, Cryotherapy, Moist heat, Vasopneumatic device, Ultrasound, and Manual therapy  PLAN FOR NEXT SESSION: Combo e'stim/US, RW4, STW/M.  Please review HEP.   Newman Pies, PTA 09/15/2022, 4:24 PM

## 2022-11-02 ENCOUNTER — Other Ambulatory Visit: Payer: Self-pay | Admitting: Internal Medicine

## 2023-10-03 ENCOUNTER — Encounter: Payer: Self-pay | Admitting: *Deleted

## 2023-10-03 ENCOUNTER — Other Ambulatory Visit: Payer: Self-pay | Admitting: *Deleted

## 2023-10-03 ENCOUNTER — Telehealth: Payer: Self-pay | Admitting: *Deleted

## 2023-10-03 ENCOUNTER — Encounter: Payer: Self-pay | Admitting: Internal Medicine

## 2023-10-03 ENCOUNTER — Ambulatory Visit: Admitting: Internal Medicine

## 2023-10-03 VITALS — BP 138/76 | HR 59 | Temp 98.2°F | Ht 72.0 in | Wt 309.0 lb

## 2023-10-03 DIAGNOSIS — K5080 Crohn's disease of both small and large intestine without complications: Secondary | ICD-10-CM | POA: Diagnosis not present

## 2023-10-03 DIAGNOSIS — R21 Rash and other nonspecific skin eruption: Secondary | ICD-10-CM | POA: Diagnosis not present

## 2023-10-03 DIAGNOSIS — K5090 Crohn's disease, unspecified, without complications: Secondary | ICD-10-CM

## 2023-10-03 MED ORDER — PEG 3350-KCL-NA BICARB-NACL 420 G PO SOLR: 4000.0000 mL | Freq: Once | ORAL | 0 refills | Status: AC

## 2023-10-03 NOTE — Patient Instructions (Addendum)
 It was good to see you again today!  You are doing very well clinically from the standpoint of your Crohn's disease.  We need to do a colonoscopy now to assess disease activity.  You are due as it has been 3 years since your last colonoscopy.  We need to see how Humira  is working prior to switching you to the biosimilar (generic)  Your current rash looks like possible psoriasis to me.  It is recommended you see the dermatologist.  I do not think this rash is related to Humira   Will plan to perform a colonoscopy in the near future history of Crohn's colitis ASA 3.  Further recommendations to follow.

## 2023-10-03 NOTE — H&P (View-Only) (Signed)
 Primary Care Physician:  Renato Dorothey HERO, NP Primary Gastroenterologist:  Dr. Shaaron  Pre-Procedure History & Physical: HPI:  Ricky Blackwell is a 63 y.o. male here for follow-up of ileocolonic Crohn's colitis.  Started Humira  approximately 3 years ago.  Amazingly has not been back to the office.  His bowel symptoms have resolved.  He has no diarrhea no tenesmus no rectal bleeding no abdominal pain he did have a rash with initial adalimumab  seen by Dr. Shona the dermatologist feel.  Felt not to be related to adalimumab  he changed his laundry detergent and used a hypoallergenic soap and that rash resolved.  Recently started on Jardiance -  now witth a pruritic rash now in the groin area and in his back he stopped Jardiance he still has issues.  Insurance dictated he go to a bio similar; he is due for follow-up colonoscopy.  This ought to be done before we switch him to a biosimilar to assess disease activity objectively.  Past Medical History:  Diagnosis Date   Anal stenosis    Anginal pain (HCC)    Arthritis    Colitis    COVID-19 04/2020   Crohn's disease (HCC) 1993   Diabetes mellitus without complication (HCC)    H/O hiatal hernia    History of kidney stones    Hypertension    Irritable bowel syndrome    Pneumonia    hx   Rectal bleeding    Unspecified constipation     Past Surgical History:  Procedure Laterality Date   ABDOMINAL HERNIA REPAIR  2010   BACK SURGERY  2000   BIOPSY  05/21/2020   Procedure: BIOPSY;  Surgeon: Shaaron Lamar HERO, MD;  [facility];  Service: Endoscopy;;   CARDIAC CATHETERIZATION     COLONOSCOPY  05/2004   Dr. DyaneGLENWOOD [facility]- crohns    COLONOSCOPY  08/20/2008   Dr. Shaaron- normal distal rectum, proximal rectum, scattered 1-2 mm aphthous ulcers, these extended into the distal sigmoid.   COLONOSCOPY N/A 10/03/2014   Aliha Diedrich: findings c/w mildly active ileocolonic crohn's with scattered aphthous ulcers in the distal sigmoid colon,  descending colon, ascending colon, terminal ileum. Transverse colon was normal.   COLONOSCOPY WITH PROPOFOL  N/A 05/25/2017   Procedure: COLONOSCOPY WITH PROPOFOL ;  Surgeon: Shaaron Lamar HERO, MD;  scattered ulcers and erythema involving the rectum colon and terminal ileum consistent with Crohn's disease, one benign 4 mm sessile polyp, s/p frequent segmental biopsies.  Pathology with chronic mildly active ileitis and mildly active colitis in ascending colon, sigmoid colon, and rectum.  Repeat in 2 years.   COLONOSCOPY WITH PROPOFOL  N/A 05/21/2020    Surgeon: Shaaron Lamar HERO, MD;  IBD with skipped involvement of the colon endoscopically consistent with Crohn's colitis s/p segmental biopsy.  Normal-appearing distal TI, ulcerated ileocecal valve.  Pathology with mildly active chronic colitis in the ileocecal valve, moderately active chronic colitis in the descending colon, severely active chronic colitis with ulceration in the sigmoid colon.    ESOPHAGOGASTRODUODENOSCOPY (EGD) WITH PROPOFOL  N/A 02/05/2021   Procedure: ESOPHAGOGASTRODUODENOSCOPY (EGD) WITH PROPOFOL ;  Surgeon: Shaaron Lamar HERO, MD;  [facility];  Service: Endoscopy;  Laterality: N/A;  10:45am   JOINT REPLACEMENT Left    knee   KNEE SURGERY Right 2010   Left Arthoscopic   LEFT HEART CATH AND CORONARY ANGIOGRAPHY N/A 10/27/2020   Procedure: LEFT HEART CATH AND CORONARY ANGIOGRAPHY;  Surgeon: Anner Alm ORN, MD;  Location: [facility] INVASIVE CV LAB;  Service:  Cardiovascular;  Laterality: N/A;   MALONEY DILATION N/A 02/05/2021   Procedure: AGAPITO DILATION;  Surgeon: Shaaron Lamar HERO, MD;  [facility];  Service: Endoscopy;  Laterality: N/A;   NECK SURGERY     NECK SURGERY N/A 2005   metal plate in neck   POLYPECTOMY  05/25/2017   Procedure: POLYPECTOMY;  Surgeon: Shaaron Lamar HERO, MD;  [facility];  Service: Endoscopy;;  Sigmoid colon (CS)   TOTAL KNEE ARTHROPLASTY Left 02/22/2013   Procedure: LEFT TOTAL KNEE  ARTHROPLASTY;  Surgeon: LELON JONETTA Shari Mickey., MD;  Location: [facility];  Service: Orthopedics;  Laterality: Left;    Prior to Admission medications   Medication Sig Start Date End Date Taking? Authorizing Provider  amLODipine  (NORVASC ) 2.5 MG tablet TAKE ONE (1) TABLET BY MOUTH EVERY DAY 07/05/22  Yes Delford Maude BROCKS, MD  aspirin  EC 81 MG tablet Take 81 mg by mouth daily. Swallow whole.   Yes [provider]  cholecalciferol (VITAMIN D3) 25 MCG (1000 UNIT) tablet Take 1,000 Units by mouth daily.   Yes [provider]  cyclobenzaprine (FLEXERIL) 10 MG tablet Take 10 mg by mouth 3 (three) times daily as needed for muscle spasms.   Yes [provider]  diphenhydrAMINE  (BENADRYL  ALLERGY) 25 mg capsule Take 25 mg by mouth every 6 (six) hours as needed for itching.   Yes [provider]  HUMIRA , 2 PEN, 40 MG/0.4ML pen INJECT 1 PEN UNDER THE SKIN EVERY 14 DAYS (ON WEDNESDAYS) 11/02/22  Yes Aslynn Brunetti, Lamar HERO, MD  hydrochlorothiazide (MICROZIDE) 12.5 MG capsule Take 12.5 mg by mouth daily.   Yes [provider]  lisinopril (PRINIVIL,ZESTRIL) 10 MG tablet Take 10 mg by mouth daily.  03/23/17  Yes [provider]  Loratadine 10 MG CAPS Take 10 mg by mouth daily.   Yes [provider]  metFORMIN (GLUCOPHAGE) 500 MG tablet Take 500 mg by mouth daily with supper.   Yes [provider]  nitroGLYCERIN  (NITROSTAT ) 0.4 MG SL tablet Place 1 tablet (0.4 mg total) under the tongue every 5 (five) minutes as needed for chest pain. 10/14/20 10/03/23 Yes Delford Maude BROCKS, MD  omeprazole  (PRILOSEC) 40 MG capsule TAKE ONE CAPSULE BY MOUTH DAILY 11/02/22  Yes Coraima Tibbs, Lamar HERO, MD  oxyCODONE -acetaminophen  (PERCOCET) 10-325 MG tablet Take 1 tablet by mouth every 6 (six) hours as needed for pain.   Yes [provider]  tamsulosin (FLOMAX) 0.4 MG CAPS capsule Take 0.4 mg by mouth daily after breakfast. 03/28/17  Yes [provider]    Allergies as of  10/03/2023 - Review Complete 10/03/2023  Allergen Reaction Noted   Ketorolac  10/03/2023   Toradol [ketorolac tromethamine] Other (See Comments) 02/25/2020    Family History  Problem Relation Age of Onset   Diabetes Mother    Heart failure Mother    Heart failure Father    Colon polyps Father    Cancer - Lung Father    COPD Sister    Diabetes type II Sister    Hypertension Sister    Sleep apnea Sister    Arthritis Sister    Colitis Sister        Maybe?   Irregular heart beat Brother    Alcoholism Brother    Arthritis Brother    Colon cancer Neg Hx    Inflammatory bowel disease Neg Hx     Social History   Socioeconomic History   Marital status: Married    Spouse name: Not  on file   Number of children: Not on file   Years of education: Not on file   Highest education level: Not on file  Occupational History   Not on file  Tobacco Use   Smoking status: Former    Current packs/day: 0.00    Average packs/day: 2.0 packs/day for 10.0 years (20.0 ttl pk-yrs)    Types: Cigarettes    Start date: 02/12/1977    Quit date: 02/13/1987    Years since quitting: 36.6   Smokeless tobacco: Never  Vaping Use   Vaping status: Never Used  Substance and Sexual Activity   Alcohol use: No   Drug use: No   Sexual activity: Yes    Birth control/protection: None  Other Topics Concern   Not on file  Social History Narrative   Not on file   Social Drivers of Health   Financial Resource Strain: Not on file  Food Insecurity: Not on file  Transportation Needs: Not on file  Physical Activity: Not on file  Stress: Not on file  Social Connections: Unknown (08/24/2021)   [facility]   Social Network    Social Network: Not on file  Intimate Partner Violence: Unknown (07/16/2021)   Received from [facility]   HITS    Physically Hurt: Not on file    Insult or Talk Down To: Not on file    Threaten Physical Harm: Not on file    Scream or Curse: Not on file    Review  of Systems: See HPI, otherwise negative ROS  Physical Exam: BP 138/76 (BP Location: Right Arm, Patient Position: Sitting, Cuff Size: Normal) Comment (BP Location): taken on lower right arm  Pulse (!) 59   Temp 98.2 F (36.8 C) (Oral)   Ht 6' (1.829 m)   Wt (!) 309 lb (140.2 kg)   SpO2 96%   BMI 41.91 kg/m  General:   Alert,  Well-developed, well-nourished, pleasant and cooperative in NAD Neck:  Supple; no masses or thyromegaly. No significant cervical adenopathy. Lungs:  Clear throughout to auscultation.   No wheezes, crackles, or rhonchi. No acute distress. Heart:  Regular rate and rhythm; no murmurs, clicks, rubs,  or gallops. Abdomen: Obese.  Non-distended, normal bowel sounds.  Soft and nontender without appreciable mass or hepatosplenomegaly.  Skin he has a erythematous rash with a scaly tip in his sacral area.  Appears to be psoriasis to me.  Impression/Plan: 63 year old morbidly is gentleman with at least a 15-year history of ileocolonic Crohn's disease.  He appears to have an excellent clinical remission on Humira .  Biosimilar needed.  Before we switch him out I would like to assess for deep remission with a updated colonoscopy.  New rash temporally associate with Jardiance as he reports.  He appears to have psoriasis by my assessment.  Recommendations:  Will pursue a surveillance colonoscopy ASA 3 in the near future prior to switching him out to a bio similar.  The risks, benefits, limitations, alternatives and imponderables have been reviewed with the patient. Questions have been answered. All parties are agreeable.    Updated labs as appropriate.  If rash does not resolve he needs to see the dermatologist once again.     Notice: This dictation was prepared with Dragon dictation along with smaller phrase technology. Any transcriptional errors that result from this process are unintentional and may not be corrected upon review.

## 2023-10-03 NOTE — Telephone Encounter (Signed)
 PA submitted via [facility] Case ID# [id number]

## 2023-10-03 NOTE — Progress Notes (Unsigned)
 Primary Care Physician:  Renato Dorothey HERO, NP Primary Gastroenterologist:  Dr. Shaaron  Pre-Procedure History & Physical: HPI:  Ricky Blackwell is a 63 y.o. male here for follow-up of ileocolonic Crohn's colitis.  Started Humira  approximately 3 years ago.  Amazingly has not been back to the office.  His bowel symptoms have resolved.  He has no diarrhea no tenesmus no rectal bleeding no abdominal pain he did have a rash with initial adalimumab  seen by Dr. Shona the dermatologist feel.  Felt not to be related to adalimumab  he changed his laundry detergent and used a hypoallergenic soap and that rash resolved.  Recently started on Jardiance is a pruritic rash now in the groin area and in his back he stopped Jardiance he still has issues.  Insurance is dictated he go to a bio similar he is due for follow-up colonoscopy.  This ought to be done before we switch him to a biosimilar to assess disease activity objectively.  Past Medical History:  Diagnosis Date   Anal stenosis    Anginal pain (HCC)    Arthritis    Colitis    COVID-19 04/2020   Crohn's disease (HCC) 1993   Diabetes mellitus without complication (HCC)    H/O hiatal hernia    History of kidney stones    Hypertension    Irritable bowel syndrome    Pneumonia    hx   Rectal bleeding    Unspecified constipation     Past Surgical History:  Procedure Laterality Date   ABDOMINAL HERNIA REPAIR  2010   BACK SURGERY  2000   BIOPSY  05/21/2020   Procedure: BIOPSY;  Surgeon: Shaaron Lamar HERO, MD;  Location: [facility];  Service: Endoscopy;;   CARDIAC CATHETERIZATION     COLONOSCOPY  05/2004   Dr. [facility]aneGLENWOOD [facility]- crohns    COLONOSCOPY  08/20/2008   Dr. Shaaron- normal distal rectum, proximal rectum, scattered 1-2 mm aphthous ulcers, these extended into the distal sigmoid.   COLONOSCOPY N/A 10/03/2014   Cypher Paule: findings c/w mildly active ileocolonic crohn's with scattered aphthous ulcers in the distal sigmoid colon, descending colon,  ascending colon, terminal ileum. Transverse colon was normal.   COLONOSCOPY WITH PROPOFOL  N/A 05/25/2017   Procedure: COLONOSCOPY WITH PROPOFOL ;  Surgeon: Shaaron Lamar HERO, MD;  scattered ulcers and erythema involving the rectum colon and terminal ileum consistent with Crohn's disease, one benign 4 mm sessile polyp, s/p frequent segmental biopsies.  Pathology with chronic mildly active ileitis and mildly active colitis in ascending colon, sigmoid colon, and rectum.  Repeat in 2 years.   COLONOSCOPY WITH PROPOFOL  N/A 05/21/2020    Surgeon: Shaaron Lamar HERO, MD;  IBD with skipped involvement of the colon endoscopically consistent with Crohn's colitis s/p segmental biopsy.  Normal-appearing distal TI, ulcerated ileocecal valve.  Pathology with mildly active chronic colitis in the ileocecal valve, moderately active chronic colitis in the descending colon, severely active chronic colitis with ulceration in the sigmoid colon.    ESOPHAGOGASTRODUODENOSCOPY (EGD) WITH PROPOFOL  N/A 02/05/2021   Procedure: ESOPHAGOGASTRODUODENOSCOPY (EGD) WITH PROPOFOL ;  Surgeon: Shaaron Lamar HERO, MD;  Location: [facility];  Service: Endoscopy;  Laterality: N/A;  10:45am   JOINT REPLACEMENT Left    knee   KNEE SURGERY Right 2010   Left Arthoscopic   LEFT HEART CATH AND CORONARY ANGIOGRAPHY N/A 10/27/2020   Procedure: LEFT HEART CATH AND CORONARY ANGIOGRAPHY;  Surgeon: Anner Alm ORN, MD;  Location: [facility];  Service: Cardiovascular;  Laterality: N/A;   MALONEY DILATION N/A 02/05/2021   Procedure: AGAPITO DILATION;  Surgeon: Shaaron Lamar HERO, MD;  Location: [facility];  Service: Endoscopy;  Laterality: N/A;   NECK SURGERY     NECK SURGERY N/A 2005   metal plate in neck   POLYPECTOMY  05/25/2017   Procedure: POLYPECTOMY;  Surgeon: Shaaron Lamar HERO, MD;  Location: [facility];  Service: Endoscopy;;  Sigmoid colon (CS)   TOTAL KNEE ARTHROPLASTY Left 02/22/2013   Procedure: LEFT TOTAL KNEE ARTHROPLASTY;   Surgeon: LELON JONETTA Shari Mickey., MD;  Location: [facility];  Service: Orthopedics;  Laterality: Left;    Prior to Admission medications   Medication Sig Start Date End Date Taking? Authorizing Provider  amLODipine  (NORVASC ) 2.5 MG tablet TAKE ONE (1) TABLET BY MOUTH EVERY DAY 07/05/22  Yes Delford Maude BROCKS, MD  aspirin  EC 81 MG tablet Take 81 mg by mouth daily. Swallow whole.   Yes [provider]  cholecalciferol (VITAMIN D3) 25 MCG (1000 UNIT) tablet Take 1,000 Units by mouth daily.   Yes [provider]  cyclobenzaprine (FLEXERIL) 10 MG tablet Take 10 mg by mouth 3 (three) times daily as needed for muscle spasms.   Yes [provider]  diphenhydrAMINE  (BENADRYL  ALLERGY) 25 mg capsule Take 25 mg by mouth every 6 (six) hours as needed for itching.   Yes [provider]  HUMIRA , 2 PEN, 40 MG/0.4ML pen INJECT 1 PEN UNDER THE SKIN EVERY 14 DAYS (ON WEDNESDAYS) 11/02/22  Yes Rosibel Giacobbe, Lamar HERO, MD  hydrochlorothiazide (MICROZIDE) 12.5 MG capsule Take 12.5 mg by mouth daily.   Yes [provider]  lisinopril (PRINIVIL,ZESTRIL) 10 MG tablet Take 10 mg by mouth daily.  03/23/17  Yes [provider]  Loratadine 10 MG CAPS Take 10 mg by mouth daily.   Yes [provider]  metFORMIN (GLUCOPHAGE) 500 MG tablet Take 500 mg by mouth daily with supper.   Yes [provider]  nitroGLYCERIN  (NITROSTAT ) 0.4 MG SL tablet Place 1 tablet (0.4 mg total) under the tongue every 5 (five) minutes as needed for chest pain. 10/14/20 10/03/23 Yes Delford Maude BROCKS, MD  omeprazole  (PRILOSEC) 40 MG capsule TAKE ONE CAPSULE BY MOUTH DAILY 11/02/22  Yes Teresita Fanton, Lamar HERO, MD  oxyCODONE -acetaminophen  (PERCOCET) 10-325 MG tablet Take 1 tablet by mouth every 6 (six) hours as needed for pain.   Yes [provider]  tamsulosin (FLOMAX) 0.4 MG CAPS capsule Take 0.4 mg by mouth daily after breakfast. 03/28/17  Yes [provider]    Allergies as of 10/03/2023 - Review  Complete 10/03/2023  Allergen Reaction Noted   Ketorolac  10/03/2023   Toradol [ketorolac tromethamine] Other (See Comments) 02/25/2020    Family History  Problem Relation Age of Onset   Diabetes Mother    Heart failure Mother    Heart failure Father    Colon polyps Father    Cancer - Lung Father    COPD Sister    Diabetes type II Sister    Hypertension Sister    Sleep apnea Sister    Arthritis Sister    Colitis Sister        Maybe?   Irregular heart beat Brother    Alcoholism Brother    Arthritis Brother    Colon cancer Neg Hx    Inflammatory bowel disease Neg Hx     Social History   Socioeconomic History   Marital status: Married    Spouse name: Not on file  Number of children: Not on file   Years of education: Not on file   Highest education level: Not on file  Occupational History   Not on file  Tobacco Use   Smoking status: Former    Current packs/day: 0.00    Average packs/day: 2.0 packs/day for 10.0 years (20.0 ttl pk-yrs)    Types: Cigarettes    Start date: 02/12/1977    Quit date: 02/13/1987    Years since quitting: 36.6   Smokeless tobacco: Never  Vaping Use   Vaping status: Never Used  Substance and Sexual Activity   Alcohol use: No   Drug use: No   Sexual activity: Yes    Birth control/protection: None  Other Topics Concern   Not on file  Social History Narrative   Not on file   Social Drivers of Health   Financial Resource Strain: Not on file  Food Insecurity: Not on file  Transportation Needs: Not on file  Physical Activity: Not on file  Stress: Not on file  Social Connections: Unknown (08/24/2021)   Received from [facility]   Social Network    Social Network: Not on file  Intimate Partner Violence: Unknown (07/16/2021)   Received from [facility]   HITS    Physically Hurt: Not on file    Insult or Talk Down To: Not on file    Threaten Physical Harm: Not on file    Scream or Curse: Not on file    Review of Systems: See HPI,  otherwise negative ROS  Physical Exam: BP 138/76 (BP Location: Right Arm, Patient Position: Sitting, Cuff Size: Normal) Comment (BP Location): taken on lower right arm  Pulse (!) 59   Temp 98.2 F (36.8 C) (Oral)   Ht 6' (1.829 m)   Wt (!) 309 lb (140.2 kg)   SpO2 96%   BMI 41.91 kg/m  General:   Alert,  Well-developed, well-nourished, pleasant and cooperative in NAD Neck:  Supple; no masses or thyromegaly. No significant cervical adenopathy. Lungs:  Clear throughout to auscultation.   No wheezes, crackles, or rhonchi. No acute distress. Heart:  Regular rate and rhythm; no murmurs, clicks, rubs,  or gallops. Abdomen: Obese.  Non-distended, normal bowel sounds.  Soft and nontender without appreciable mass or hepatosplenomegaly.  Skin he has a erythematous rash with a scaly tip in his sacral area.  Appears to be psoriasis to me.  Impression/Plan: 63 year old morbidly is gentleman with at least a 15-year history of ileocolonic Crohn's disease.  He appears to have an excellent clinical remission on Humira .  Biosimilar needed.  Before we switch him out I would like to assess for deep remission with a updated colonoscopy.  New rash temporally associate with Jardiance as he reports.  He appears to have psoriasis by my assessment.  Recommendations:  Will pursue a surveillance colonoscopy ASA 3 in the near future prior to switching him out to a bio similar  Updated labs as appropriate.  If rash does not resolve he needs to see the dermatologist once again.     Notice: This dictation was prepared with Dragon dictation along with smaller phrase technology. Any transcriptional errors that result from this process are unintentional and may not be corrected upon review.

## 2023-10-04 ENCOUNTER — Telehealth: Payer: Self-pay | Admitting: *Deleted

## 2023-10-04 NOTE — Telephone Encounter (Signed)
 Spoke with pt. Dr. Shaaron had a cancellation 6/30 at 10am. He was agreeable to move procedure up. Advised will let the hospital know and he is going to call once he gets home to update his instructions date/times.

## 2023-10-04 NOTE — Telephone Encounter (Signed)
 Pt called and went over in detail his updated instructions for his procedure on 10/09/23. Informed pt that he will receive a telephone call on 10/06/23 from pre-op nurse and she will let him know what time to arrive on morning of procedure. Pt verbalized understanding.

## 2023-10-04 NOTE — Telephone Encounter (Signed)
 Per [facility] website No Auth Required

## 2023-10-06 ENCOUNTER — Other Ambulatory Visit: Payer: Self-pay

## 2023-10-06 ENCOUNTER — Encounter (HOSPITAL_COMMUNITY): Payer: Self-pay

## 2023-10-06 ENCOUNTER — Encounter (HOSPITAL_COMMUNITY)
Admission: RE | Admit: 2023-10-06 | Discharge: 2023-10-06 | Disposition: A | Discharge status: Home / Self Care | Source: Ambulatory Visit | Attending: Internal Medicine | Admitting: Internal Medicine

## 2023-10-06 VITALS — Ht 72.0 in | Wt 309.0 lb

## 2023-10-06 DIAGNOSIS — I10 Essential (primary) hypertension: Secondary | ICD-10-CM

## 2023-10-06 DIAGNOSIS — E119 Type 2 diabetes mellitus without complications: Secondary | ICD-10-CM

## 2023-10-09 ENCOUNTER — Ambulatory Visit (HOSPITAL_COMMUNITY)
Admission: RE | Admit: 2023-10-09 | Discharge: 2023-10-09 | Disposition: A | Discharge status: Home / Self Care | Attending: Internal Medicine | Admitting: Internal Medicine

## 2023-10-09 ENCOUNTER — Ambulatory Visit (HOSPITAL_COMMUNITY)

## 2023-10-09 ENCOUNTER — Encounter (HOSPITAL_COMMUNITY)
Admission: RE | Disposition: A | Discharge status: Home / Self Care | Payer: Self-pay | Source: Home / Self Care | Attending: Internal Medicine

## 2023-10-09 ENCOUNTER — Encounter (HOSPITAL_COMMUNITY): Payer: Self-pay | Admitting: Internal Medicine

## 2023-10-09 ENCOUNTER — Ambulatory Visit (HOSPITAL_BASED_OUTPATIENT_CLINIC_OR_DEPARTMENT_OTHER)

## 2023-10-09 ENCOUNTER — Other Ambulatory Visit: Payer: Self-pay | Admitting: Internal Medicine

## 2023-10-09 DIAGNOSIS — Z1211 Encounter for screening for malignant neoplasm of colon: Secondary | ICD-10-CM | POA: Insufficient documentation

## 2023-10-09 DIAGNOSIS — K5140 Inflammatory polyps of colon without complications: Secondary | ICD-10-CM | POA: Diagnosis not present

## 2023-10-09 DIAGNOSIS — K5010 Crohn's disease of large intestine without complications: Secondary | ICD-10-CM

## 2023-10-09 DIAGNOSIS — Z833 Family history of diabetes mellitus: Secondary | ICD-10-CM | POA: Insufficient documentation

## 2023-10-09 DIAGNOSIS — Z8249 Family history of ischemic heart disease and other diseases of the circulatory system: Secondary | ICD-10-CM | POA: Insufficient documentation

## 2023-10-09 DIAGNOSIS — I209 Angina pectoris, unspecified: Secondary | ICD-10-CM | POA: Diagnosis not present

## 2023-10-09 DIAGNOSIS — Z83719 Family history of colon polyps, unspecified: Secondary | ICD-10-CM | POA: Diagnosis not present

## 2023-10-09 DIAGNOSIS — L539 Erythematous condition, unspecified: Secondary | ICD-10-CM | POA: Diagnosis not present

## 2023-10-09 DIAGNOSIS — K6389 Other specified diseases of intestine: Secondary | ICD-10-CM | POA: Diagnosis not present

## 2023-10-09 DIAGNOSIS — K529 Noninfective gastroenteritis and colitis, unspecified: Secondary | ICD-10-CM | POA: Diagnosis not present

## 2023-10-09 DIAGNOSIS — E119 Type 2 diabetes mellitus without complications: Secondary | ICD-10-CM | POA: Insufficient documentation

## 2023-10-09 DIAGNOSIS — K5730 Diverticulosis of large intestine without perforation or abscess without bleeding: Secondary | ICD-10-CM | POA: Diagnosis not present

## 2023-10-09 DIAGNOSIS — K589 Irritable bowel syndrome without diarrhea: Secondary | ICD-10-CM | POA: Insufficient documentation

## 2023-10-09 DIAGNOSIS — M1990 Unspecified osteoarthritis, unspecified site: Secondary | ICD-10-CM | POA: Diagnosis not present

## 2023-10-09 DIAGNOSIS — Z87891 Personal history of nicotine dependence: Secondary | ICD-10-CM | POA: Diagnosis not present

## 2023-10-09 DIAGNOSIS — Z79620 Long term (current) use of immunosuppressive biologic: Secondary | ICD-10-CM | POA: Diagnosis not present

## 2023-10-09 DIAGNOSIS — K5090 Crohn's disease, unspecified, without complications: Secondary | ICD-10-CM | POA: Diagnosis present

## 2023-10-09 DIAGNOSIS — K624 Stenosis of anus and rectum: Secondary | ICD-10-CM | POA: Diagnosis not present

## 2023-10-09 DIAGNOSIS — K449 Diaphragmatic hernia without obstruction or gangrene: Secondary | ICD-10-CM | POA: Diagnosis not present

## 2023-10-09 DIAGNOSIS — K219 Gastro-esophageal reflux disease without esophagitis: Secondary | ICD-10-CM | POA: Diagnosis not present

## 2023-10-09 DIAGNOSIS — Z8261 Family history of arthritis: Secondary | ICD-10-CM | POA: Diagnosis not present

## 2023-10-09 DIAGNOSIS — I10 Essential (primary) hypertension: Secondary | ICD-10-CM | POA: Insufficient documentation

## 2023-10-09 HISTORY — PX: COLONOSCOPY: SHX5424

## 2023-10-09 LAB — BASIC METABOLIC PANEL WITH GFR
Anion gap: 9 (ref 5–15)
BUN: 11 mg/dL (ref 8–23)
CO2: 26 mmol/L (ref 22–32)
Calcium: 8.8 mg/dL — ABNORMAL LOW (ref 8.9–10.3)
Chloride: 103 mmol/L (ref 98–111)
Creatinine, Ser: 0.92 mg/dL (ref 0.61–1.24)
GFR, Estimated: >60 mL/min (ref >60)
Glucose, Bld: 118 mg/dL — ABNORMAL HIGH (ref 70–99)
Potassium: 3.9 mmol/L (ref 3.5–5.1)
Sodium: 138 mmol/L (ref 135–145)

## 2023-10-09 LAB — GLUCOSE, CAPILLARY: Glucose-Capillary: 103 mg/dL — ABNORMAL HIGH (ref 70–99)

## 2023-10-09 SURGERY — COLONOSCOPY
Anesthesia: General

## 2023-10-09 MED ORDER — LACTATED RINGERS IV SOLN: INTRAVENOUS | Status: DC | PRN

## 2023-10-09 MED ORDER — EPHEDRINE SULFATE (PRESSORS) 50 MG/ML IJ SOLN
INTRAMUSCULAR | Status: DC | PRN
  Administered 2023-10-09: 15 mg via INTRAVENOUS

## 2023-10-09 MED ORDER — LIDOCAINE 2% (20 MG/ML) 5 ML SYRINGE
INTRAMUSCULAR | Status: AC
  Filled 2023-10-09: qty 30

## 2023-10-09 MED ORDER — PROPOFOL 10 MG/ML IV BOLUS
INTRAVENOUS | Status: DC | PRN
  Administered 2023-10-09: 100 mg via INTRAVENOUS

## 2023-10-09 MED ORDER — LIDOCAINE HCL (CARDIAC) PF 100 MG/5ML IV SOSY
PREFILLED_SYRINGE | INTRAVENOUS | Status: DC | PRN
Start: 2023-10-09 — End: 2023-10-09
  Administered 2023-10-09: 60 mg via INTRATRACHEAL

## 2023-10-09 MED ORDER — PHENYLEPHRINE 80 MCG/ML (10ML) SYRINGE FOR IV PUSH (FOR BLOOD PRESSURE SUPPORT)
PREFILLED_SYRINGE | INTRAVENOUS | Status: AC
  Filled 2023-10-09: qty 20

## 2023-10-09 MED ORDER — EPHEDRINE 5 MG/ML INJ
INTRAVENOUS | Status: AC
  Filled 2023-10-09: qty 10

## 2023-10-09 MED ORDER — LACTATED RINGERS IV SOLN: INTRAVENOUS | Status: DC

## 2023-10-09 MED ORDER — PROPOFOL 500 MG/50ML IV EMUL
INTRAVENOUS | Status: DC | PRN
  Administered 2023-10-09: 200 ug/kg/min via INTRAVENOUS

## 2023-10-09 NOTE — Addendum Note (Signed)
 Addendum  created 10/09/23 1054 by Herschell Hollering, MD   Clinical Note Signed

## 2023-10-09 NOTE — Interval H&P Note (Signed)
 History and Physical Interval Note:  10/09/2023 9:58 AM  Ricky Blackwell  has presented today for surgery, with the diagnosis of crohns colitis.  The various methods of treatment have been discussed with the patient and family. After consideration of risks, benefits and other options for treatment, the patient has consented to  Procedure(s) with comments: COLONOSCOPY (N/A) - 230pm, ok rm 1-2 as a surgical intervention.  The patient's history has been reviewed, patient examined, no change in status, stable for surgery.  I have reviewed the patient's chart and labs.  Questions were answered to the patient's satisfaction.     Ricky Blackwell  No change in condition.  Patient notes a rash in his groin he wonders if he needs to see the dermatologist back again.  Otherwise, I have offered him an ileocolonoscopy today per plan. The risks, benefits, limitations, alternatives and imponderables have been reviewed with the patient. Questions have been answered. All parties are agreeable.

## 2023-10-09 NOTE — Op Note (Signed)
 [facility] Patient Name: Ricky Blackwell Procedure Date: 10/09/2023 9:33 AM MRN: [id number] Date of Birth: 04-08-1961 Attending MD: Lamar Ozell Hollingshead , MD, [id number] CSN: [id number] Age: 63 Admit Type: Outpatient Procedure:                Colonoscopy Indications:              High risk colon cancer surveillance: Crohn's                            colitis of 8 (or more) years duration with                            one-third (or more) of the colon involved Providers:                Lamar Ozell Hollingshead, MD, Jon LABOR. Gerome RN, RN,                            Italy Wilson, Technician Referring MD:              Medicines:                Propofol  per Anesthesia Complications:            No immediate complications. Estimated Blood Loss:     Estimated blood loss was minimal. Procedure:                Pre-Anesthesia Assessment:                           - Prior to the procedure, a History and Physical                            was performed, and patient medications and                            allergies were reviewed. The patient's tolerance of                            previous anesthesia was also reviewed. The risks                            and benefits of the procedure and the sedation                            options and risks were discussed with the patient.                            All questions were answered, and informed consent                            was obtained. Prior Anticoagulants: The patient has                            taken no anticoagulant or antiplatelet agents. ASA  Grade Assessment: III - A patient with severe                            systemic disease. After reviewing the risks and                            benefits, the patient was deemed in satisfactory                            condition to undergo the procedure.                           After obtaining informed consent, the colonoscope                             was passed under direct vision. Throughout the                            procedure, the patient's blood pressure, pulse, and                            oxygen saturations were monitored continuously. The                            [id number]) scope was introduced through the                            anus and advanced to the 5 cm into the ileum. The                            colonoscopy was performed without difficulty. The                            patient tolerated the procedure well. The quality                            of the bowel preparation was adequate. The terminal                            ileum, ileocecal valve, appendiceal orifice, and                            rectum were photographed. Scope In: 10:09:39 AM Scope Out: 10:26:17 AM Scope Withdrawal Time: 0 hours 12 minutes 52 seconds  Total Procedure Duration: 0 hours 16 minutes 38 seconds  Findings:      moderate anal stenosis present. The transverse descending sigmoid and       rectal mucosa appeared normal aside from the scattered diverticula.       Distal 5 cm of terminal ileum appeared normal. (1) 5 mm at the ileocecal       valve.. polyp was cold biopsy removed. Segment of biopsies throughout       the colon were taken for histologic study. Scattered colonic diverticula. Impression:               -  Normal ileum. Diffusely abnormal ascending colon                            consistent with focal colitis. Remainder of the                            colon and rectum appeared normal aside from                            diverticulosis status post sigmoid biopsy                           - ileocecal valve polyp removed as described above.                            Scattered diverticula.                           Patient has a scaly erythematous rash in his                            coccygeal area extending into his groin. Appears to                            be concerning for psoriasis. He needs to see a                             dermatologist. Endoscopically his IBD is not under                            good control. Before switching him to a biosimilar                            he needs to see the dermatologist if he has                            psoriasis may be better served with a IL 23 agent Moderate Sedation:      Moderate (conscious) sedation was personally administered by an       anesthesia professional. The following parameters were monitored: oxygen       saturation, heart rate, blood pressure, respiratory rate, EKG, adequacy       of pulmonary ventilation, and response to care. Recommendation:           - Patient has a contact number available for                            emergencies. The signs and symptoms of potential                            delayed complications were discussed with the                            patient. Return to normal activities tomorrow.  Written discharge instructions were provided to the                            patient.                           - Advance diet as tolerated. follow-up on                            pathology. Office visit with me in 1 month Procedure Code(s):        --- Professional ---                           [phone], Colonoscopy, flexible; diagnostic, including                            collection of specimen(s) by brushing or washing,                            when performed (separate procedure) Diagnosis Code(s):        --- Professional ---                           K50.10, Crohn's disease of large intestine without                            complications CPT copyright 2022 American Medical Association. All rights reserved. The codes documented in this report are preliminary and upon coder review may  be revised to meet current compliance requirements. Lamar HERO. Stephone Gum, MD Lamar Ozell Hollingshead, MD 10/09/2023 10:40:00 AM This report has been signed electronically. Number of Addenda: 0

## 2023-10-09 NOTE — Anesthesia Postprocedure Evaluation (Signed)
 Anesthesia Post Note  Patient: Ricky Blackwell  Procedure(s) Performed: COLONOSCOPY  Anesthesia Type: General Anesthetic complications: no   No notable events documented.   Last Vitals:  Vitals:   10/09/23 0930 10/09/23 1030  BP:  (!) 112/53  Pulse: (!) 51 66  Resp: 16 15  Temp:  36.6 C  SpO2: 94% 100%    Last Pain:  Vitals:   10/09/23 1030  TempSrc: Oral  PainSc: 0-No pain                 Andrea Limes

## 2023-10-09 NOTE — Transfer of Care (Signed)
 Immediate Anesthesia Transfer of Care Note  Patient: Ricky Blackwell  Procedure(s) Performed: COLONOSCOPY  Patient Location: Endoscopy Unit  Anesthesia Type:General  Level of Consciousness: awake, alert , and oriented  Airway & Oxygen Therapy: Patient Spontanous Breathing  Post-op Assessment: Report given to RN and Post -op Vital signs reviewed and stable  Post vital signs: Reviewed and stable  Last Vitals:  Vitals Value Taken Time  BP 112/53 10/09/23 10:30  Temp 36.6 C 10/09/23 10:30  Pulse 66 10/09/23 10:30  Resp 15 10/09/23 10:30  SpO2 100 % 10/09/23 10:30    Last Pain:  Vitals:   10/09/23 1030  TempSrc: Oral  PainSc: 0-No pain         Complications: No notable events documented.

## 2023-10-09 NOTE — Anesthesia Postprocedure Evaluation (Signed)
 Anesthesia Post Note  Patient: Ricky Blackwell  Procedure(s) Performed: COLONOSCOPY  Patient location during evaluation: PACU Anesthesia Type: General Level of consciousness: awake and alert Pain management: pain level controlled Vital Signs Assessment: post-procedure vital signs reviewed and stable Respiratory status: spontaneous breathing, nonlabored ventilation, respiratory function stable and patient connected to nasal cannula oxygen Cardiovascular status: stable and blood pressure returned to baseline Postop Assessment: no apparent nausea or vomiting Anesthetic complications: no  No notable events documented.   Last Vitals:  Vitals:   10/09/23 0930 10/09/23 1030  BP:  (!) 112/53  Pulse: (!) 51 66  Resp: 16 15  Temp:  36.6 C  SpO2: 94% 100%    Last Pain:  Vitals:   10/09/23 1030  TempSrc: Oral  PainSc: 0-No pain                 Andrea Limes

## 2023-10-09 NOTE — Anesthesia Preprocedure Evaluation (Addendum)
 Anesthesia Evaluation  Patient identified by MRN, date of birth, ID band Patient awake    Reviewed: Allergy & Precautions, H&P , NPO status , Patient's Chart, lab work & pertinent test results  Airway Mallampati: II  TM Distance: >3 FB Neck ROM: Full    Dental  (+) Partial Upper   Pulmonary pneumonia, former smoker   Pulmonary exam normal breath sounds clear to auscultation       Cardiovascular hypertension, + angina  Normal cardiovascular exam Rhythm:Regular Rate:Normal     Neuro/Psych negative neurological ROS  negative psych ROS   GI/Hepatic Neg liver ROS, hiatal hernia,GERD  ,,  Endo/Other  diabetes    Renal/GU negative Renal ROS  negative genitourinary   Musculoskeletal  (+) Arthritis ,    Abdominal   Peds negative pediatric ROS (+)  Hematology negative hematology ROS (+)   Anesthesia Other Findings   Reproductive/Obstetrics negative OB ROS                             Anesthesia Physical Anesthesia Plan  ASA: 2  Anesthesia Plan: General   Post-op Pain Management:    Induction: Intravenous  PONV Risk Score and Plan: Propofol  infusion  Airway Management Planned: Nasal Cannula  Additional Equipment:   Intra-op Plan:   Post-operative Plan: Extubation in OR  Informed Consent: I have reviewed the patients History and Physical, chart, labs and discussed the procedure including the risks, benefits and alternatives for the proposed anesthesia with the patient or authorized representative who has indicated his/her understanding and acceptance.       Plan Discussed with: CRNA  Anesthesia Plan Comments:        Anesthesia Quick Evaluation

## 2023-10-09 NOTE — Discharge Instructions (Signed)
   Colonoscopy Discharge Instructions  Read the instructions outlined below and refer to this sheet in the next few weeks. These discharge instructions provide you with general information on caring for yourself after you leave the hospital. Your doctor may also give you specific instructions. While your treatment has been planned according to the most current medical practices available, unavoidable complications occasionally occur. If you have any problems or questions after discharge, call Dr. Shaaron at [phone]. ACTIVITY You may resume your regular activity, but move at a slower pace for the next 24 hours.  Take frequent rest periods for the next 24 hours.  Walking will help get rid of the air and reduce the bloated feeling in your belly (abdomen).  No driving for 24 hours (because of the medicine (anesthesia) used during the test).   Do not sign any important legal documents or operate any machinery for 24 hours (because of the anesthesia used during the test).  NUTRITION Drink plenty of fluids.  You may resume your normal diet as instructed by your doctor.  Begin with a light meal and progress to your normal diet. Heavy or fried foods are harder to digest and may make you feel sick to your stomach (nauseated).  Avoid alcoholic beverages for 24 hours or as instructed.  MEDICATIONS You may resume your normal medications unless your doctor tells you otherwise.  WHAT YOU CAN EXPECT TODAY Some feelings of bloating in the abdomen.  Passage of more gas than usual.  Spotting of blood in your stool or on the toilet paper.  IF YOU HAD POLYPS REMOVED DURING THE COLONOSCOPY: No aspirin  products for 7 days or as instructed.  No alcohol for 7 days or as instructed.  Eat a soft diet for the next 24 hours.  FINDING OUT THE RESULTS OF YOUR TEST Not all test results are available during your visit. If your test results are not back during the visit, make an appointment with your caregiver to find out  the results. Do not assume everything is normal if you have not heard from your caregiver or the medical facility. It is important for you to follow up on all of your test results.  SEEK IMMEDIATE MEDICAL ATTENTION IF: You have more than a spotting of blood in your stool.  Your belly is swollen (abdominal distention).  You are nauseated or vomiting.  You have a temperature over 101.  You have abdominal pain or discomfort that is severe or gets worse throughout the day.     Your colitis appears to be active on the right side.  Biopsies taken.  You had 1 polyp in your colon which was found and removed   you do have a rash in your groin and over your tailbone.  You need to see Dr. Shona the dermatologist as soon as possible.  If he says it is psoriasis, that could impact which medication we start for your colitis.    Your colitis appears not to be under optimal control  Office visit with me in 1 month

## 2023-10-10 ENCOUNTER — Ambulatory Visit: Payer: Self-pay | Admitting: Internal Medicine

## 2023-10-10 LAB — SURGICAL PATHOLOGY

## 2023-10-11 ENCOUNTER — Other Ambulatory Visit: Payer: Self-pay | Admitting: *Deleted

## 2023-10-11 ENCOUNTER — Encounter (HOSPITAL_COMMUNITY): Payer: Self-pay | Admitting: Internal Medicine

## 2023-10-11 DIAGNOSIS — L989 Disorder of the skin and subcutaneous tissue, unspecified: Secondary | ICD-10-CM

## 2023-10-11 NOTE — Addendum Note (Signed)
 Addended by: GAYLENE MADELIN CROME on: 10/11/2023 11:04 AM   Modules accepted: Orders

## 2023-10-16 ENCOUNTER — Ambulatory Visit: Admitting: Physician Assistant

## 2023-10-16 ENCOUNTER — Encounter: Payer: Self-pay | Admitting: Physician Assistant

## 2023-10-16 VITALS — BP 137/82 | HR 71

## 2023-10-16 DIAGNOSIS — D4819 Other specified neoplasm of uncertain behavior of connective and other soft tissue: Secondary | ICD-10-CM | POA: Diagnosis not present

## 2023-10-16 DIAGNOSIS — R21 Rash and other nonspecific skin eruption: Secondary | ICD-10-CM

## 2023-10-16 NOTE — Progress Notes (Addendum)
   New Patient Visit   Subjective  Ricky Blackwell is a 63 y.o. male who presents for the following: rash  Pt had his colonoscopy last week and GI noticed a rash in groin crease and buttock and told him to follow up with us . Ongoing now x several months. Is using a prescription powder. Denies history of psoriasis. Is on Humira  for Crohn's disease.    The following portions of the chart were reviewed this encounter and updated as appropriate: medications, allergies, medical history  Review of Systems:  No other skin or systemic complaints except as noted in HPI or Assessment and Plan.  Objective  Well appearing patient in no apparent distress; mood and affect are within normal limits.  A focused examination was performed of the following areas: Scalp, face, axillae, chest, back, buttocks, and genitalia.  Relevant exam findings are noted in the Assessment and Plan.       Right Thigh - Anterior Erythematous scaly patches evolving entire genitalia and buttock  Assessment & Plan     RASH AND OTHER NONSPECIFIC SKIN ERUPTION Right Thigh - Anterior Skin / nail biopsy - Right Thigh - Anterior Type of biopsy: punch   Informed consent: discussed and consent obtained   Timeout: patient name, date of birth, surgical site, and procedure verified   Procedure prep:  Patient was prepped and draped in usual sterile fashion (the patient was cleaned and prepped) Prep type:  Isopropyl alcohol Anesthesia: the lesion was anesthetized in a standard fashion   Anesthetic:  1% lidocaine  w/ epinephrine  1-100,000 buffered w/ 8.4% NaHCO3 Punch size:  4 mm Suture size:  4-0 Suture type: nylon   Hemostasis achieved with: suture, pressure and aluminum chloride   Outcome: patient tolerated procedure well   Post-procedure details: sterile dressing applied and wound care instructions given   Dressing type: bandage, petrolatum and pressure dressing    Specimen 1 - Surgical pathology Differential  Diagnosis: inverse psoriasis vs candidiasis vs tinea  Check Margins: No  Return if symptoms worsen or fail to improve, for pending path.  I, Darice Smock, CMA, am acting as scribe for [employer], PA-C.   Documentation: I have reviewed the above documentation for accuracy and completeness, and I agree with the above.  Analaya Hoey K, PA-C

## 2023-10-16 NOTE — Patient Instructions (Addendum)

## 2023-10-20 LAB — SURGICAL PATHOLOGY

## 2023-10-24 ENCOUNTER — Ambulatory Visit: Admitting: Physician Assistant

## 2023-10-25 ENCOUNTER — Other Ambulatory Visit: Payer: Self-pay | Admitting: Physician Assistant

## 2023-10-25 ENCOUNTER — Other Ambulatory Visit: Payer: Self-pay | Admitting: Internal Medicine

## 2023-10-25 ENCOUNTER — Ambulatory Visit: Admitting: Physician Assistant

## 2023-10-25 VITALS — BP 114/69

## 2023-10-25 DIAGNOSIS — K5090 Crohn's disease, unspecified, without complications: Secondary | ICD-10-CM

## 2023-10-25 DIAGNOSIS — K219 Gastro-esophageal reflux disease without esophagitis: Secondary | ICD-10-CM

## 2023-10-25 DIAGNOSIS — L308 Other specified dermatitis: Secondary | ICD-10-CM | POA: Diagnosis not present

## 2023-10-25 DIAGNOSIS — R21 Rash and other nonspecific skin eruption: Secondary | ICD-10-CM | POA: Diagnosis not present

## 2023-10-25 DIAGNOSIS — R1310 Dysphagia, unspecified: Secondary | ICD-10-CM

## 2023-10-25 LAB — POCT SKIN KOH

## 2023-10-25 MED ORDER — FLUCONAZOLE 200 MG PO TABS: 200.0000 mg | ORAL_TABLET | ORAL | 0 refills | Status: AC

## 2023-10-25 MED ORDER — TRIAMCINOLONE ACETONIDE 0.1 % EX OINT: 1.0000 | TOPICAL_OINTMENT | Freq: Two times a day (BID) | CUTANEOUS | 0 refills | Status: DC

## 2023-10-25 MED ORDER — CLOTRIMAZOLE-BETAMETHASONE 1-0.05 % EX CREA: 1.0000 | TOPICAL_CREAM | Freq: Two times a day (BID) | CUTANEOUS | 1 refills | Status: AC

## 2023-10-25 NOTE — Patient Instructions (Signed)

## 2023-10-25 NOTE — Progress Notes (Signed)
   Follow-Up Visit   Subjective  Ricky Blackwell is a 63 y.o. 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 This did not match up with the clinical impression and he is here for repeat biopsy. Rash is very itchy but less than it was.   Patient  has known Crohn's disease and is on Humira . His GI MD referred him to us  to rule out psoriasis as he may switch his medication as it is no longer working for his Crohn's.    The following portions of the chart were reviewed this encounter and updated as appropriate: medications, allergies, medical history  Review of Systems:  No other skin or systemic complaints except as noted in HPI or Assessment and Plan.  Objective  Well appearing patient in no apparent distress; mood and affect are within normal limits.   A focused examination was performed of the following areas: Groin area, buttocks  Relevant exam findings are noted in the Assessment and Plan.         KOH + for hyphae (buttocks).     Assessment & Plan   DERMATITIS UNSPECIFIED:   - Still is unclear as to what is going on but it appears as though it is improving slightly (see comparison images). KOH + today (buttocks). Could also have a component of candidiasis +/- contact dermatitis. Second biopsy performed today (scrotum). Doubt psoriasis.  - Plan as below.   RASH Buttock, scrotum Start fluconazole  200 mg 1 tablet weekly for 3 weeks, Clotrimazole -betamethasone  cream twice daily to groin area and buttocks Skin / nail biopsy - scrotum Type of biopsy: punch   Informed consent: discussed and consent obtained   Timeout: patient name, date of birth, surgical site, and procedure verified   Procedure prep:  Patient was prepped and draped in usual sterile fashion (the patient was cleaned and prepped) Prep type:  Isopropyl alcohol Anesthesia: the lesion was anesthetized in a standard fashion   Anesthetic:   1% lidocaine  w/ epinephrine  1-100,000 buffered w/ 8.4% NaHCO3 Punch size:  4 mm Hemostasis achieved with: Gelfoam   Outcome: patient tolerated procedure well   Post-procedure details: sterile dressing applied and wound care instructions given   Dressing type: bandage, petrolatum and pressure dressing    Aerobic Culture - scrotum  POCT Skin KOH - Buttock Specimen 1 - Surgical pathology Differential Diagnosis: Allergic contact dermatitis vs Psoriasis vs Fungal vs Bacertial  Check Margins: No  Prior biopsy accession # [id number]  Return in about 3 weeks (around 11/15/2023).  I, Roseline Hutchinson, CMA, am acting as scribe for Latona Krichbaum K, PA-C .   Documentation: I have reviewed the above documentation for accuracy and completeness, and I agree with the above.  Harlon Kutner K, PA-C

## 2023-10-25 NOTE — Telephone Encounter (Signed)
 Rx sent in to the pt's pharmacy. Pt aware

## 2023-10-27 LAB — SURGICAL PATHOLOGY

## 2023-10-29 ENCOUNTER — Ambulatory Visit: Payer: Self-pay | Admitting: Physician Assistant

## 2023-10-30 ENCOUNTER — Other Ambulatory Visit: Payer: Self-pay | Admitting: Physician Assistant

## 2023-10-30 ENCOUNTER — Ambulatory Visit: Payer: Self-pay | Admitting: Physician Assistant

## 2023-10-30 DIAGNOSIS — Z22322 Carrier or suspected carrier of Methicillin resistant Staphylococcus aureus: Secondary | ICD-10-CM

## 2023-10-30 LAB — AEROBIC CULTURE

## 2023-10-30 LAB — SPECIMEN STATUS REPORT

## 2023-10-30 MED ORDER — SULFAMETHOXAZOLE-TRIMETHOPRIM 800-160 MG PO TABS: 1.0000 | ORAL_TABLET | Freq: Two times a day (BID) | ORAL | 0 refills | Status: AC

## 2023-10-30 NOTE — Progress Notes (Signed)
 Aerobic culture ++ MRSA   Rx sent in.   Patient notified.

## 2023-11-06 ENCOUNTER — Other Ambulatory Visit: Payer: Self-pay

## 2023-11-06 ENCOUNTER — Emergency Department (HOSPITAL_COMMUNITY)

## 2023-11-06 ENCOUNTER — Encounter (HOSPITAL_COMMUNITY): Payer: Self-pay

## 2023-11-06 ENCOUNTER — Emergency Department (HOSPITAL_COMMUNITY)
Admission: EM | Admit: 2023-11-06 | Discharge: 2023-11-06 | Disposition: A | Discharge status: Home / Self Care | Attending: Emergency Medicine | Admitting: Emergency Medicine

## 2023-11-06 DIAGNOSIS — Z79899 Other long term (current) drug therapy: Secondary | ICD-10-CM | POA: Diagnosis not present

## 2023-11-06 DIAGNOSIS — I10 Essential (primary) hypertension: Secondary | ICD-10-CM | POA: Insufficient documentation

## 2023-11-06 DIAGNOSIS — Z7984 Long term (current) use of oral hypoglycemic drugs: Secondary | ICD-10-CM | POA: Diagnosis not present

## 2023-11-06 DIAGNOSIS — R109 Unspecified abdominal pain: Secondary | ICD-10-CM | POA: Diagnosis present

## 2023-11-06 DIAGNOSIS — M549 Dorsalgia, unspecified: Secondary | ICD-10-CM | POA: Diagnosis not present

## 2023-11-06 DIAGNOSIS — Z7982 Long term (current) use of aspirin: Secondary | ICD-10-CM | POA: Diagnosis not present

## 2023-11-06 DIAGNOSIS — Z8616 Personal history of COVID-19: Secondary | ICD-10-CM | POA: Diagnosis not present

## 2023-11-06 DIAGNOSIS — Z794 Long term (current) use of insulin: Secondary | ICD-10-CM | POA: Diagnosis not present

## 2023-11-06 DIAGNOSIS — E119 Type 2 diabetes mellitus without complications: Secondary | ICD-10-CM | POA: Insufficient documentation

## 2023-11-06 LAB — CBC WITH DIFFERENTIAL/PLATELET
Abs Immature Granulocytes: 0.01 K/uL (ref 0.00–0.07)
Basophils Absolute: 0 K/uL (ref 0.0–0.1)
Basophils Relative: 0 %
Eosinophils Absolute: 0.1 K/uL (ref 0.0–0.5)
Eosinophils Relative: 1 %
HCT: 43 % (ref 39.0–52.0)
Hemoglobin: 14.3 g/dL (ref 13.0–17.0)
Immature Granulocytes: 0 %
Lymphocytes Relative: 36 %
Lymphs Abs: 1.9 K/uL (ref 0.7–4.0)
MCH: 31.2 pg (ref 26.0–34.0)
MCHC: 33.3 g/dL (ref 30.0–36.0)
MCV: 93.7 fL (ref 80.0–100.0)
Monocytes Absolute: 0.5 K/uL (ref 0.1–1.0)
Monocytes Relative: 9 %
Neutro Abs: 2.8 K/uL (ref 1.7–7.7)
Neutrophils Relative %: 54 %
Platelets: 220 K/uL (ref 150–400)
RBC: 4.59 MIL/uL (ref 4.22–5.81)
RDW: 13 % (ref 11.5–15.5)
WBC: 5.3 K/uL (ref 4.0–10.5)
nRBC: 0 % (ref 0.0–0.2)

## 2023-11-06 LAB — URINALYSIS, ROUTINE W REFLEX MICROSCOPIC
Bilirubin Urine: NEGATIVE
Glucose, UA: NEGATIVE mg/dL
Hgb urine dipstick: NEGATIVE
Ketones, ur: NEGATIVE mg/dL
Leukocytes,Ua: NEGATIVE
Nitrite: NEGATIVE
Protein, ur: NEGATIVE mg/dL
Specific Gravity, Urine: 1.025 (ref 1.005–1.030)
pH: 5 (ref 5.0–8.0)

## 2023-11-06 LAB — COMPREHENSIVE METABOLIC PANEL WITH GFR
ALT: 23 U/L (ref 0–44)
AST: 18 U/L (ref 15–41)
Albumin: 3.7 g/dL (ref 3.5–5.0)
Alkaline Phosphatase: 40 U/L (ref 38–126)
Anion gap: 10 (ref 5–15)
BUN: 23 mg/dL (ref 8–23)
CO2: 24 mmol/L (ref 22–32)
Calcium: 8.8 mg/dL — ABNORMAL LOW (ref 8.9–10.3)
Chloride: 104 mmol/L (ref 98–111)
Creatinine, Ser: 1.09 mg/dL (ref 0.61–1.24)
GFR, Estimated: >60 mL/min (ref >60)
Glucose, Bld: 103 mg/dL — ABNORMAL HIGH (ref 70–99)
Potassium: 4.3 mmol/L (ref 3.5–5.1)
Sodium: 138 mmol/L (ref 135–145)
Total Bilirubin: 0.9 mg/dL (ref 0.0–1.2)
Total Protein: 7 g/dL (ref 6.5–8.1)

## 2023-11-06 LAB — LIPASE, BLOOD: Lipase: 32 U/L (ref 11–51)

## 2023-11-06 MED ORDER — ONDANSETRON HCL 4 MG/2ML IJ SOLN
4.0000 mg | Freq: Once | INTRAMUSCULAR | Status: AC
  Administered 2023-11-06: 4 mg via INTRAVENOUS
  Filled 2023-11-06: qty 2

## 2023-11-06 MED ORDER — FENTANYL CITRATE (PF) 100 MCG/2ML IJ SOLN
100.0000 ug | Freq: Once | INTRAMUSCULAR | Status: AC
  Administered 2023-11-06: 100 ug via INTRAVENOUS
  Filled 2023-11-06: qty 2

## 2023-11-06 MED ORDER — IOHEXOL 300 MG/ML  SOLN
100.0000 mL | Freq: Once | INTRAMUSCULAR | Status: AC | PRN
  Administered 2023-11-06: 100 mL via INTRAVENOUS

## 2023-11-06 NOTE — ED Triage Notes (Signed)
 Pt c/o back pain, recently diagnosed with pulled muscle. States that has gotten worse since. Not able to move certain ways and pain goes all the way across back.

## 2023-11-06 NOTE — ED Notes (Signed)
 ED Provider at bedside.

## 2023-11-06 NOTE — ED Notes (Signed)
 Pt drowsy after giving medication. [name] O2 sat dropped to 88%. Placed pt on 2L [place] with O2 sat returning to 93%

## 2023-11-06 NOTE — Discharge Instructions (Addendum)
 Please follow-up with Ricky Blackwell in about 1 week.  I recommend obtaining MRI thoracic and lumbar spine due to your back pain for weeks.  See below for reasons to go back to the ER for reassessment  SEEK IMMEDIATE MEDICAL ATTENTION IF: New numbness, tingling, weakness, or problem with the use of your arms or legs.  Severe back pain not relieved with medications.  Change in bowel or bladder control (if you lose control of stool or urine, or if you are unable to urinate) Increasing pain in any areas of the body (such as chest or abdominal pain).  Shortness of breath, dizziness or fainting.  Nausea (feeling sick to your stomach), vomiting, fever, or sweats.

## 2023-11-06 NOTE — ED Provider Notes (Signed)
 [place] EMERGENCY DEPARTMENT AT [facility] Provider Note   CSN: [id number] Arrival date & time: 11/06/23  9887     Patient presents with muscle strain   Ricky Blackwell is a 63 y.o. male.   The history is provided by the patient.  Patient w/history of diabetes, hypertension, Crohn's disease presents for possible muscle strain Patient reports around 5 weeks ago he saw his PCP for back pain and reports he had x-rays that were performed and was informed that he had a muscle strain  Since that time the pain is worsened.  Tonight the pain has significantly worsened and is mostly his right upper quadrant of his abdomen and his right flank.  At times it radiates throughout his abdomen.  No fevers or vomiting.  No chest pain or shortness of breath.  No arm or leg weakness.  No incontinence.  He reports previous history of kidney stone but this is much worse  No falls or trauma are reported Past Medical History:  Diagnosis Date   Anal stenosis    Anginal pain (HCC)    Arthritis    Colitis    COVID-19 04/2020   Crohn's disease (HCC) 1993   Diabetes mellitus without complication (HCC)    H/O hiatal hernia    History of kidney stones    Hypertension    Irritable bowel syndrome    Pneumonia    hx   Rectal bleeding    Unspecified constipation     Prior to Admission medications   Medication Sig Start Date End Date Taking? Authorizing Provider  amLODipine  (NORVASC ) 2.5 MG tablet TAKE ONE (1) TABLET BY MOUTH EVERY DAY 07/05/22   Delford Maude BROCKS, MD  aspirin  EC 81 MG tablet Take 81 mg by mouth daily. Swallow whole.    [provider]  cholecalciferol (VITAMIN D3) 25 MCG (1000 UNIT) tablet Take 1,000 Units by mouth daily.    [provider]  clotrimazole -betamethasone  (LOTRISONE ) cream Apply 1 Application topically 2 (two) times daily. Apply to groin and buttocks area twice daily 10/25/23   Sandridge, Brenda K, PA-C  diphenhydrAMINE  (BENADRYL  ALLERGY) 25 mg  capsule Take 25 mg by mouth every 6 (six) hours as needed for itching.    [provider]  fluconazole  (DIFLUCAN ) 200 MG tablet Take 1 tablet (200 mg total) by mouth once a week. 10/25/23   Sandridge, Erminio POUR, PA-C  HUMIRA , 2 PEN, 40 MG/0.4ML pen INJECT 1 PEN UNDER THE SKIN EVERY 14 DAYS (ON WEDNESDAYS) 11/02/22   [name], Lamar HERO, MD  hydrochlorothiazide (MICROZIDE) 12.5 MG capsule Take 12.5 mg by mouth daily.    [provider]  lisinopril (PRINIVIL,ZESTRIL) 10 MG tablet Take 10 mg by mouth daily.  03/23/17   [provider]  Loratadine 10 MG CAPS Take 10 mg by mouth daily.    [provider]  metFORMIN (GLUCOPHAGE) 500 MG tablet Take 500 mg by mouth daily with supper.    [provider]  nitroGLYCERIN  (NITROSTAT ) 0.4 MG SL tablet Place 1 tablet (0.4 mg total) under the tongue every 5 (five) minutes as needed for chest pain. 10/14/20 10/03/23  Delford Maude BROCKS, MD  omeprazole  (PRILOSEC) 40 MG capsule TAKE ONE CAPSULE BY MOUTH DAILY 10/25/23   [name], Lamar HERO, MD  sulfamethoxazole -trimethoprim  (BACTRIM  DS) 800-160 MG tablet Take 1 tablet by mouth 2 (two) times daily for 7 days. 10/30/23 11/06/23  Sandridge, Brenda K, PA-C    Allergies: Ketorolac and Toradol [ketorolac tromethamine]  Review of Systems  Respiratory:  Negative for shortness of breath.   Cardiovascular:  Negative for chest pain.  Gastrointestinal:  Positive for abdominal pain.  Musculoskeletal:  Positive for back pain.  Neurological:  Negative for weakness.    Updated Vital Signs BP 127/66   Pulse (!) 57   Temp 98.4 F (36.9 C)   Resp 13   SpO2 94%   Physical Exam CONSTITUTIONAL: Well developed/well nourished, anxious HEAD: Normocephalic/atraumatic EYES: EOMI/PERRL ENMT: Mucous membranes moist NECK: supple no meningeal signs SPINE/BACK: Mild pinpoint mid-thoracic tenderness No bruising/crepitance/stepoffs noted to spine CV: S1/S2 noted, no murmurs/rubs/gallops noted LUNGS:  Lungs are clear to auscultation bilaterally, no apparent distress ABDOMEN: soft, moderate RUQ tenderness, no rebound or guarding GU: Right cva tenderness NEURO: Awake/alert,  equal motor 5/5 strength noted with the following: hip flexion/knee flexion/extension, foot dorsi/plantar flexion, great toe extension intact bilaterally, no clonus bilaterally,  no sensory deficit in any dermatome.  EXTREMITIES: pulses normal and equal in both feet, full ROM SKIN: warm, color normal PSYCH: Anxious  (all labs ordered are listed, but only abnormal results are displayed) Labs Reviewed  COMPREHENSIVE METABOLIC PANEL WITH GFR - Abnormal; Notable for the following components:      Result Value   Glucose, Bld 103 (*)    Calcium  8.8 (*)    All other components within normal limits  CBC WITH DIFFERENTIAL/PLATELET  LIPASE, BLOOD  URINALYSIS, ROUTINE W REFLEX MICROSCOPIC    EKG: EKG Interpretation Date/Time:  Monday November 06 2023 03:54:11 EDT Ventricular Rate:  55 PR Interval:  157 QRS Duration:  121 QT Interval:  416 QTC Calculation: 398 R Axis:   -48  Text Interpretation: Sinus rhythm RBBB and LAFB No significant change since last tracing Confirmed by Midge Golas ([id number]) on 11/06/2023 4:01:08 AM  Radiology: CT ABDOMEN PELVIS W CONTRAST Result Date: 11/06/2023 EXAM: CT ABDOMEN AND PELVIS WITH CONTRAST 11/06/2023 04:53:22 AM TECHNIQUE: CT of the abdomen and pelvis was performed with the administration of intravenous contrast. Multiplanar reformatted images are provided for review. Automated exposure control, iterative reconstruction, and/or weight based adjustment of the mA/kV was utilized to reduce the radiation dose to as low as reasonably achievable. COMPARISON: CT renal stone study 06/30/2020. CLINICAL HISTORY: Flank, RUQ pain. Patient complains of back pain, recently diagnosed with pulled muscle. States that it has gotten worse since. Not able to move certain ways and pain goes all the way across  back. FINDINGS: LOWER CHEST: No acute abnormality. LIVER: Diffuse fatty infiltration of the liver is present. No discrete lesions are present. GALLBLADDER AND BILE DUCTS: Gallbladder is unremarkable. No biliary ductal dilatation. SPLEEN: No acute abnormality. PANCREAS: No acute abnormality. ADRENAL GLANDS: No acute abnormality. KIDNEYS, URETERS AND BLADDER: A 2.4 cm exophytic simple cyst anteriorly in the left kidney is stable in size. No stones in the kidneys or ureters. No hydronephrosis. No perinephric or periureteral stranding. Urinary bladder is unremarkable. 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 Recommend no follow up imaging for this lesion Diverticular changes are present in the descending and sigmoid colon without focal inflammation. There is no bowel obstruction. No bowel wall thickening. PERITONEUM AND RETROPERITONEUM: No ascites. No free air. VASCULATURE: Atherosclerotic calcifications are present in the distal abdominal aorta and branch vessels without aneurysm. LYMPH NODES: No lymphadenopathy. REPRODUCTIVE ORGANS: No acute abnormality. BONES AND SOFT TISSUES: Multilevel degenerative changes of lumbar spine are greatest at L3-4 and L5 to S1. Fat herniates into the inguinal canals bilaterally. No  associated bowel. IMPRESSION: 1. No acute findings in the abdomen or pelvis related to the clinical history of flank and RUQ pain. 2. 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 3. Diverticular changes in the descending and sigmoid colon without focal inflammation. Electronically signed by: Lonni Necessary MD 11/06/2023 05:03 AM EDT RP Workstation: [id number]   DG Chest Portable 1 View Result Date: 11/06/2023 EXAM: 1 VIEW XRAY OF THE CHEST 11/06/2023 04:00:00 AM COMPARISON: 02/23/2023 CLINICAL HISTORY: Pain. Per triage back pain, recently diagnosed with pulled muscle. States that has gotten worse since. Not able to move certain ways and pain  goes all the way across back. FINDINGS: LUNGS AND PLEURA: Mild pulmonary vascular congestion is present. No focal pulmonary opacity. No pulmonary edema. No pleural effusion. No pneumothorax. HEART AND MEDIASTINUM: No acute abnormality of the cardiac and mediastinal silhouettes. BONES AND SOFT TISSUES: No acute osseous abnormality. IMPRESSION: 1. Mild pulmonary vascular congestion. Electronically signed by: Lonni Necessary MD 11/06/2023 04:05 AM EDT RP Workstation: [id number]     Procedures   Medications Ordered in the ED  fentaNYL  (SUBLIMAZE ) injection 100 mcg (100 mcg Intravenous Given 11/06/23 0316)  ondansetron  (ZOFRAN ) injection 4 mg (4 mg Intravenous Given 11/06/23 0316)  iohexol  (OMNIPAQUE ) 300 MG/ML solution 100 mL (100 mLs Intravenous Contrast Given 11/06/23 0445)    Clinical Course as of 11/06/23 0543  Mon Nov 06, 2023  0314 Patient reports he was diagnosed with muscle strain about 5 weeks ago but pain is continued.  On my exam most of the tenderness is elicited in the right upper quadrant and his abdominal exam and right flank Patient appears very uncomfortable, differential is extensive We will treat pain, check labs and reassess.  Anticipate CT abdomen pelvis [DW]  0349 Patient feels improved but still uncomfortable at times.  Denies feeling having chest pain or shortness of breath, but has pain when he breathes.  Patient is a challenging historian  Will obtain chest x-ray and EKG [DW]  [phone] Patient has had extensive workup but has been unremarkable.  Labs and urinalysis overall reassuring Initially his pain appeared to be abdominal in nature and CT abdomen pelvis which was performed-this revealed chronic findings but no acute issues. On reassessment patient feels more comfortable but does have pain with movement of his upper body as well as movement of his legs.  Now appears to be emanating from the middle of his back. He admits this has been ongoing for weeks.  He denies any leg  weakness, denies any urinary or fecal incontinence He reports he is ambulatory.  He already has narcotics and muscle relaxers at home which he is reluctant to take [DW]  0539 At this time is no signs of acute emergency. He has no chest pain.  He has mid back pain that is clearly reproducible with palpation and movement Low suspicion for PE/ACS/dissection [DW]  [phone] Patient was advised to follow-up closely with his PCP.  He may require MRI thoracic or lumbar spine if this pain continues in his back, though there is no emergent indication at this time.  We discussed strict ER return precautions [DW]  0540 Of note, patient did have brief drops in his pulse ox while sleeping, but this is improved.  Room air pulse ox when awake is above 95%.  Lung sounds are clear, low suspicion for CHF or PE [DW]    Clinical Course User Index [DW] Midge Golas, MD  Medical Decision Making Amount and/or Complexity of Data Reviewed Labs: ordered. Radiology: ordered. ECG/medicine tests: ordered.  Risk Prescription drug management.   This patient presents to the ED for concern of back pain, abdominal pain, this involves an extensive number of treatment options, and is a complaint that carries with it a high risk of complications and morbidity.  The differential diagnosis includes but is not limited to cholecystitis, cholelithiasis, pancreatitis, gastritis, peptic ulcer disease, appendicitis, bowel obstruction, bowel perforation, diverticulitis, AAA, ischemic bowel   Comorbidities that complicate the patient evaluation: Patient's presentation is complicated by their history of diabetes, obesity  Social Determinants of Health: Patient's former tobacco use  increases the complexity of managing their presentation  Additional history obtained: Records reviewed outpatient records reviewed  Lab Tests: I Ordered, and personally interpreted labs.  The pertinent results include:  Labs unremarkable  Imaging Studies ordered: I ordered imaging studies including CT scan abdomen pelvis  I independently visualized and interpreted imaging which showed no acute findings I agree with the radiologist interpretation  Cardiac Monitoring: The patient was maintained on a cardiac monitor.  I personally viewed and interpreted the cardiac monitor which showed an underlying rhythm of:  sinus rhythm  Medicines ordered and prescription drug management: I ordered medication including fentanyl  for analgesia Reevaluation of the patient after these medicines showed that the patient    improved  Test Considered: I considered MRI thoracic and lumbar spine, but since patient is had symptoms for weeks, and no acute neurologic deficits will defer at this time  Reevaluation: After the interventions noted above, I reevaluated the patient and found that they have :improved  Complexity of problems addressed: Patient's presentation is most consistent with  acute presentation with potential threat to life or bodily function  Disposition: After consideration of the diagnostic results and the patient's response to treatment,  I feel that the patent would benefit from discharge  .        Final diagnoses:  Right flank pain    ED Discharge Orders     None          Midge Golas, MD 11/06/23 [phone]

## 2023-11-14 ENCOUNTER — Ambulatory Visit: Admitting: Physician Assistant

## 2023-11-14 ENCOUNTER — Encounter: Payer: Self-pay | Admitting: Physician Assistant

## 2023-11-14 ENCOUNTER — Telehealth: Payer: Self-pay

## 2023-11-14 DIAGNOSIS — Z872 Personal history of diseases of the skin and subcutaneous tissue: Secondary | ICD-10-CM | POA: Diagnosis not present

## 2023-11-14 DIAGNOSIS — Z09 Encounter for follow-up examination after completed treatment for conditions other than malignant neoplasm: Secondary | ICD-10-CM

## 2023-11-14 DIAGNOSIS — L308 Other specified dermatitis: Secondary | ICD-10-CM

## 2023-11-14 NOTE — Progress Notes (Signed)
   Follow Up Visit   Subjective  Ricky Blackwell is a 63 y.o. male who presents for follow up of ongoing rash. He underwent 2 (two) punch biopsies. Last one confirmed psoriasiform and spongiotic dermatitis. Aerobic culture also confirmed MRSA that has been treated with Bactrim .   He is very happy to report that his rash and the itching are completely resolved.   He is under the care of GI and has been on Humira  for years for Crohn's disease. This rash was noticed during a recent colonoscopy. He was referred to rule out psoriasis.    The following portions of the chart were reviewed this encounter and updated as appropriate: medications, allergies, medical history  Review of Systems:  No other skin or systemic complaints except as noted in HPI or Assessment and Plan.  Objective  Well appearing patient in no apparent distress; mood and affect are within normal limits.  A focused examination was performed of the following areas: Abdomen/groin (genitals), buttocks   Relevant exam findings are noted in the Assessment and Plan.    Assessment & Plan   SPONGIOTIC PSORIASIFORM DERMATITIS    RASH - psoriasiform and spongiotic dermatitis  Exam: Resolved   Treatment Plan: - Stop Clotrimisole-Betamethasone   - Reviewed the 2nd pathology with the patient and explained that psoriasiform (and spongiotic) dermatitis does not necessarily mean he has psoriasis. In fact, clinically I do not think he has psoriasis. It could have even been some sort of contact dermatitis that ultimately got infected (MRSA). PAS-f stain was negative for fungus. He did have questions as to the cleanliness of the bathroom facilities at his workplace. I offered him some suggestions for his work settings. He is using some clean products at home to include All Free and Clear detergent as well as Lever soap. He is very happy the itching is gone. He knows to call if this rash returns for re-evaluation.   CC: Ricky Charleston,  MD - Gastroenterology    Return if symptoms worsen or fail to improve.  I, Gordan Beams, CMA, am acting as scribe for Senna Lape K, PA-C.   Documentation: I have reviewed the above documentation for accuracy and completeness, and I agree with the above.  Dessie Delcarlo K, PA-C

## 2023-11-14 NOTE — Telephone Encounter (Signed)
 Pt called stating that he seen dermatology today and that they will be sending over a copy of the office visit notes so that you can determine what to do regarding the pt's crohn's medication.

## 2023-11-24 NOTE — Telephone Encounter (Signed)
 Pt called to follow up on this. The office visit note is complete in Epic from the dermatologist. Pt is wanting to know what medication he will be using for his Crohn's. Please advise.

## 2023-11-28 ENCOUNTER — Other Ambulatory Visit: Payer: Self-pay

## 2023-11-28 DIAGNOSIS — K5090 Crohn's disease, unspecified, without complications: Secondary | ICD-10-CM

## 2023-11-28 NOTE — Telephone Encounter (Signed)
 No ans, vm full. Pt will need to update td gold.

## 2023-11-29 NOTE — Telephone Encounter (Signed)
 Pt was made aware and will be going today to have his tb test done.

## 2023-11-29 NOTE — Addendum Note (Signed)
 Addended by: WELLINGTON MILLING on: 11/29/2023 01:45 PM   Modules accepted: Orders

## 2023-12-02 LAB — QUANTIFERON-TB GOLD PLUS
Mitogen-NIL: >10 [IU]/mL
NIL: 0.01 [IU]/mL
QuantiFERON-TB Gold Plus: NEGATIVE
TB1-NIL: 0 [IU]/mL
TB2-NIL: 0 [IU]/mL

## 2023-12-03 ENCOUNTER — Ambulatory Visit: Payer: Self-pay | Admitting: Internal Medicine

## 2023-12-04 NOTE — Telephone Encounter (Signed)
 Waiting on signed orders, will make pt aware.

## 2024-01-03 ENCOUNTER — Encounter: Payer: Self-pay | Admitting: Neurology

## 2024-01-17 ENCOUNTER — Ambulatory Visit: Admitting: Neurology

## 2024-01-17 ENCOUNTER — Encounter: Payer: Self-pay | Admitting: Neurology

## 2024-01-17 VITALS — BP 135/77 | HR 67 | Ht 70.0 in | Wt 294.0 lb

## 2024-01-17 DIAGNOSIS — I639 Cerebral infarction, unspecified: Secondary | ICD-10-CM

## 2024-01-17 DIAGNOSIS — I10 Essential (primary) hypertension: Secondary | ICD-10-CM

## 2024-01-17 NOTE — Progress Notes (Signed)
 NEUROLOGY CONSULTATION NOTE  Ricky Blackwell MRN: [id number] DOB: 07-22-1960  Referring provider: Dorothey Cassette, NP Primary care provider: Dorothey Cassette, NP  Reason for consult:  stroke  Assessment/Plan:   Small left posterior centrum semiovale infarct, likely secondary to small vessel disease Hypertension Type 2 diabetes mellitus    Secondary stroke prevention as managed by PCP: ASA 81mg  daily Normotensive blood pressure Statin.  LDL goal less than 70 Hgb J8r goal less than 7 Lifestyle modification: Mediterranean diet Routine exercise Follow up 6 months.   Subjective:  Ricky Blackwell is a 63 year old right-handed male with HTN, DM 2, Crohn's disease and history of kidney stones who presents for stroke.  History supplemented by hospital records.    On 01/01/2024, he developed numbness and tingling of his left forearm.  The last three fingers of his left hand started to curl.  He went to [facility].  CT and CTA head and neck revealed no acute intracranial abnormality, LVO or hemodynamically significant stenosis.  MRI of brain revealed small acute infarct in the posterior right centrum semiovale.  TTE with bubble study revealed LVEF 60-65% and grade I diastolic dysfunction with no evidence of thrombus or valvular disease but unable to adequately assess for intracardiac shunt on bubble study due to limited image quality.  LDL was 103 and Hgb A1c was 5.7.  PT/OT found him independent with ADLs, transfers and ambulation and therefore no need for ongoing skilled therapy services.  Bedside dysphagia screening was normal.  He is now back to baseline. He just mailed in his Zio patch monitor yesterday.        PAST MEDICAL HISTORY: Past Medical History:  Diagnosis Date   Anal stenosis    Anginal pain    Arthritis    Colitis    COVID-19 04/2020   Crohn's disease (HCC) 1993   Diabetes mellitus without complication (HCC)    H/O hiatal hernia    History of kidney  stones    Hypertension    Irritable bowel syndrome    Pneumonia    hx   Rectal bleeding    Unspecified constipation     PAST SURGICAL HISTORY: Past Surgical History:  Procedure Laterality Date   ABDOMINAL HERNIA REPAIR  2010   BACK SURGERY  2000   BIOPSY  05/21/2020   Procedure: BIOPSY;  Surgeon: Shaaron Lamar HERO, MD;  Location: [facility];  Service: Endoscopy;;   CARDIAC CATHETERIZATION     COLONOSCOPY  05/2004   Dr. [facility]EN[facility]OD [facility]- crohns    COLONOSCOPY  08/20/2008   Dr. Shaaron- normal distal rectum, proximal rectum, scattered 1-2 mm aphthous ulcers, these extended into the distal sigmoid.   COLONOSCOPY N/A 10/03/2014   [name]: findings c/w mildly active ileocolonic crohn's with scattered aphthous ulcers in the distal sigmoid colon, descending colon, ascending colon, terminal ileum. Transverse colon was normal.   COLONOSCOPY N/A 10/09/2023   Procedure: COLONOSCOPY;  Surgeon: Shaaron Lamar HERO, MD;  Location: [facility];  Service: Endoscopy;  Laterality: N/A;  230pm, ok rm 1-2   COLONOSCOPY WITH PROPOFOL  N/A 05/25/2017   Procedure: COLONOSCOPY WITH PROPOFOL ;  Surgeon: Shaaron Lamar HERO, MD;  scattered ulcers and erythema involving the rectum colon and terminal ileum consistent with Crohn's disease, one benign 4 mm sessile polyp, s/p frequent segmental biopsies.  Pathology with chronic mildly active ileitis and mildly active colitis in ascending colon, sigmoid colon, and rectum.  Repeat in 2 years.   COLONOSCOPY WITH PROPOFOL  N/A 05/21/2020  Surgeon: Shaaron Lamar HERO, MD;  IBD with skipped involvement of the colon endoscopically consistent with Crohn's colitis s/p segmental biopsy.  Normal-appearing distal TI, ulcerated ileocecal valve.  Pathology with mildly active chronic colitis in the ileocecal valve, moderately active chronic colitis in the descending colon, severely active chronic colitis with ulceration in the sigmoid colon.    ESOPHAGOGASTRODUODENOSCOPY (EGD) WITH PROPOFOL  N/A  02/05/2021   Procedure: ESOPHAGOGASTRODUODENOSCOPY (EGD) WITH PROPOFOL ;  Surgeon: Shaaron Lamar HERO, MD;  Location: [facility];  Service: Endoscopy;  Laterality: N/A;  10:45am   JOINT REPLACEMENT Left    knee   KNEE SURGERY Right 2010   Left Arthoscopic   LEFT HEART CATH AND CORONARY ANGIOGRAPHY N/A 10/27/2020   Procedure: LEFT HEART CATH AND CORONARY ANGIOGRAPHY;  Surgeon: Anner Alm ORN, MD;  Location: [facility] INVASIVE CV LAB;  Service: Cardiovascular;  Laterality: N/A;   MALONEY DILATION N/A 02/05/2021   Procedure: AGAPITO DILATION;  Surgeon: Shaaron Lamar HERO, MD;  Location: [facility];  Service: Endoscopy;  Laterality: N/A;   NECK SURGERY     NECK SURGERY N/A 2005   metal plate in neck   POLYPECTOMY  05/25/2017   Procedure: POLYPECTOMY;  Surgeon: Shaaron Lamar HERO, MD;  Location: [facility];  Service: Endoscopy;;  Sigmoid colon (CS)   TOTAL KNEE ARTHROPLASTY Left 02/22/2013   Procedure: LEFT TOTAL KNEE ARTHROPLASTY;  Surgeon: ORN JONETTA Shari Mickey., MD;  Location: [facility];  Service: Orthopedics;  Laterality: Left;    MEDICATIONS: Current Outpatient Medications on File Prior to Visit  Medication Sig Dispense Refill   amLODipine  (NORVASC ) 2.5 MG tablet TAKE ONE (1) TABLET BY MOUTH EVERY DAY 7 tablet 0   aspirin  EC 81 MG tablet Take 81 mg by mouth daily. Swallow whole.     cholecalciferol (VITAMIN D3) 25 MCG (1000 UNIT) tablet Take 1,000 Units by mouth daily.     clotrimazole -betamethasone  (LOTRISONE ) cream Apply 1 Application topically 2 (two) times daily. Apply to groin and buttocks area twice daily 90 g 1   diphenhydrAMINE  (BENADRYL  ALLERGY) 25 mg capsule Take 25 mg by mouth every 6 (six) hours as needed for itching.     fluconazole  (DIFLUCAN ) 200 MG tablet Take 1 tablet (200 mg total) by mouth once a week. 3 tablet 0   HUMIRA , 2 PEN, 40 MG/0.4ML pen INJECT 1 PEN UNDER THE SKIN EVERY 14 DAYS (ON WEDNESDAYS) 2 each 11   hydrochlorothiazide (MICROZIDE) 12.5 MG capsule Take 12.5 mg by mouth  daily.     lisinopril (PRINIVIL,ZESTRIL) 10 MG tablet Take 10 mg by mouth daily.   0   Loratadine 10 MG CAPS Take 10 mg by mouth daily.     metFORMIN (GLUCOPHAGE) 500 MG tablet Take 500 mg by mouth daily with supper.     nitroGLYCERIN  (NITROSTAT ) 0.4 MG SL tablet Place 1 tablet (0.4 mg total) under the tongue every 5 (five) minutes as needed for chest pain. 25 tablet 3   omeprazole  (PRILOSEC) 40 MG capsule TAKE ONE CAPSULE BY MOUTH DAILY 30 capsule 11   No current facility-administered medications on file prior to visit.    ALLERGIES: Allergies  Allergen Reactions   Ketorolac     Other Reaction(s): Toradol- Crohn's flare   Toradol [Ketorolac Tromethamine] Other (See Comments)    Told to avoid due to Crohn's disease    FAMILY HISTORY: Family History  Problem Relation Age of Onset   Diabetes Mother    Heart failure Mother    Heart failure Father  Colon polyps Father    Cancer - Lung Father    COPD Sister    Diabetes type II Sister    Hypertension Sister    Sleep apnea Sister    Arthritis Sister    Colitis Sister        Maybe?   Irregular heart beat Brother    Alcoholism Brother    Arthritis Brother    Colon cancer Neg Hx    Inflammatory bowel disease Neg Hx     Objective:  Blood pressure 135/77, pulse 67, height 5' 10 (1.778 m), weight 294 lb (133.4 kg), SpO2 97%. General: No acute distress.  Patient appears well-groomed.   Head:  Normocephalic/atraumatic Eyes:  fundi examined but not visualized Neck: supple, no paraspinal tenderness, full range of motion Heart: regular rate and rhythm Neurological Exam: Mental status: alert and oriented to person, place, and time, speech fluent and not dysarthric, language intact. Cranial nerves: CN I: not tested CN II: pupils equal, round and reactive to light, visual fields intact CN III, IV, VI:  full range of motion, no nystagmus, no ptosis CN V: facial sensation intact. CN VII: upper and lower face symmetric CN VIII:  hearing intact CN IX, X: gag intact, uvula midline CN XI: sternocleidomastoid and trapezius muscles intact CN XII: tongue midline Bulk & Tone: normal, no fasciculations. Motor:  muscle strength 5/5 throughout Sensation:  Pinprick and vibratory sensation intact. Deep Tendon Reflexes:  2+ throughout,  toes downgoing.   Finger to nose testing:  Without dysmetria.   Gait:  Normal station and stride.  Romberg negative.    Thank you for allowing me to take part in the care of this patient.  Juliene Dunnings, DO  CC: Dorothey Cassette, NP

## 2024-01-17 NOTE — Patient Instructions (Signed)
 Continue aspirin  81mg  daily Continue atorvastatin for cholesterol Blood pressure control Blood sugar control Follow up one year    Mediterranean Diet A Mediterranean diet is based on the traditions of countries on the [employer]. It focuses on eating more: Fruits and vegetables. Whole grains, beans, nuts, and seeds. Heart-healthy fats. These are fats that are good for your heart. It involves eating less: Dairy. Meat and eggs. Processed foods with added sugar, salt, and fat. This type of diet can help prevent certain conditions. It can also improve outcomes if you have a long-term (chronic) disease, such as kidney or heart disease. What are tips for following this plan? Reading food labels Check packaged foods for: The serving size. For foods such as rice and pasta, the serving size is the amount of cooked product, not dry. The total fat. Avoid foods with saturated fat or trans fat. Added sugars, such as corn syrup. Shopping  Try to have a balanced diet. Buy a variety of foods, such as: Fresh fruits and vegetables. You may be able to get these from local farmers markets. You can also buy them frozen. Grains, beans, nuts, and seeds. Some of these can be bought in bulk. Fresh seafood. Poultry and eggs. Low-fat dairy products. Buy whole ingredients instead of foods that have already been packaged. If you can't get fresh seafood, buy precooked frozen shrimp or canned fish, such as tuna, salmon, or sardines. Stock your pantry so you always have certain foods on hand, such as olive oil, canned tuna, canned tomatoes, rice, pasta, and beans. Cooking Cook foods with extra-virgin olive oil instead of using butter or other vegetable oils. Have meat as a side dish. Have vegetables or grains as your main dish. This means having meat in small portions or adding small amounts of meat to foods like pasta or stew. Use beans or vegetables instead of meat in common dishes like chili or  lasagna. Try out different cooking methods. Try roasting, broiling, steaming, and sauting vegetables. Add frozen vegetables to soups, stews, pasta, or rice. Add nuts or seeds for added healthy fats and plant protein at each meal. You can add these to yogurt, salads, or vegetable dishes. Marinate fish or vegetables using olive oil, lemon juice, garlic, and fresh herbs. Meal planning Plan to eat a vegetarian meal one day each week. Try to work up to two vegetarian meals, if possible. Eat seafood two or more times a week. Have healthy snacks on hand. These may include: Vegetable sticks with hummus. Greek yogurt. Fruit and nut trail mix. Eat balanced meals. These should include: Fruit: 2-3 servings a day. Vegetables: 4-5 servings a day. Low-fat dairy: 2 servings a day. Fish, poultry, or lean meat: 1 serving a day. Beans and legumes: 2 or more servings a week. Nuts and seeds: 1-2 servings a day. Whole grains: 6-8 servings a day. Extra-virgin olive oil: 3-4 servings a day. Limit red meat and sweets to just a few servings a month. Lifestyle  Try to cook and eat meals with your family. Drink enough fluid to keep your pee (urine) pale yellow. Be active every day. This includes: Aerobic exercise, which is exercise that causes your heart to beat faster. Examples include running and swimming. Leisure activities like gardening, walking, or housework. Get 7-8 hours of sleep each night. Drink red wine if your provider says you can. A glass of wine is 5 oz (150 mL). You may be allowed to have: Up to 1 glass a day if you're male  and not pregnant. Up to 2 glasses a day if you're male. What foods should I eat? Fruits Apples. Apricots. Avocado. Berries. Bananas. Cherries. Dates. Figs. Grapes. Lemons. Melon. Oranges. Peaches. Plums. Pomegranate. Vegetables Artichokes. Beets. Broccoli. Cabbage. Carrots. Eggplant. Green beans. Chard. Kale. Spinach. Onions. Leeks. Peas. Squash. Tomatoes. Peppers.  Radishes. Grains Whole-grain pasta. Brown rice. Bulgur wheat. Polenta. Couscous. Whole-wheat bread. [place] Madeira. Meats and other proteins Beans. Almonds. Sunflower seeds. Pine nuts. Peanuts. Cod. Salmon. Scallops. Shrimp. Tuna. Tilapia. Clams. Oysters. Eggs. Chicken or [place] without skin. Dairy Low-fat milk. Cheese. Greek yogurt. Fats and oils Extra-virgin olive oil. Avocado oil. Grapeseed oil. Beverages Water . Red wine. Herbal tea. Sweets and desserts Greek yogurt with honey. Baked apples. Poached pears. Trail mix. Seasonings and condiments Basil. Cilantro. Coriander. Cumin. Mint. Parsley. Sage. Rosemary. Tarragon. Garlic. Oregano. Thyme. Pepper. Balsamic vinegar. Tahini. Hummus. Tomato sauce. Olives. Mushrooms. The items listed above may not be all the foods and drinks you can have. Talk to a dietitian to learn more. What foods should I limit? This is a list of foods that should be eaten rarely. Fruits Fruit canned in syrup. Vegetables Deep-fried potatoes, like [place] fries. Grains Packaged pasta or rice dishes. Cereal with added sugar. Snacks with added sugar. Meats and other proteins Beef. Pork. Lamb. Chicken or [place] with skin. Hot dogs. [place]. Dairy Ice cream. Sour cream. Whole milk. Fats and oils Butter. Canola oil. Vegetable oil. Beef fat (tallow). Lard. Beverages Juice. Sugar-sweetened soft drinks. Beer. Liquor and spirits. Sweets and desserts Cookies. Cakes. Pies. Candy. Seasonings and condiments Mayonnaise. Pre-made sauces and marinades. The items listed above may not be all the foods and drinks you should limit. Talk to a dietitian to learn more. Where to find more information American Heart Association (AHA): heart.org This information is not intended to replace advice given to you by your health care provider. Make sure you discuss any questions you have with your health care provider. Document Revised: 07/10/2022 Document Reviewed: 07/10/2022 Elsevier  Patient Education  2024 [employer].

## 2024-01-25 ENCOUNTER — Encounter: Payer: Self-pay | Admitting: Internal Medicine

## 2024-03-26 ENCOUNTER — Other Ambulatory Visit: Payer: Self-pay

## 2024-03-26 MED ORDER — SKYRIZI 180 MG/1.2ML ~~LOC~~ SOCT: 180.0000 mg | SUBCUTANEOUS | 6 refills | Status: AC

## 2024-08-14 ENCOUNTER — Ambulatory Visit: Admitting: Neurology
